# Patient Record
Sex: Female | Born: 1985 | Race: White | Hispanic: No | Marital: Married | Smoking: Former smoker
Health system: Southern US, Community
[De-identification: ages and names within clinical notes are randomized; demographics above are authoritative.]

## PROBLEM LIST (undated history)

## (undated) DIAGNOSIS — T8859XA Other complications of anesthesia, initial encounter: Secondary | ICD-10-CM

## (undated) DIAGNOSIS — K219 Gastro-esophageal reflux disease without esophagitis: Secondary | ICD-10-CM

## (undated) DIAGNOSIS — Z87440 Personal history of urinary (tract) infections: Secondary | ICD-10-CM

## (undated) DIAGNOSIS — Z803 Family history of malignant neoplasm of breast: Secondary | ICD-10-CM

## (undated) DIAGNOSIS — Z8042 Family history of malignant neoplasm of prostate: Secondary | ICD-10-CM

## (undated) DIAGNOSIS — N809 Endometriosis, unspecified: Secondary | ICD-10-CM

## (undated) DIAGNOSIS — Z9889 Other specified postprocedural states: Secondary | ICD-10-CM

## (undated) DIAGNOSIS — R112 Nausea with vomiting, unspecified: Secondary | ICD-10-CM

## (undated) DIAGNOSIS — J45909 Unspecified asthma, uncomplicated: Secondary | ICD-10-CM

## (undated) DIAGNOSIS — Z8759 Personal history of other complications of pregnancy, childbirth and the puerperium: Secondary | ICD-10-CM

## (undated) DIAGNOSIS — T4145XA Adverse effect of unspecified anesthetic, initial encounter: Secondary | ICD-10-CM

## (undated) HISTORY — PX: ENDOMETRIAL ABLATION: SHX621

## (undated) HISTORY — DX: Family history of malignant neoplasm of prostate: Z80.42

## (undated) HISTORY — PX: WISDOM TOOTH EXTRACTION: SHX21

## (undated) HISTORY — PX: MANDIBLE FRACTURE SURGERY: SHX706

## (undated) HISTORY — DX: Family history of malignant neoplasm of breast: Z80.3

## (undated) HISTORY — PX: OTHER SURGICAL HISTORY: SHX169

## (undated) HISTORY — DX: Endometriosis, unspecified: N80.9

---

## 2005-07-22 ENCOUNTER — Ambulatory Visit (HOSPITAL_COMMUNITY): Admission: AD | Admit: 2005-07-22 | Discharge: 2005-07-22 | Payer: Self-pay | Admitting: Obstetrics and Gynecology

## 2005-08-03 ENCOUNTER — Inpatient Hospital Stay (HOSPITAL_COMMUNITY): Admission: AD | Admit: 2005-08-03 | Discharge: 2005-08-04 | Payer: Self-pay | Admitting: Obstetrics and Gynecology

## 2005-08-24 ENCOUNTER — Ambulatory Visit (HOSPITAL_COMMUNITY): Admission: AD | Admit: 2005-08-24 | Discharge: 2005-08-24 | Payer: Self-pay | Admitting: Obstetrics and Gynecology

## 2005-08-31 ENCOUNTER — Inpatient Hospital Stay (HOSPITAL_COMMUNITY): Admission: AD | Admit: 2005-08-31 | Discharge: 2005-09-03 | Payer: Self-pay | Admitting: Obstetrics and Gynecology

## 2005-09-01 ENCOUNTER — Encounter (INDEPENDENT_AMBULATORY_CARE_PROVIDER_SITE_OTHER): Payer: Self-pay | Admitting: Specialist

## 2006-12-18 ENCOUNTER — Emergency Department (HOSPITAL_COMMUNITY): Admission: EM | Admit: 2006-12-18 | Discharge: 2006-12-19 | Payer: Self-pay | Admitting: Emergency Medicine

## 2007-05-10 ENCOUNTER — Encounter: Admission: RE | Admit: 2007-05-10 | Discharge: 2007-05-10 | Payer: Self-pay | Admitting: Family Medicine

## 2007-12-18 ENCOUNTER — Other Ambulatory Visit: Admission: RE | Admit: 2007-12-18 | Discharge: 2007-12-18 | Payer: Self-pay | Admitting: Obstetrics and Gynecology

## 2008-08-12 ENCOUNTER — Ambulatory Visit: Payer: Self-pay | Admitting: Obstetrics and Gynecology

## 2008-08-12 ENCOUNTER — Inpatient Hospital Stay (HOSPITAL_COMMUNITY): Admission: AD | Admit: 2008-08-12 | Discharge: 2008-08-12 | Payer: Self-pay | Admitting: Obstetrics & Gynecology

## 2008-10-22 ENCOUNTER — Inpatient Hospital Stay (HOSPITAL_COMMUNITY): Admission: AD | Admit: 2008-10-22 | Discharge: 2008-10-23 | Payer: Self-pay | Admitting: Obstetrics & Gynecology

## 2008-11-13 ENCOUNTER — Inpatient Hospital Stay (HOSPITAL_COMMUNITY): Admission: AD | Admit: 2008-11-13 | Discharge: 2008-11-13 | Payer: Self-pay | Admitting: Obstetrics and Gynecology

## 2008-11-18 ENCOUNTER — Ambulatory Visit: Payer: Self-pay | Admitting: Obstetrics and Gynecology

## 2008-11-18 ENCOUNTER — Inpatient Hospital Stay (HOSPITAL_COMMUNITY): Admission: AD | Admit: 2008-11-18 | Discharge: 2008-11-18 | Payer: Self-pay | Admitting: Obstetrics & Gynecology

## 2008-11-21 ENCOUNTER — Ambulatory Visit: Payer: Self-pay | Admitting: Advanced Practice Midwife

## 2008-11-21 ENCOUNTER — Inpatient Hospital Stay (HOSPITAL_COMMUNITY): Admission: RE | Admit: 2008-11-21 | Discharge: 2008-11-22 | Payer: Self-pay | Admitting: Obstetrics & Gynecology

## 2009-07-07 ENCOUNTER — Encounter: Admission: RE | Admit: 2009-07-07 | Discharge: 2009-07-07 | Payer: Self-pay | Admitting: Family Medicine

## 2010-04-05 NOTE — L&D Delivery Note (Addendum)
Delivery Note At 8:41 PM a healthy female was delivered via Vaginal, Spontaneous Delivery (Presentation: Right Occiput Anterior).  APGAR: 9, 9; weight 10 lb 1.6 oz (4580 g).   Placenta status: Intact, Spontaneous.  Cord: 3 vessels with the following complications: None.    Anesthesia: Epidural  Episiotomy: None Lacerations: 2nd degree Suture Repair: 3.0 vicryl rapide Est. Blood Loss (mL): <500 mL  Mom to postpartum.  Baby to nursery-stable.  Delivery supervised by [facility], CNM  HUNTER, STEPHEN 03/27/2011, 9:14 PM   I was present for above delivery and agree with note above. [facility]

## 2010-07-11 LAB — CBC
HCT: 36.4 % (ref 36.0–46.0)
Hemoglobin: 12.8 g/dL (ref 12.0–15.0)
MCHC: 35.3 g/dL (ref 30.0–36.0)
RBC: 4.15 MIL/uL (ref 3.87–5.11)
WBC: 8.6 10*3/uL (ref 4.0–10.5)

## 2010-07-11 LAB — RPR: RPR Ser Ql: NONREACTIVE

## 2010-07-14 LAB — URINALYSIS, ROUTINE W REFLEX MICROSCOPIC
Bilirubin Urine: NEGATIVE
Hgb urine dipstick: NEGATIVE
Ketones, ur: NEGATIVE mg/dL
Nitrite: NEGATIVE
Protein, ur: NEGATIVE mg/dL
Specific Gravity, Urine: 1.025 (ref 1.005–1.030)
Urobilinogen, UA: 0.2 mg/dL (ref 0.0–1.0)
pH: 6.5 (ref 5.0–8.0)

## 2010-07-14 LAB — URINE MICROSCOPIC-ADD ON

## 2010-08-21 NOTE — Group Therapy Note (Signed)
NAME:  EVERLENE, CUNNING NO.:  [id number]   MEDICAL RECORD NO.:  [id number]          PATIENT TYPE:  OIB   LOCATION:  A415                          FACILITY:  [facility]   PHYSICIAN:  Lazaro Arms, M.D.   DATE OF BIRTH:  Apr 20, 1985   DATE OF PROCEDURE:  DATE OF DISCHARGE:  08/24/2005                                   PROGRESS NOTE   DICTATED FOR: ___________   CHIEF COMPLAINT:  Lower abdominal pain.   HISTORY OF PRESENT ILLNESS:  Morgen is a 38 week Gravida 1 Para 0 who came  into labor and delivery thinking she was having contractions.  She was not  having contractions; however, she was having some irritability.  Her  urinalysis was negative.  Her vaginal exam revealed a closed cervix that was  thick.  Fetal heart rate is reactive.   IMPRESSION:  Intrauterine pregnancy at 38 weeks, uterine irritability.   PLAN:  Discharge home with instructions to hydrate herself really well and  return to labor and delivery if need be.  We also gave her 10 mg of  intramuscular morphine to quiet her uterus.      Jacklyn Shell, C.N.M.      Lazaro Arms, M.D.  Electronically Signed    FC/MEDQ  D:  08/27/2005  T:  08/27/2005  Job:  [id number]

## 2010-08-21 NOTE — H&P (Signed)
NAME:  Kirsten Fischer, Kirsten Fischer NO.:  [id number]   MEDICAL RECORD NO.:  [id number]          PATIENT TYPE:  OIB   LOCATION:  LDR3                          FACILITY:  [facility]   PHYSICIAN:  Tilda Burrow, M.D. DATE OF BIRTH:  06/29/1985   DATE OF ADMISSION:  08/02/2005  DATE OF DISCHARGE:  LH                                HISTORY & PHYSICAL   REASON FOR ADMISSION:  Pregnancy at 34 weeks and 5 days with urinary  frequency, pain, and right flank pain.   HISTORY OF PRESENT ILLNESS:  Kirsten Fischer is a 25 year old gravida 2, para 0, EDC  09/08/2005, who said she had been having urinary frequency and discomfort  for several days which has progressed into moderate to severe pain in the  right flank area.   MEDICAL HISTORY:  Positive for asthma.   SURGICAL HISTORY:  Positive for oral surgery.   ALLERGIES:  No known allergies.   FAMILY HISTORY:  Positive for diabetes, cancer.   Prenatal course uneventful up until this point.  Blood type is A positive.  UDS is negative.  Rubella is immune.  Hepatitis B surface antigen is  negative.  HIV is nonreactive.  HSV is negative.  HPV is negative.  Serology  is nonreactive.  Pap is ASCUS with negative HPV.  AFP within normal limits.  Twenty-eight-week hemoglobin 10.8, 28-week hematocrit 31.6.  One-hour  glucose was 128.  Sickle cell screen is negative.   PHYSICAL EXAMINATION:  VITAL SIGNS:  Temp 99.3, pulse about 120.  HEART:  Regular rhythm and rate.  LUNGS:  Clear to auscultation bilaterally.  ABDOMEN:  She complains of right flank discomfort.  Abdomen is gravid, soft,  nontender.  She does have CVA tenderness on the right side.   PLAN:  We are going to admit.  IV antibiotics.  Urine culture.  Pain  management.  Medication for nausea.      Kirsten Fischer, Kirsten Fischer      Tilda Burrow, M.D.  Electronically Signed    DL/MEDQ  D:  95/62/1308  T:  08/02/2005  Job:  [id number]   cc:   Francoise Schaumann. Milford Cage DO, FAAP  Fax: [phone]

## 2010-08-21 NOTE — Discharge Summary (Signed)
NAME:  Kirsten Fischer, Kirsten Fischer NO.:  [id number]   MEDICAL RECORD NO.:  [id number]          PATIENT TYPE:  INP   LOCATION:  LDR3                          FACILITY:  [facility]   PHYSICIAN:  Tilda Burrow, M.D. DATE OF BIRTH:  [birth year]   DATE OF ADMISSION:  08/02/2005  DATE OF DISCHARGE:  05/02/2007LH                                 DISCHARGE SUMMARY   ADMITTING DIAGNOSES:  1.  Pregnancy 34 weeks 5 days.  2.  Right pyelonephritis.   DISCHARGE DIAGNOSES:  1.  Pregnancy [redacted] weeks gestation, not delivered.  2.  Right pyelonephritis.   DISCHARGE MEDICATIONS:  Keflex 500 mg q.i.d. x7 days.   HOSPITAL SUMMARY:  This 25 year old female gravida 2, para 0 with urinary  frequency, discomfort several days progressing to right flank pain and  continued urinary tract symptoms.  She was admitted as described in  admitting history by [name] August 02, 2005.  Two days of hospital  care resulted in improved subjective discomfort with resolution of her CVA  tenderness and the patient was afebrile during the two days in the hospital.  She will be discharged home for follow-up in one week for urine proof of  cure and routine prenatal care.      Tilda Burrow, M.D.  Electronically Signed     JVF/MEDQ  D:  08/05/2005  T:  08/06/2005  Job:  [id number]   cc:   [facility]

## 2010-08-21 NOTE — Group Therapy Note (Signed)
NAME:  Kirsten Fischer, Kirsten Fischer NO.:  [id number]   MEDICAL RECORD NO.:  [id number]          PATIENT TYPE:  OIB   LOCATION:  LDR3                          FACILITY:  [facility]   PHYSICIAN:  Tilda Burrow, M.D. DATE OF BIRTH:  07-29-1985   DATE OF PROCEDURE:  07/22/2005  DATE OF DISCHARGE:  07/22/2005                                   PROGRESS NOTE   SUBJECTIVE:  Kirsten Fischer is about 58 weeks' gestation and came in with complaints  of sharp stabbing pain in the vaginal area.  She did have intercourse last  night for the first time in several weeks and says that the pain started  after that.   OBJECTIVE:  Her urinalysis was negative.  She is not having any  contractions.  Fetal heart rate is reactive without decelerations.   IMPRESSION:  1.  Intrauterine pregnancy at about 28 weeks.  2.  Sharp stabbing pain in the vagina secondary to an irrigated nerve.   PLAN:  Kirsten Fischer was advised to maybe have pelvic rest, at least until the pain  stops, and was reassured that everything with the pregnancy looked fine.      Kirsten Fischer, C.N.M.      Tilda Burrow, M.D.  Electronically Signed    FC/MEDQ  D:  07/22/2005  T:  07/23/2005  Job:  [id number]

## 2010-08-21 NOTE — Op Note (Signed)
NAME:  Kirsten Fischer, Kirsten Fischer NO.:  [id number]   MEDICAL RECORD NO.:  [id number]          PATIENT TYPE:  INP   LOCATION:  LDR1                          FACILITY:  [facility]   PHYSICIAN:  Tilda Burrow, M.D. DATE OF BIRTH:  1985-11-12   DATE OF PROCEDURE:  09/01/2005  DATE OF DISCHARGE:                                 OPERATIVE REPORT   PROCEDURE:  Delivery.   TIME OF DELIVERY:  1406.   DESCRIPTION OF PROCEDURE:  Kirsten Fischer under epidural anesthesia went to  complete, pushed approximately 35 minutes, had a spontaneous delivery of a  viable female infant with the assistance of a midline episiotomy. Infant's  weight was 9 pounds, Apgar's were 9 and 9. Upon delivery of head, the infant  spontaneously rotated with spontaneous delivery of shoulders without any  complications. The infant was suctioned thoroughly, nose and mouth, cord  clamped and cut, -transferrred to warmer_ for newborn care. The third stage  of labor was actively managed with 20 units pitocin and 1000 mL of D5 LR at  a rapid rate. Midline epis was repaired with three interrupted 2-0 Vicryl  sutures and 2-0 Vicryl is subcuticular to close. Uterine atony responded  well to fundal massage and Methergine 1 amp IM. Estimated blood loss was  approximately 700 mL. Infant and mother were stabilized, transferred out to  the postpartum unit in stable condition.   ADDENDUM:  The epidural catheter was removed with blue tip intact and  patient tolerated the procedure well.      Kirsten Fischer, Kirsten Fischer      Tilda Burrow, M.D.  Electronically Signed    DL/MEDQ  D:  16/01/9603  T:  09/01/2005  Job:  [id number]

## 2010-08-21 NOTE — H&P (Signed)
NAME:  Kirsten Fischer, Kirsten Fischer NO.:  [id number]   MEDICAL RECORD NO.:  [id number]          PATIENT TYPE:  INP   LOCATION:  401                           FACILITY:  [facility]   PHYSICIAN:  Tilda Burrow, M.D. DATE OF BIRTH:  1985/11/06   DATE OF ADMISSION:  08/31/2005  DATE OF DISCHARGE:  06/01/2007LH                                HISTORY & PHYSICAL   REASON FOR ADMISSION:  Pregnancy at 39 weeks for elective induction of labor  due to maternal fatigue and discomfort.  After discussing thoroughly risks  and benefits of labor with Kirsten Fischer and her husband, they both continue to  want induction of labor.   MEDICAL HISTORY:  Positive for asthma.   SURGICAL HISTORY:  Positive for oral surgery.   ALLERGIES:  SHE HAS NO KNOWN ALLERGIES.   FAMILY HISTORY:  Positive for diabetes, cancer, and Hodgkin's disease.   PRENATAL COURSE:  Uneventful.  Blood type is A positive.  UDS negative.  Rubella is immune.  Hepatitis B surface antigen negative.  HIV is negative.  HSV is negative.  HPV is negative.  Serology is nonreactive.  Pap smear is  ascus.  GC and Chlamydia are negative on both cultures.  AFP normal.  GBS is  negative at 28 weeks.  Hemoglobin 10.8 at 28 weeks, hematocrit 31.5, one-  hour glucose is 128.  Sickle cell screen was negative.   PHYSICAL EXAMINATION TODAY:  Weight is 173, initially blood pressure is  154/90, on repeat it is 130/80, she has 1+ protein in her urine and 2+  leukocytes.  There is good fetal movement.  Fundal height is 38 cm.  Fetal  heart rate is 140 strong and regular.   ASSESSMENT AND PLAN:  Today I obtained a CBC, a liver panel and a uric acid  and she is to be admitted for Foley bulb induction on May 29.      [name], Lanier Clam      Tilda Burrow, M.D.  Electronically Signed    DL/MEDQ  D:  16/01/9603  T:  08/30/2005  Job:  [id number]   cc:   Francoise Schaumann. Milford Cage DO, FAAP  Fax: [phone]

## 2010-08-21 NOTE — Op Note (Signed)
NAME:  [name], [name] NO.:  [id number]   MEDICAL RECORD NO.:  [id number]          PATIENT TYPE:  INP   LOCATION:  LDR1                          FACILITY:  [facility]   PHYSICIAN:  Lazaro Arms, M.D.   DATE OF BIRTH:  December 30, 1985   DATE OF PROCEDURE:  09/01/2005  DATE OF DISCHARGE:                                 OPERATIVE REPORT   Kirsten Fischer is gravida 1, para 0, in active phase of labor, requesting epidural.  She was placed in the sitting position.  Betadine prep was used.  1%  lidocaine was injected in the L3-L4 interspace.  Sterile drapes were placed.  A 17-gauge Tuohy needle was used.  Loss of resistance technique employed,  and epidural space was pass without difficulty.  10 cc of 0.125% bupivacaine  plain was given.  An additional 10 cc was given through the epidural  catheter, which was fed 5 cm through the epidural space easily.  Taken down  5 cm to the epidural space.  Continuous infusion of 0.125% bupivacaine with  2 mcg/cc of fentanyl was begun at 12 cc/hour.      Lazaro Arms, M.D.  Electronically Signed     LHE/MEDQ  D:  09/01/2005  T:  09/01/2005  Job:  [id number]

## 2010-08-25 LAB — HEPATITIS B SURFACE ANTIGEN: Hepatitis B Surface Ag: NEGATIVE

## 2010-08-25 LAB — ABO/RH: RH Type: POSITIVE

## 2010-08-25 LAB — CULTURE, BETA STREP (GROUP B ONLY)
Organism ID, Bacteria: POSITIVE
Organism ID, Bacteria: POSITIVE

## 2010-08-25 LAB — ANTIBODY SCREEN: Antibody Screen: NEGATIVE

## 2010-09-15 ENCOUNTER — Other Ambulatory Visit: Payer: Self-pay | Admitting: Adult Health

## 2010-09-15 ENCOUNTER — Other Ambulatory Visit (HOSPITAL_COMMUNITY)
Admission: RE | Admit: 2010-09-15 | Discharge: 2010-09-15 | Disposition: A | Discharge status: Home / Self Care | Payer: Medicaid Other | Source: Ambulatory Visit | Attending: Obstetrics and Gynecology | Admitting: Obstetrics and Gynecology

## 2010-09-15 DIAGNOSIS — Z113 Encounter for screening for infections with a predominantly sexual mode of transmission: Secondary | ICD-10-CM | POA: Insufficient documentation

## 2010-09-15 DIAGNOSIS — Z01419 Encounter for gynecological examination (general) (routine) without abnormal findings: Secondary | ICD-10-CM | POA: Insufficient documentation

## 2011-01-15 LAB — URINALYSIS, ROUTINE W REFLEX MICROSCOPIC
Bilirubin Urine: NEGATIVE
Glucose, UA: NEGATIVE
Hgb urine dipstick: NEGATIVE
Urobilinogen, UA: 0.2

## 2011-02-17 ENCOUNTER — Inpatient Hospital Stay (HOSPITAL_COMMUNITY)
Admission: AD | Admit: 2011-02-17 | Discharge: 2011-02-17 | Disposition: A | Discharge status: Home / Self Care | Payer: Medicaid Other | Source: Ambulatory Visit | Attending: Obstetrics and Gynecology | Admitting: Obstetrics and Gynecology

## 2011-02-17 ENCOUNTER — Encounter (HOSPITAL_COMMUNITY): Payer: Self-pay

## 2011-02-17 DIAGNOSIS — O99891 Other specified diseases and conditions complicating pregnancy: Secondary | ICD-10-CM | POA: Insufficient documentation

## 2011-02-17 DIAGNOSIS — O479 False labor, unspecified: Secondary | ICD-10-CM

## 2011-02-17 DIAGNOSIS — W010XXA Fall on same level from slipping, tripping and stumbling without subsequent striking against object, initial encounter: Secondary | ICD-10-CM | POA: Insufficient documentation

## 2011-02-17 DIAGNOSIS — Y92009 Unspecified place in unspecified non-institutional (private) residence as the place of occurrence of the external cause: Secondary | ICD-10-CM | POA: Insufficient documentation

## 2011-02-17 DIAGNOSIS — W19XXXA Unspecified fall, initial encounter: Secondary | ICD-10-CM

## 2011-02-17 LAB — URINALYSIS, ROUTINE W REFLEX MICROSCOPIC
Ketones, ur: NEGATIVE mg/dL
Leukocytes, UA: NEGATIVE
Protein, ur: NEGATIVE mg/dL
Specific Gravity, Urine: >1.03 — ABNORMAL HIGH (ref 1.005–1.030)

## 2011-02-17 NOTE — Progress Notes (Signed)
Pt states she fell about 1620 today on her hands and knees, pt doesn't not remember hitting her abdomen. Pt denies pain at this time

## 2011-02-17 NOTE — ED Provider Notes (Addendum)
History     Chief Complaint  Patient presents with  . Fall   HPI Mrs. Kirsten Fischer presents at 25.[redacted] weeks EGA for a fall. The fall occurred in her home this afternoon. She tripped over furniture, fell forward, and landed on her knees and outstretched hands. There was not direct contact to the abdomen. She noticed contractions while walking after the fall and was advised by [facility] to come to [facility] for evaluation. She denies any leakage of fluid or vaginal bleeding. She no longer senses uterine contractions.   OB History    Grav Para Term Preterm Abortions TAB SAB Ect Mult Living   4 2 2  0 1 0 1 0 0 2    - no complications to current pre-natal course  Past Medical History  Diagnosis Date  . No pertinent past medical history     Past Surgical History  Procedure Date  . Fatty tumor removed   . Mandible fracture surgery     Family History  Problem Relation Age of Onset  . Hypertension Father   . Asthma Sister     History  Substance Use Topics  . Smoking status: Former Games developer  . Smokeless tobacco: Not on file  . Alcohol Use: No    Allergies: No Known Allergies  Prescriptions prior to admission  Medication Sig Dispense Refill  . acetaminophen (TYLENOL) 325 MG tablet Take 325 mg by mouth every 6 (six) hours as needed. headache       . cetirizine (ZYRTEC) 10 MG tablet Take 10 mg by mouth daily. Seasonal allergies       . diphenhydrAMINE (BENADRYL) 25 MG tablet Take 25 mg by mouth every 6 (six) hours as needed. For sleep       . prenatal vitamin w/FE, FA (PRENATAL 1 + 1) 27-1 MG TABS Take 1 tablet by mouth daily.          ROS  Positive for left wrist and knee pain   Negative for shortness of breath, chest pain, headache Physical Exam   Blood pressure 118/76, pulse 86, temperature 98.5 F (36.9 C), temperature source Oral, resp. rate 16, height 5\' 8"  (1.727 m), weight 87.181 kg (192 lb 3.2 oz), SpO2 98.00%.  Physical Exam  Constitutional: She is oriented to person,  place, and time. She appears well-developed and well-nourished. No distress.  Cardiovascular: Normal rate, regular rhythm, normal heart sounds and intact distal pulses.   Respiratory: Effort normal and breath sounds normal.  GI:       Gravid, non-tender   Musculoskeletal: She exhibits no edema.       No left wrist swelling, tenderness or decreased range of motion  Neurological: She is alert and oriented to person, place, and time.     MAU Course  Procedures  MDM   Assessment and Plan  The patient will remain on tocometer and FHM for 4 hours.   Signed out to Dr. Candelaria Celeste.   Mat Carne 02/17/2011, 7:34 PM   Patient monitored for 4 hours without contraction on monitor.  Does report leaking urine when baby kicks.  Has no other complaints.  NST: Category 1 with baseline rate at 120s. Toco: no contractions seen.  #1 25 year [id number] at 35 weeks and 2 days.   #2 Fall  Patient stable for discharge to home.  Follow up next Tuesday scheduled.  Patient to call OB if has any concerns prior to that time.  Candelaria Celeste JEHIEL 02/17/2011 10:14 PM

## 2011-02-17 NOTE — Progress Notes (Signed)
Patient states she tripped and fell on her hands and knees. Did not hit the abdomen. Felt the baby have a funny movement after the fall. No bleeding or leaking fluid and has felt normal movement.

## 2011-03-18 ENCOUNTER — Encounter (HOSPITAL_COMMUNITY): Payer: Self-pay | Admitting: Obstetrics and Gynecology

## 2011-03-18 ENCOUNTER — Inpatient Hospital Stay (HOSPITAL_COMMUNITY)
Admission: AD | Admit: 2011-03-18 | Discharge: 2011-03-18 | Disposition: A | Discharge status: Home / Self Care | Payer: Medicaid Other | Source: Ambulatory Visit | Attending: Obstetrics & Gynecology | Admitting: Obstetrics & Gynecology

## 2011-03-18 DIAGNOSIS — O479 False labor, unspecified: Secondary | ICD-10-CM | POA: Insufficient documentation

## 2011-03-18 NOTE — Progress Notes (Signed)
PT SAYS SHE WAS IN OFFICE YESTERDAY-  STRIPPED MEMBRANES- VE 2 CM .   SAYS UC - HAS NOT TIMED THEM.    TOLD REG UNCOMFORTABLE- IN TRIAGE- SAYS GETTING THERE.

## 2011-03-18 NOTE — ED Provider Notes (Signed)
History    Labor check  HPI  Patient presents to MAU for labor check.  She endorses contractions, every 4-5 minutes since this afternoon.  Contractions are not painful, but she does feel increasing pelvic pressure.  Was seen in clinic yesterday afternoon and told she was 2 cm dilated.  Membranes were stripped at that time.  Since then has had bloody show.   Denies any vaginal bleeding, discharge, or gush of fluid.  Endorses good fetal movement.    OB History    Grav Para Term Preterm Abortions TAB SAB Ect Mult Living   4 2 2  0 1 0 1 0 0 2      Past Medical History  Diagnosis Date  . No pertinent past medical history     Past Surgical History  Procedure Date  . Fatty tumor removed   . Mandible fracture surgery     Family History  Problem Relation Age of Onset  . Hypertension Father   . Asthma Sister     History  Substance Use Topics  . Smoking status: Former Smoker    Types: Cigarettes  . Smokeless tobacco: Not on file  . Alcohol Use: No    Allergies: No Known Allergies  Prescriptions prior to admission  Medication Sig Dispense Refill  . acetaminophen (TYLENOL) 325 MG tablet Take 325 mg by mouth every 6 (six) hours as needed. headache       . cetirizine (ZYRTEC) 10 MG tablet Take 10 mg by mouth daily. Seasonal allergies       . phenylephrine-shark liver oil-mineral oil-petrolatum (PREPARATION H) 0.25-3-14-71.9 % rectal ointment Place rectally 2 (two) times daily as needed. Patient used this medication for hemorrhoids.       . prenatal vitamin w/FE, FA (PRENATAL 1 + 1) 27-1 MG TABS Take 1 tablet by mouth daily.        Marland Kitchen zolpidem (AMBIEN) 10 MG tablet Take 10 mg by mouth at bedtime as needed. Patient used this medication for sleep.       Marland Kitchen DISCONTD: hydrocortisone cream 1 % Apply 1 application topically 2 (two) times daily. Patient used this medication for hemorrhoids.      . diphenhydrAMINE (BENADRYL) 25 MG tablet Take 25 mg by mouth every 6 (six) hours as needed. For  sleep         ROS  Per HPI  Physical Exam   Dilation: 2 Effacement (%): 80 Cervical Position: Middle Station: -2 Presentation: Vertex Exam by:: Raelyn Mora, RN  Blood pressure 114/69, pulse 81, temperature 98.4 F (36.9 C), temperature source Oral, resp. rate 20, height 5\' 7"  (1.702 m), weight 86.807 kg (191 lb 6 oz).  Physical Exam  Constitutional: No distress.  HENT:  Head: Normocephalic and atraumatic.  Neck: Normal range of motion. Neck supple.  Cardiovascular: Normal rate, regular rhythm, normal heart sounds and intact distal pulses.  Exam reveals no gallop and no friction rub.   No murmur heard. Respiratory: Breath sounds normal. She has no wheezes. She has no rales.  GI: Soft. She exhibits no distension. There is no tenderness.       gravid  Musculoskeletal: She exhibits no edema and no tenderness.  Skin: No rash noted. No erythema.    MAU Course  Procedures NST  MDM   Assessment and Plan  1) Labor check: Patient was advised to walk for an hour and cervical exam repeated.  Exam was unchanged (2/80/-2).  Discussed labor precautions with patient.  Offered Ambien or pain medication  prior to discharge, but she politely declined.   2) Reassuring FHT 3) Disposition: Discharge home, not in active labor at this time  DE LA CRUZ,IVY 03/18/2011, 9:28 PM   Agree with above POE,DEIRDRE 03/18/2011 11:10 PM

## 2011-03-18 NOTE — Progress Notes (Signed)
"  I was seen in the office yesterday and was told I was two fingers dilated.  They stripped my membranes.  I have been having UC's since 1700 every 4-5 mins.  They were every 10 minutes before that. I had some bloody show this morning.  No leaking of fluid.  (+) FM."

## 2011-03-21 ENCOUNTER — Inpatient Hospital Stay (HOSPITAL_COMMUNITY)
Admission: AD | Admit: 2011-03-21 | Discharge: 2011-03-21 | Disposition: A | Discharge status: Home / Self Care | Payer: Medicaid Other | Source: Ambulatory Visit | Attending: Family Medicine | Admitting: Family Medicine

## 2011-03-21 ENCOUNTER — Encounter (HOSPITAL_COMMUNITY): Payer: Self-pay | Admitting: *Deleted

## 2011-03-21 DIAGNOSIS — O479 False labor, unspecified: Secondary | ICD-10-CM

## 2011-03-21 DIAGNOSIS — O471 False labor at or after 37 completed weeks of gestation: Secondary | ICD-10-CM

## 2011-03-21 HISTORY — DX: Personal history of other complications of pregnancy, childbirth and the puerperium: Z87.440

## 2011-03-21 HISTORY — DX: Personal history of other complications of pregnancy, childbirth and the puerperium: Z87.59

## 2011-03-21 NOTE — Progress Notes (Signed)
Pt c/o intermittant ctx all day, some leaking early this morning, but none since then.

## 2011-03-21 NOTE — ED Provider Notes (Signed)
Chart reviewed and agree with management and plan.  

## 2011-03-21 NOTE — ED Provider Notes (Signed)
Kirsten Fischer [id number] @[redacted]w[redacted]d   SUBJECTIVE  HPI:  Presents for labor check. She's been contracting and sleeping poorly especially since 3 days ago when membranes stripped in office. UCs stronger today but not regular. Good FM. No leakage of fluid. Had red-> brown bleeding after membranes stripped. 25yo and 25 yo at home and husband working 7 days a week contributing to her stress and fatigue.   Past Medical History  Diagnosis Date  . No pertinent past medical history   . History of kidney infection during pregnancy    Past Surgical History  Procedure Date  . Fatty tumor removed   . Mandible fracture surgery    History   Social History  . Marital Status: Married    Spouse Name: N/A    Number of Children: N/A  . Years of Education: N/A   Occupational History  . Not on file.   Social History Main Topics  . Smoking status: Former Smoker    Types: Cigarettes    Quit date: 07/20/2010  . Smokeless tobacco: Not on file  . Alcohol Use: No  . Drug Use: No  . Sexually Active: Yes   Other Topics Concern  . Not on file   Social History Narrative  . No narrative on file   No current facility-administered medications on file prior to encounter.   Current Outpatient Prescriptions on File Prior to Encounter  Medication Sig Dispense Refill  . acetaminophen (TYLENOL) 325 MG tablet Take 325 mg by mouth every 6 (six) hours as needed. headache       . cetirizine (ZYRTEC) 10 MG tablet Take 10 mg by mouth daily. Seasonal allergies       . diphenhydrAMINE (BENADRYL) 25 MG tablet Take 25 mg by mouth every 6 (six) hours as needed. For sleep       . phenylephrine-shark liver oil-mineral oil-petrolatum (PREPARATION H) 0.25-3-14-71.9 % rectal ointment Place rectally 2 (two) times daily as needed. Patient used this medication for hemorrhoids.       . prenatal vitamin w/FE, FA (PRENATAL 1 + 1) 27-1 MG TABS Take 1 tablet by mouth at bedtime.       Marland Kitchen zolpidem (AMBIEN) 10 MG tablet Take 10 mg by  mouth at bedtime as needed. Patient used this medication for sleep.        No Known Allergies  ROS: Pertinent items in HPI  OBJECTIVE  BP 116/76  Pulse 71  Resp 16  Ht 5\' 8"  (1.727 m)  Wt 86.183 kg (190 lb)  BMI 28.89 kg/m2  General appearance: alert, cooperative and fatigued Head: Normocephalic, without obvious abnormality, atraumatic Abdomen: soft, non-tender; bowel sounds normal; no masses,  no organomegaly Pelvic: cervix normal in appearance and external genitalia normal white d/c, no blood Skin: Skin color, texture, turgor normal. No rashes or lesions  Toco: occ mild UC FHR 130 baseline, reactive  ASSESSMENT False labor  PLAN  Home with labor precautions, kick counts. Keep appt at [facility] in 4 days.

## 2011-03-25 ENCOUNTER — Telehealth (HOSPITAL_COMMUNITY): Payer: Self-pay | Admitting: *Deleted

## 2011-03-25 ENCOUNTER — Encounter (HOSPITAL_COMMUNITY): Payer: Self-pay | Admitting: *Deleted

## 2011-03-25 NOTE — Telephone Encounter (Signed)
Preadmission screen  

## 2011-03-27 ENCOUNTER — Inpatient Hospital Stay (HOSPITAL_COMMUNITY)
Admission: AD | Admit: 2011-03-27 | Discharge: 2011-03-27 | Disposition: A | Discharge status: Home / Self Care | Payer: Medicaid Other | Source: Ambulatory Visit | Attending: Obstetrics and Gynecology | Admitting: Obstetrics and Gynecology

## 2011-03-27 ENCOUNTER — Inpatient Hospital Stay (HOSPITAL_COMMUNITY)
Admission: AD | Admit: 2011-03-27 | Discharge: 2011-03-29 | DRG: 775 | Disposition: A | Discharge status: Home / Self Care | Payer: Medicaid Other | Source: Ambulatory Visit | Attending: Obstetrics and Gynecology | Admitting: Obstetrics and Gynecology

## 2011-03-27 ENCOUNTER — Encounter (HOSPITAL_COMMUNITY): Payer: Self-pay | Admitting: *Deleted

## 2011-03-27 ENCOUNTER — Encounter (HOSPITAL_COMMUNITY): Payer: Self-pay | Admitting: Anesthesiology

## 2011-03-27 ENCOUNTER — Encounter (HOSPITAL_COMMUNITY): Payer: Self-pay | Admitting: Obstetrics and Gynecology

## 2011-03-27 ENCOUNTER — Inpatient Hospital Stay (HOSPITAL_COMMUNITY): Payer: Medicaid Other | Admitting: Anesthesiology

## 2011-03-27 DIAGNOSIS — O26899 Other specified pregnancy related conditions, unspecified trimester: Secondary | ICD-10-CM

## 2011-03-27 DIAGNOSIS — Z3490 Encounter for supervision of normal pregnancy, unspecified, unspecified trimester: Secondary | ICD-10-CM

## 2011-03-27 DIAGNOSIS — O99891 Other specified diseases and conditions complicating pregnancy: Secondary | ICD-10-CM | POA: Insufficient documentation

## 2011-03-27 DIAGNOSIS — O9989 Other specified diseases and conditions complicating pregnancy, childbirth and the puerperium: Secondary | ICD-10-CM

## 2011-03-27 DIAGNOSIS — N898 Other specified noninflammatory disorders of vagina: Secondary | ICD-10-CM | POA: Diagnosis present

## 2011-03-27 DIAGNOSIS — Z22330 Carrier of Group B streptococcus: Secondary | ICD-10-CM

## 2011-03-27 DIAGNOSIS — O4290 Premature rupture of membranes, unspecified as to length of time between rupture and onset of labor, unspecified weeks of gestation: Secondary | ICD-10-CM

## 2011-03-27 DIAGNOSIS — O99892 Other specified diseases and conditions complicating childbirth: Secondary | ICD-10-CM | POA: Diagnosis present

## 2011-03-27 DIAGNOSIS — A491 Streptococcal infection, unspecified site: Secondary | ICD-10-CM

## 2011-03-27 LAB — CBC
MCH: 30 pg (ref 26.0–34.0)
Platelets: 149 10*3/uL — ABNORMAL LOW (ref 150–400)
RBC: 4.36 MIL/uL (ref 3.87–5.11)
RDW: 13.5 % (ref 11.5–15.5)

## 2011-03-27 LAB — RPR: RPR Ser Ql: NONREACTIVE

## 2011-03-27 LAB — POCT FERN TEST: Fern Test: NEGATIVE

## 2011-03-27 LAB — AMNISURE RUPTURE OF MEMBRANE (ROM) NOT AT ARMC: Amnisure ROM: POSITIVE

## 2011-03-27 MED ORDER — FLEET ENEMA 7-19 GM/118ML RE ENEM: 1.0000 | ENEMA | RECTAL | Status: DC | PRN

## 2011-03-27 MED ORDER — LANOLIN HYDROUS EX OINT: TOPICAL_OINTMENT | CUTANEOUS | Status: DC | PRN

## 2011-03-27 MED ORDER — DIBUCAINE 1 % RE OINT
1.0000 "application " | TOPICAL_OINTMENT | RECTAL | Status: DC | PRN
  Administered 2011-03-28: 1 via RECTAL
  Filled 2011-03-27: qty 28

## 2011-03-27 MED ORDER — LACTATED RINGERS IV SOLN
INTRAVENOUS | Status: DC
  Administered 2011-03-27 (×3): via INTRAVENOUS

## 2011-03-27 MED ORDER — ACETAMINOPHEN 325 MG PO TABS: 650.0000 mg | ORAL_TABLET | ORAL | Status: DC | PRN

## 2011-03-27 MED ORDER — EPHEDRINE 5 MG/ML INJ: 10.0000 mg | INTRAVENOUS | Status: DC | PRN

## 2011-03-27 MED ORDER — ONDANSETRON HCL 4 MG/2ML IJ SOLN: 4.0000 mg | Freq: Four times a day (QID) | INTRAMUSCULAR | Status: DC | PRN

## 2011-03-27 MED ORDER — ZOLPIDEM TARTRATE 5 MG PO TABS: 5.0000 mg | ORAL_TABLET | Freq: Every evening | ORAL | Status: DC | PRN

## 2011-03-27 MED ORDER — PHENYLEPHRINE 40 MCG/ML (10ML) SYRINGE FOR IV PUSH (FOR BLOOD PRESSURE SUPPORT): 80.0000 ug | PREFILLED_SYRINGE | INTRAVENOUS | Status: DC | PRN

## 2011-03-27 MED ORDER — TERBUTALINE SULFATE 1 MG/ML IJ SOLN: 0.2500 mg | Freq: Once | INTRAMUSCULAR | Status: DC | PRN

## 2011-03-27 MED ORDER — NALBUPHINE HCL 10 MG/ML IJ SOLN
10.0000 mg | INTRAMUSCULAR | Status: DC | PRN
  Administered 2011-03-27: 10 mg via INTRAVENOUS

## 2011-03-27 MED ORDER — FENTANYL 2.5 MCG/ML BUPIVACAINE 1/10 % EPIDURAL INFUSION (WH - ANES)
INTRAMUSCULAR | Status: DC | PRN
Start: 2011-03-27 — End: 2011-03-28
  Administered 2011-03-27: 14 mL/h via EPIDURAL

## 2011-03-27 MED ORDER — OXYTOCIN 20 UNITS IN LACTATED RINGERS INFUSION - SIMPLE
125.0000 mL/h | Freq: Once | INTRAVENOUS | Status: AC
  Administered 2011-03-27: 999 mL/h via INTRAVENOUS

## 2011-03-27 MED ORDER — TETANUS-DIPHTH-ACELL PERTUSSIS 5-2.5-18.5 LF-MCG/0.5 IM SUSP
0.5000 mL | Freq: Once | INTRAMUSCULAR | Status: AC
Start: 2011-03-28 — End: 2011-03-28
  Administered 2011-03-28: 0.5 mL via INTRAMUSCULAR
  Filled 2011-03-27: qty 0.5

## 2011-03-27 MED ORDER — FENTANYL 2.5 MCG/ML BUPIVACAINE 1/10 % EPIDURAL INFUSION (WH - ANES)
14.0000 mL/h | INTRAMUSCULAR | Status: DC
  Filled 2011-03-27: qty 60

## 2011-03-27 MED ORDER — DIPHENHYDRAMINE HCL 25 MG PO CAPS: 25.0000 mg | ORAL_CAPSULE | Freq: Four times a day (QID) | ORAL | Status: DC | PRN

## 2011-03-27 MED ORDER — ONDANSETRON HCL 4 MG/2ML IJ SOLN: 4.0000 mg | INTRAMUSCULAR | Status: DC | PRN

## 2011-03-27 MED ORDER — ONDANSETRON HCL 4 MG PO TABS: 4.0000 mg | ORAL_TABLET | ORAL | Status: DC | PRN

## 2011-03-27 MED ORDER — CITRIC ACID-SODIUM CITRATE 334-500 MG/5ML PO SOLN: 30.0000 mL | ORAL | Status: DC | PRN

## 2011-03-27 MED ORDER — OXYCODONE-ACETAMINOPHEN 5-325 MG PO TABS: 2.0000 | ORAL_TABLET | ORAL | Status: DC | PRN

## 2011-03-27 MED ORDER — SENNOSIDES-DOCUSATE SODIUM 8.6-50 MG PO TABS
2.0000 | ORAL_TABLET | Freq: Every day | ORAL | Status: DC
  Administered 2011-03-28: 2 via ORAL

## 2011-03-27 MED ORDER — PRENATAL MULTIVITAMIN CH
1.0000 | ORAL_TABLET | Freq: Every day | ORAL | Status: DC
  Administered 2011-03-28: 1 via ORAL
  Filled 2011-03-27 (×2): qty 1

## 2011-03-27 MED ORDER — OXYTOCIN BOLUS FROM INFUSION
500.0000 mL | Freq: Once | INTRAVENOUS | Status: DC
  Filled 2011-03-27: qty 500

## 2011-03-27 MED ORDER — LIDOCAINE HCL (PF) 1 % IJ SOLN
30.0000 mL | INTRAMUSCULAR | Status: DC | PRN
  Filled 2011-03-27: qty 30

## 2011-03-27 MED ORDER — BENZOCAINE-MENTHOL 20-0.5 % EX AERO
INHALATION_SPRAY | CUTANEOUS | Status: AC
  Administered 2011-03-28: 1 via TOPICAL
  Filled 2011-03-27: qty 56

## 2011-03-27 MED ORDER — SIMETHICONE 80 MG PO CHEW: 80.0000 mg | CHEWABLE_TABLET | ORAL | Status: DC | PRN

## 2011-03-27 MED ORDER — LACTATED RINGERS IV SOLN: 500.0000 mL | Freq: Once | INTRAVENOUS | Status: DC

## 2011-03-27 MED ORDER — NALBUPHINE SYRINGE 5 MG/0.5 ML
10.0000 mg | INJECTION | INTRAMUSCULAR | Status: DC | PRN
  Filled 2011-03-27: qty 1

## 2011-03-27 MED ORDER — NALBUPHINE SYRINGE 5 MG/0.5 ML
INJECTION | INTRAMUSCULAR | Status: AC
  Administered 2011-03-27: 10 mg via INTRAVENOUS
  Filled 2011-03-27: qty 1

## 2011-03-27 MED ORDER — PENICILLIN G POTASSIUM 5000000 UNITS IJ SOLR
2.5000 10*6.[IU] | INTRAVENOUS | Status: DC
  Administered 2011-03-27 (×2): 2.5 10*6.[IU] via INTRAVENOUS
  Filled 2011-03-27 (×4): qty 2.5

## 2011-03-27 MED ORDER — PHENYLEPHRINE 40 MCG/ML (10ML) SYRINGE FOR IV PUSH (FOR BLOOD PRESSURE SUPPORT)
80.0000 ug | PREFILLED_SYRINGE | INTRAVENOUS | Status: DC | PRN
  Filled 2011-03-27: qty 5

## 2011-03-27 MED ORDER — DIPHENHYDRAMINE HCL 50 MG/ML IJ SOLN: 12.5000 mg | INTRAMUSCULAR | Status: DC | PRN

## 2011-03-27 MED ORDER — OXYTOCIN 20 UNITS IN LACTATED RINGERS INFUSION - SIMPLE
125.0000 mL/h | INTRAVENOUS | Status: DC
  Administered 2011-03-27: 6 mL/h via INTRAVENOUS
  Filled 2011-03-27: qty 1000

## 2011-03-27 MED ORDER — LIDOCAINE HCL 1.5 % IJ SOLN
INTRAMUSCULAR | Status: DC | PRN
  Administered 2011-03-27 (×2): 4 mL via EPIDURAL

## 2011-03-27 MED ORDER — IBUPROFEN 600 MG PO TABS: 600.0000 mg | ORAL_TABLET | Freq: Four times a day (QID) | ORAL | Status: DC | PRN

## 2011-03-27 MED ORDER — IBUPROFEN 600 MG PO TABS
600.0000 mg | ORAL_TABLET | Freq: Four times a day (QID) | ORAL | Status: DC
  Administered 2011-03-28 – 2011-03-29 (×6): 600 mg via ORAL
  Filled 2011-03-27 (×6): qty 1

## 2011-03-27 MED ORDER — LACTATED RINGERS IV SOLN
500.0000 mL | INTRAVENOUS | Status: DC | PRN
  Administered 2011-03-27: 1000 mL via INTRAVENOUS

## 2011-03-27 MED ORDER — EPHEDRINE 5 MG/ML INJ
10.0000 mg | INTRAVENOUS | Status: DC | PRN
  Filled 2011-03-27: qty 4

## 2011-03-27 MED ORDER — OXYCODONE-ACETAMINOPHEN 5-325 MG PO TABS
1.0000 | ORAL_TABLET | ORAL | Status: DC | PRN
  Administered 2011-03-28: 1 via ORAL
  Filled 2011-03-27: qty 1

## 2011-03-27 MED ORDER — DEXTROSE 5 % IV SOLN
5.0000 10*6.[IU] | Freq: Once | INTRAVENOUS | Status: AC
  Administered 2011-03-27: 5 10*6.[IU] via INTRAVENOUS
  Filled 2011-03-27: qty 5

## 2011-03-27 MED ORDER — BENZOCAINE-MENTHOL 20-0.5 % EX AERO
1.0000 "application " | INHALATION_SPRAY | CUTANEOUS | Status: DC | PRN
  Administered 2011-03-28: 1 via TOPICAL

## 2011-03-27 MED ORDER — WITCH HAZEL-GLYCERIN EX PADS
1.0000 "application " | MEDICATED_PAD | CUTANEOUS | Status: DC | PRN
  Administered 2011-03-28: 1 via TOPICAL

## 2011-03-27 NOTE — Anesthesia Preprocedure Evaluation (Signed)
Anesthesia Evaluation  Patient identified by MRN, date of birth, ID band Patient awake    Reviewed: Allergy & Precautions, H&P , Patient's Chart, lab work & pertinent test results  Airway Mallampati: II TM Distance: >3 FB Neck ROM: full    Dental No notable dental hx. (+) Teeth Intact   Pulmonary neg pulmonary ROS,  clear to auscultation  Pulmonary exam normal       Cardiovascular neg cardio ROS regular Normal    Neuro/Psych Negative Neurological ROS  Negative Psych ROS   GI/Hepatic negative GI ROS, Neg liver ROS,   Endo/Other  Negative Endocrine ROS  Renal/GU negative Renal ROS  Genitourinary negative   Musculoskeletal   Abdominal Normal abdominal exam  (+)   Peds  Hematology negative hematology ROS (+)   Anesthesia Other Findings   Reproductive/Obstetrics (+) Pregnancy                           Anesthesia Physical Anesthesia Plan  ASA: II  Anesthesia Plan: Epidural   Post-op Pain Management:    Induction:   Airway Management Planned:   Additional Equipment:   Intra-op Plan:   Post-operative Plan:   Informed Consent: I have reviewed the patients History and Physical, chart, labs and discussed the procedure including the risks, benefits and alternatives for the proposed anesthesia with the patient or authorized representative who has indicated his/her understanding and acceptance.     Plan Discussed with: Anesthesiologist and Surgeon  Anesthesia Plan Comments:         Anesthesia Quick Evaluation  

## 2011-03-27 NOTE — Anesthesia Procedure Notes (Signed)
Epidural Patient location during procedure: OB Start time: 03/27/2011 7:15 PM  Staffing Anesthesiologist: Eilah Common A. Performed by: anesthesiologist   Preanesthetic Checklist Completed: patient identified, site marked, surgical consent, pre-op evaluation, timeout performed, IV checked, risks and benefits discussed and monitors and equipment checked  Epidural Patient position: sitting Prep: site prepped and draped and DuraPrep Patient monitoring: continuous pulse ox and blood pressure Approach: midline Injection technique: LOR air  Needle:  Needle type: Tuohy  Needle gauge: 17 G Needle length: 9 cm Needle insertion depth: 4 cm Catheter type: closed end flexible Catheter size: 19 Gauge Catheter at skin depth: 9 cm Test dose: negative and 1.5% lidocaine  Assessment Events: blood not aspirated, injection not painful, no injection resistance, negative IV test and no paresthesia  Additional Notes Patient is more comfortable after epidural dosed. Please see RN's note for documentation of vital signs and FHR which are stable.

## 2011-03-27 NOTE — H&P (Signed)
Kirsten Fischer is a 25 y.o. female presenting for PROM x 11 hrs.  HPI: Onset trickling clear fluid at 1100 on 03/26/11. Seen in MAU and had R/O SROM, went home to sleep and woke with underclothes and bed soaked at 0700. Continues to trickle clear fluid. No bleeding, antecedent intercourse, irritative vaginitis, UTI sx.   Maternal Medical History:  Reason for admission: Reason for admission: rupture of membranes.  Contractions: Onset was 6-12 hours ago.   Frequency: irregular.   Duration is approximately 40 seconds.   Perceived severity is mild.    Fetal activity: Perceived fetal activity is normal.   Last perceived fetal movement was within the past hour.    Prenatal complications: No bleeding.   Prenatal Complications - Diabetes: none.    OB History    Grav Para Term Preterm Abortions TAB SAB Ect Mult Living   4 2 2  0 1 0 1 0 0 2     Past Medical History  Diagnosis Date  . No pertinent past medical history   . History of kidney infection during pregnancy    Past Surgical History  Procedure Date  . Fatty tumor removed   . Mandible fracture surgery    Family History: family history includes Asthma in her sister; Diabetes in her maternal grandmother and paternal grandfather; and Hypertension in her father.  There is no history of Anesthesia problems, and Hypotension, and Malignant hyperthermia, and Pseudochol deficiency, . Social History:  reports that she quit smoking about 8 months ago. Her smoking use included Cigarettes. She does not have any smokeless tobacco history on file. She reports that she does not drink alcohol or use illicit drugs.  Review of Systems  Constitutional: Negative for fever.  Eyes: Negative for blurred vision.  Respiratory: Negative for cough.   Cardiovascular: Negative for chest pain.  Genitourinary: Negative for dysuria.  Skin: Negative for rash.  Neurological: Negative for weakness and headaches.  Psychiatric/Behavioral: Negative for  depression.      Blood pressure 120/66, pulse 75, temperature 98.4 F (36.9 C), temperature source Oral, resp. rate 18, height 5\' 8"  (1.727 m), weight 85.821 kg (189 lb 3.2 oz). Maternal Exam:  Uterine Assessment: Contraction strength is mild.  Contraction duration is 30 seconds. Contraction frequency is irregular.   Abdomen: Estimated fetal weight is 9#.   Fetal presentation: vertex  Introitus: Normal vulva. Vagina is positive for vaginal discharge.  Ferning test: negative.  Nitrazine test: negative. Amniotic fluid character: clear. Amnisure positive  Pelvis: adequate for delivery.   Cervix: Cervix evaluated by sterile speculum exam and digital exam.    Bedside US by me: Normal AFV Physical Exam  Constitutional: She is oriented to person, place, and time. She appears well-developed and well-nourished. No distress.  HENT:  Head: Normocephalic and atraumatic.  Eyes: EOM are normal.  Neck: Neck supple. No thyromegaly present.  Cardiovascular: Normal rate, regular rhythm and normal heart sounds.   Respiratory: Effort normal and breath sounds normal.  GI: Soft. There is no tenderness.  Genitourinary: Uterus normal. Vaginal discharge found.       Thin mucusy discharge  Musculoskeletal: Normal range of motion.  Neurological: She is alert and oriented to person, place, and time. She has normal reflexes.  Skin: Skin is warm and dry.  Psychiatric: She has a normal mood and affect.    Cx 2.5/70/-2 vtx  FHR 135 baseline, moderate variability, pos accels, no decels  Toco UCs q 4-12 min, 30-40 sec  Prenatal labs: ABO, Rh:  A/Positive/-- (05/22 0000) Antibody: Negative (05/22 0000) Rubella: Immune (05/22 0000) RPR: Nonreactive (05/22 0000)  HBsAg: Negative (05/22 0000)  HIV: Non-reactive (05/22 0000)  GBS: Positive, Positive (05/22 0000)   Results for orders placed during the hospital encounter of 03/27/11 (from the past 24 hour(s))  AMNISURE RUPTURE OF MEMBRANE (ROM)      Status: Normal   Collection Time   03/27/11 10:01 AM      Component Value Range   Amnisure ROM POSITIVE     Assessment/Plan: [id number] at [redacted]w[redacted]d by good criteria PROM, not in labor, favorable cx +GBS bacteruria in pregnancy Cat 1 FHR  Plan admit for pitocin IOL and PCN   Zawadi Aplin 03/27/2011, 11:06 AM

## 2011-03-27 NOTE — ED Provider Notes (Signed)
Kirsten Fischer [id number] @[redacted]w[redacted]d  Chief Complaint  Patient presents with  . Rupture of Membranes    SUBJECTIVE  HPI: Trickling fluid began at 2300 03/26/11. Seen here today at 0200 for r/o SROM, discharged, then went to sleep and woke at 0700 with clothes and bedding wet. Has continued to trickle clear fluid since then. Having uncomfortable irregular contractions. Denies antecedenet intercourse, dysuria, frequency, urgency, incontinence episodes. No bleeding. Good FM. Has IOL scheduled for 04/01/11.  Past Medical History  Diagnosis Date  . No pertinent past medical history   . History of kidney infection during pregnancy    Past Surgical History  Procedure Date  . Fatty tumor removed   . Mandible fracture surgery    History   Social History  . Marital Status: Married    Spouse Name: N/A    Number of Children: N/A  . Years of Education: N/A   Occupational History  . Not on file.   Social History Main Topics  . Smoking status: Former Smoker    Types: Cigarettes    Quit date: 07/20/2010  . Smokeless tobacco: Not on file  . Alcohol Use: No  . Drug Use: No  . Sexually Active: Yes   Other Topics Concern  . Not on file   Social History Narrative  . No narrative on file   No current facility-administered medications on file prior to encounter.   Current Outpatient Prescriptions on File Prior to Encounter  Medication Sig Dispense Refill  . phenylephrine-shark liver oil-mineral oil-petrolatum (PREPARATION H) 0.25-3-14-71.9 % rectal ointment Place rectally 2 (two) times daily as needed. Patient used this medication for hemorrhoids.        No Known Allergies  ROS: Pertinent items in HPI  OBJECTIVE  BP 120/66  Pulse 75  Temp(Src) 98.4 F (36.9 C) (Oral)  Resp 18  Ht 5\' 8"  (1.727 m)  Wt 85.821 kg (189 lb 3.2 oz)  BMI 28.77 kg/m2  Bedside US by me: normal AFV, generalized distribution Physical Exam  Constitutional: She is well-developed, well-nourished, and in  no distress. No distress.  HENT:  Head: Normocephalic.  Cardiovascular: Normal rate.   Pulmonary/Chest: Effort normal.  Abdominal: Soft. There is no tenderness.  Genitourinary: Vaginal discharge found.       SSE: scant pooling of thin and mucusy discharge. Fern neg. Cx 2/70/-3, membranes palpated over head, not bulging   Results for orders placed during the hospital encounter of 03/27/11 (from the past 24 hour(s))  AMNISURE RUPTURE OF MEMBRANE (ROM)     Status: Normal   Collection Time   03/27/11 10:01 AM      Component Value Range   Amnisure ROM POSITIVE     FHR 135 reactive UCs mild q 4-12  ASSESSMENT  P2 withPROM at [redacted]w[redacted]d, cx favorable, not in labor +GBS Cat 1 FHR   PLAN  Admit, pitocin augmentation and PCN

## 2011-03-27 NOTE — Progress Notes (Signed)
Transferred to room 113 via wheelchair with husband at side stable

## 2011-03-27 NOTE — Progress Notes (Signed)
   Subjective: Pt reports comfort after epidural.  No questions or concerns.  Objective: BP 102/58  Pulse 74  Temp(Src) 98.5 F (36.9 C) (Oral)  Resp 20  Ht 5\' 8"  (1.727 m)  Wt 85.73 kg (189 lb)  BMI 28.74 kg/m2  SpO2 99%      FHT:  FHR: 110's bpm, variability: moderate,  accelerations:  Present,  decelerations:  Absent UC:   regular, every 2-3 minutes SVE:   Dilation: 5 Effacement (%): 90 Station: 0 Exam by:: Ferne Coe RN  Labs: Lab Results  Component Value Date   WBC 9.3 03/27/2011   HGB 13.1 03/27/2011   HCT 37.5 03/27/2011   MCV 86.0 03/27/2011   PLT 149* 03/27/2011    Assessment / Plan: Induction of labor due to PROM,  progressing well on pitocin  Labor: Progressing normally Preeclampsia:  n/a Fetal Wellbeing:  Category I Pain Control:  Epidural I/D:  n/a Anticipated MOD:  NSVD  [facility] 03/27/2011, 8:59 PM

## 2011-03-27 NOTE — Progress Notes (Signed)
Pt was here last night with complaints of leaking of fluid. Pt was sent home due to negative fern testing. Pt returns to MAU this morning with concerns of continuous fluid leaking. Pt is G4P2 at [redacted]w[redacted]d

## 2011-03-27 NOTE — Progress Notes (Signed)
Pt states, " At 11 pm I started having trickels of water. I've had contractions all day and they have gotten strong since the leaking started."

## 2011-03-27 NOTE — Progress Notes (Signed)
Kirsten Fischer CNM in about 0200 to do spec exam to r/o srom. Spec exam done and pt tol well.

## 2011-03-27 NOTE — ED Provider Notes (Signed)
Attestation of Attending Supervision of Advanced Practitioner: Evaluation and management procedures were performed by the PA/NP/CNM/OB Fellow under my supervision/collaboration. Chart reviewed and agree with management and plan.  Geoffry Bannister V 03/27/2011 6:33 AM    

## 2011-03-27 NOTE — Progress Notes (Signed)
   Kirsten Fischer is a 25 y.o. [phone] at [redacted]w[redacted]d  admitted for PROM  Subjective:   Objective: BP 109/69  Pulse 74  Temp(Src) 98.9 F (37.2 C) (Oral)  Resp 16  Ht 5\' 8"  (1.727 m)  Wt 85.73 kg (189 lb)  BMI 28.74 kg/m2    FHT:  FHR: 130 bpm, variability: moderate,  accelerations:  Present,  decelerations:  Absent UC:   regular, every 4 minutes SVE:   Dilation: 2 Effacement (%): 70 Station: -2 Exam by:: Kirsten Coe RN  Labs: Lab Results  Component Value Date   WBC 9.3 03/27/2011   HGB 13.1 03/27/2011   HCT 37.5 03/27/2011   MCV 86.0 03/27/2011   PLT 149* 03/27/2011    Assessment / Plan: Induction of labor due to PROM,  progressing well on pitocin  Labor: Progressing on Pitocin, will continue to increase then AROM Fetal Wellbeing:  Category I Pain Control:  Labor support without medications Anticipated MOD:  NSVD  Kirsten Fischer 03/27/2011, 2:09 PM

## 2011-03-27 NOTE — Progress Notes (Signed)
Pt reports being here last night though her water broke was sent hone. Had a large gush of fluid out this morning and still leaking clear fluid. Pt reports mild infrequent contractions.

## 2011-03-27 NOTE — Progress Notes (Signed)
SVD of viable female 

## 2011-03-27 NOTE — ED Notes (Signed)
Wynelle Bourgeois CNM notified of pt's admission and status. Aware of ? SROM at 2300. Will come see pt and do spec exam.

## 2011-03-27 NOTE — ED Provider Notes (Signed)
History     Chief Complaint  Patient presents with  . Rupture of Membranes   HPI This is a 25 y.o. who presents at [redacted]w[redacted]d with report of leaking small amt of fluid after her shower. No further leaking. Has irregular contractions. No bleeding. + fetal movment.   OB History    Grav Para Term Preterm Abortions TAB SAB Ect Mult Living   4 2 2  0 1 0 1 0 0 2      Past Medical History  Diagnosis Date  . No pertinent past medical history   . History of kidney infection during pregnancy     Past Surgical History  Procedure Date  . Fatty tumor removed   . Mandible fracture surgery     Family History  Problem Relation Age of Onset  . Hypertension Father   . Asthma Sister   . Diabetes Paternal Grandfather   . Diabetes Maternal Grandmother   . Anesthesia problems Neg Hx   . Hypotension Neg Hx   . Malignant hyperthermia Neg Hx   . Pseudochol deficiency Neg Hx     History  Substance Use Topics  . Smoking status: Former Smoker    Types: Cigarettes    Quit date: 07/20/2010  . Smokeless tobacco: Not on file  . Alcohol Use: No    Allergies: No Known Allergies  Prescriptions prior to admission  Medication Sig Dispense Refill  . phenylephrine-shark liver oil-mineral oil-petrolatum (PREPARATION H) 0.25-3-14-71.9 % rectal ointment Place rectally 2 (two) times daily as needed. Patient used this medication for hemorrhoids.       . Prenatal Vit-Fe Fumarate-FA (PRENATAL MULTIVITAMIN) TABS Take 1 tablet by mouth daily.          ROS As above  Physical Exam   Blood pressure 121/79, pulse 89, temperature 98.7 F (37.1 C), temperature source Oral, resp. rate 20, height 5\' 8"  (1.727 m), weight 86.637 kg (191 lb).  Physical Exam No apparent distress, appears comfortable Resp unlabored Abdomen gravid, soft nontender EFM Reactive with occasional UCs Sterile Speculum Exam:  No pooling, scant creamy d/c, Neg Ferning Cx tight 2/80/-2/vtx  MAU Course  Procedures  Assessment and  Plan  A:  IUP at post term      IOL scheduled for 12/27      Leaking fluid at home, no evidence of ruptured membranes      Reassuring fetal status P:  D/C home      Labor precautions  [facility] 03/27/2011, 2:07 AM

## 2011-03-28 MED ORDER — IBUPROFEN 600 MG PO TABS: 600.0000 mg | ORAL_TABLET | Freq: Four times a day (QID) | ORAL | Status: AC

## 2011-03-28 NOTE — Discharge Summary (Signed)
Agree with above note.  Kirsten Fischer 03/28/2011 7:50 AM   

## 2011-03-28 NOTE — Discharge Summary (Signed)
Obstetric Discharge Summary Reason for Admission: rupture of membranes Prenatal Procedures: NST and ultrasound Intrapartum Procedures: spontaneous vaginal delivery Postpartum Procedures: none Complications-Operative and Postpartum: 2nd degree perineal laceration Hemoglobin  Date Value Range Status  03/27/2011 13.1  12.0-15.0 (g/dL) Final     HCT  Date Value Range Status  03/27/2011 37.5  36.0-46.0 (%) Final    Discharge Diagnoses: Term Pregnancy-delivered  Discharge Information: Date: 03/28/2011 Activity: pelvic rest Diet: routine Medications: Ibuprofen Condition: stable Instructions: Postpartum handout provided. Discharge to: home Follow-up Information    Follow up with [facility] in 6 weeks.   Contact information:   [address] [place] [address]          Newborn Data: Live born female  Birth Weight: 10 lb 1.6 oz (4580 g) APGAR: 9, 9  Home with mother.  [facility] 03/28/2011, 7:44 AM

## 2011-03-28 NOTE — Anesthesia Postprocedure Evaluation (Signed)
  Anesthesia Post-op Note  Patient: Kirsten Fischer  Procedure(s) Performed: * No procedures listed *  Patient Location: Mother/Baby  Anesthesia Type: Epidural  Level of Consciousness: awake, alert  and oriented  Airway and Oxygen Therapy: Patient Spontanous Breathing  Post-op Assessment: Patient's Cardiovascular Status Stable and Respiratory Function Stable  Post-op Vital Signs: Reviewed and stable  Complications: No apparent anesthesia complications

## 2011-03-28 NOTE — Progress Notes (Signed)
Post Partum Day 1 Subjective: no complaints, up ad lib, voiding, tolerating PO and breastfeeding well; desires discharge home today if possible.  Objective: Blood pressure 97/60, pulse 65, temperature 98.6 F (37 C), temperature source Oral, resp. rate 20, height 5\' 8"  (1.727 m), weight 85.73 kg (189 lb), SpO2 100.00%, unknown if currently breastfeeding.  Physical Exam:  General: alert, cooperative and appears stated age Lochia: appropriate Uterine Fundus: firm; 2 below umbilicus Incision: n/a DVT Evaluation: No evidence of DVT seen on physical exam. Negative Homan's sign.   Basename 03/27/11 1050  HGB 13.1  HCT 37.5    Assessment/Plan: Discharge home, Breastfeeding and Contraception vasectomy by partner.   LOS: 1 day   [facility] 03/28/2011, 7:38 AM

## 2011-03-28 NOTE — H&P (Signed)
Agree with above note.  Kirsten Fischer 03/28/2011 7:50 AM

## 2011-03-28 NOTE — ED Provider Notes (Signed)
Agree with above note.  Kirsten Fischer 03/28/2011 7:50 AM   

## 2011-03-29 NOTE — Progress Notes (Signed)
Post Partum Day 2 Subjective: no complaints, up ad lib, voiding and tolerating PO  Objective: Blood pressure 107/60, pulse 82, temperature 97.9 F (36.6 C), temperature source Oral, resp. rate 18, height 5\' 8"  (1.727 m), weight 85.73 kg (189 lb), SpO2 100.00%, unknown if currently breastfeeding.  Physical Exam:  General: alert, cooperative, appears stated age and no distress Lochia: appropriate Uterine Fundus: firm Incision: n/a DVT Evaluation: No evidence of DVT seen on physical exam. Negative Homan's sign. No cords or calf tenderness. No significant calf/ankle edema.   Basename 03/27/11 1050  HGB 13.1  HCT 37.5    Assessment/Plan: Discharge home   LOS: 2 days   [name] 03/29/2011, 7:59 AM

## 2011-03-29 NOTE — Progress Notes (Signed)
UR chart review completed.  

## 2011-04-01 ENCOUNTER — Inpatient Hospital Stay (HOSPITAL_COMMUNITY): Admission: RE | Admit: 2011-04-01 | Payer: Medicaid Other | Source: Ambulatory Visit

## 2012-06-22 ENCOUNTER — Telehealth: Payer: Self-pay | Admitting: Physician Assistant

## 2012-06-22 ENCOUNTER — Telehealth: Payer: Self-pay | Admitting: Nurse Practitioner

## 2012-06-22 NOTE — Telephone Encounter (Signed)
Cut leg infected went to [facility] no better

## 2012-06-22 NOTE — Telephone Encounter (Signed)
Please make appt

## 2012-06-22 NOTE — Telephone Encounter (Signed)
Thyroid check

## 2012-06-29 ENCOUNTER — Ambulatory Visit (INDEPENDENT_AMBULATORY_CARE_PROVIDER_SITE_OTHER): Payer: Self-pay | Admitting: Nurse Practitioner

## 2012-06-29 ENCOUNTER — Encounter: Payer: Self-pay | Admitting: Nurse Practitioner

## 2012-06-29 VITALS — BP 117/72 | HR 80 | Temp 97.7°F | Ht 68.0 in | Wt 170.0 lb

## 2012-06-29 DIAGNOSIS — R5383 Other fatigue: Secondary | ICD-10-CM

## 2012-06-29 DIAGNOSIS — R5381 Other malaise: Secondary | ICD-10-CM

## 2012-06-29 LAB — COMPLETE METABOLIC PANEL WITH GFR
AST: 15 U/L (ref 0–37)
Albumin: 4.7 g/dL (ref 3.5–5.2)
Alkaline Phosphatase: 43 U/L (ref 39–117)
Glucose, Bld: 95 mg/dL (ref 70–99)
Potassium: 3.8 mEq/L (ref 3.5–5.3)
Sodium: 141 mEq/L (ref 135–145)
Total Protein: 7.2 g/dL (ref 6.0–8.3)

## 2012-06-29 NOTE — Patient Instructions (Signed)

## 2012-06-29 NOTE — Progress Notes (Signed)
  Subjective:    Patient ID: Kirsten Fischer, female    DOB: 1985/10/11, 27 y.o.   MRN: [id number]  HPI Patient in c/o fatigue. Has been going on for about 15 months. Seems to be worsening. Patient says her hair is falling out and menses is irregular. Also C/o weight gain    Review of Systems  Constitutional: Positive for activity change (doesnt feel like doing anything) and fatigue.  HENT: Negative.   Eyes: Negative.   Respiratory: Negative.   Cardiovascular: Negative for chest pain and palpitations.  Gastrointestinal: Positive for constipation (normal for her).  Endocrine: Positive for cold intolerance and heat intolerance.  Genitourinary: Positive for menstrual problem (irregular).  Musculoskeletal: Negative.   Skin: Negative.   Neurological: Negative.   Hematological: Negative.   Psychiatric/Behavioral: Positive for sleep disturbance (wakes up frequently). Negative for suicidal ideas.       Objective:   Physical Exam  Constitutional: She is oriented to person, place, and time. She appears well-developed and well-nourished.  HENT:  Head: Normocephalic.  Nose: Nose normal.  Mouth/Throat: Oropharynx is clear and moist.  Eyes: EOM are normal. Pupils are equal, round, and reactive to light.  Neck: Normal range of motion. Neck supple. No thyromegaly present.  Cardiovascular: Normal rate, regular rhythm and normal heart sounds.   Pulmonary/Chest: Effort normal and breath sounds normal.  Lymphadenopathy:    She has no cervical adenopathy.  Neurological: She is alert and oriented to person, place, and time.  Skin: Skin is warm and dry.  Psychiatric: She has a normal mood and affect. Her behavior is normal. Judgment and thought content normal.   BP 117/72  Pulse 80  Temp(Src) 97.7 F (36.5 C) (Oral)  Ht 5\' 8"  (1.727 m)  Wt 170 lb (77.111 kg)  BMI 25.85 kg/m2  LMP 06/17/2012  Breastfeeding? No        Assessment & Plan:  1. Other malaise and fatigue Labs pending- Will  discuss further testing after get labs back - Thyroid Panel With TSH - Anemia panel 7 - COMPLETE METABOLIC PANEL WITH GFR May do sleep study if all labs are normal- Pt says she snores  Mary-Margaret Daphine Deutscher, FNP

## 2012-06-30 ENCOUNTER — Telehealth: Payer: Self-pay | Admitting: Family Medicine

## 2012-06-30 LAB — THYROID PANEL WITH TSH
Free Thyroxine Index: 2.6 (ref 1.0–3.9)
T3 Uptake: 32.3 % (ref 22.5–37.0)
TSH: 1.15 u[IU]/mL (ref 0.350–4.500)

## 2012-06-30 LAB — ANEMIA PANEL 7
Folate: 17.1 ng/mL
Iron: 109 ug/dL (ref 42–145)
MCH: 27.9 pg (ref 26.0–34.0)
Platelets: 262 10*3/uL (ref 150–400)
RBC: 4.81 MIL/uL (ref 3.87–5.11)
Retic Ct Pct: 0.8 % (ref 0.4–2.3)
Vitamin B-12: 589 pg/mL (ref 211–911)

## 2012-06-30 NOTE — Telephone Encounter (Signed)
Patient aware.

## 2013-01-12 ENCOUNTER — Encounter: Payer: Self-pay | Admitting: Obstetrics and Gynecology

## 2013-01-18 ENCOUNTER — Encounter: Payer: Self-pay | Admitting: Obstetrics and Gynecology

## 2013-01-23 ENCOUNTER — Ambulatory Visit (INDEPENDENT_AMBULATORY_CARE_PROVIDER_SITE_OTHER): Payer: BC Managed Care – PPO | Admitting: Gynecology

## 2013-01-23 ENCOUNTER — Encounter: Payer: Self-pay | Admitting: Gynecology

## 2013-01-23 VITALS — BP 112/80 | HR 66 | Resp 18 | Ht 68.0 in | Wt 168.0 lb

## 2013-01-23 DIAGNOSIS — IMO0002 Reserved for concepts with insufficient information to code with codable children: Secondary | ICD-10-CM

## 2013-01-23 DIAGNOSIS — Z124 Encounter for screening for malignant neoplasm of cervix: Secondary | ICD-10-CM

## 2013-01-23 DIAGNOSIS — N920 Excessive and frequent menstruation with regular cycle: Secondary | ICD-10-CM

## 2013-01-23 DIAGNOSIS — Z01419 Encounter for gynecological examination (general) (routine) without abnormal findings: Secondary | ICD-10-CM | POA: Insufficient documentation

## 2013-01-23 DIAGNOSIS — N816 Rectocele: Secondary | ICD-10-CM

## 2013-01-23 DIAGNOSIS — Z0000 Encounter for general adult medical examination without abnormal findings: Secondary | ICD-10-CM

## 2013-01-23 DIAGNOSIS — N8111 Cystocele, midline: Secondary | ICD-10-CM

## 2013-01-23 LAB — POCT URINALYSIS DIPSTICK
Blood, UA: 2
pH, UA: 5

## 2013-01-23 NOTE — Patient Instructions (Signed)
2 aleve twice a day starting 36h before cycle may decrease flow and pain associated with menses  Endometrial Ablation Endometrial ablation removes the lining of the uterus (endometrium). It is usually a same day, outpatient treatment. Ablation helps avoid major surgery (such as a hysterectomy). A hysterectomy is removal of the cervix and uterus. Endometrial ablation has less risk and complications, has a shorter recovery period and is less expensive. After endometrial ablation, most women will have little or no menstrual bleeding. You may not keep your fertility. Pregnancy is no longer likely after this procedure but if you are pre-menopausal, you still need to use a reliable method of birth control following the procedure because pregnancy can occur. REASONS TO HAVE THE PROCEDURE MAY INCLUDE:  Heavy periods.  Bleeding that is causing anemia.  Anovulatory bleeding, very irregular, bleeding.  Bleeding submucous fibroids (on the lining inside the uterus) if they are smaller than 3 centimeters. REASONS NOT TO HAVE THE PROCEDURE MAY INCLUDE:  You wish to have more children.  You have a pre-cancerous or cancerous problem. The cause of any abnormal bleeding must be diagnosed before having the procedure.  You have pain coming from the uterus.  You have a submucus fibroid larger than 3 centimeters.  You recently had a baby.  You recently had an infection in the uterus.  You have a severe retro-flexed, tipped uterus and cannot insert the instrument to do the ablation.  You had a Cesarean section or deep major surgery on the uterus.  The inner cavity of the uterus is too large for the endometrial ablation instrument. RISKS AND COMPLICATIONS   Perforation of the uterus.  Bleeding.  Infection of the uterus, bladder or vagina.  Injury to surrounding organs.  Cutting the cervix.  An air bubble to the lung (air embolus).  Pregnancy following the procedure.  Failure of the procedure  to help the problem requiring hysterectomy.  Decreased ability to diagnose cancer in the lining of the uterus. BEFORE THE PROCEDURE  The lining of the uterus must be tested to make sure there is no pre-cancerous or cancer cells present.  Medications may be given to make the lining of the uterus thinner.  Ultrasound may be used to evaluate the size and look for abnormalities of the uterus.  Future pregnancy is not desired. PROCEDURE  There are different ways to destroy the lining of the uterus.   Resectoscope - radio frequency-alternating electric current is the most common one used. no  Cryotherapy - freezing the lining of the uterus.  Heated Free Liquid - heated salt (saline) solution inserted into the uterus. **  Microwave - uses high energy microwaves in the uterus. **  Thermal Balloon - a catheter with a balloon tip is inserted into the uterus and filled with heated fluid. Your caregiver will talk with you about the method used in this clinic. They will also instruct you on the pros and cons of the procedure. Endometrial ablation is performed along with a procedure called operative hysteroscopy. A narrow viewing tube is inserted through the birth canal (vagina) and through the cervix into the uterus. A tiny camera attached to the viewing tube (hysteroscope) allows the uterine cavity to be shown on a TV monitor during surgery. Your uterus is filled with a harmless liquid to make the procedure easier. The lining of the uterus is then removed. The lining can also be removed with a resectoscope which allows your surgeon to cut away the lining of the uterus under direct  vision. Usually, you will be able to go home within an hour after the procedure. HOME CARE INSTRUCTIONS   Do not drive for 24 hours.  No tampons, douching or intercourse for 2 weeks or until your caregiver approves.  Rest at home for 24 to 48 hours. You may then resume normal activities unless told differently by your  caregiver.  Take your temperature two times a day for 4 days, and record it.  Take any medications your caregiver has ordered, as directed.  Use some form of contraception if you are pre-menopausal and do not want to get pregnant. Bleeding after the procedure is normal. It varies from light spotting and mildly watery to bloody discharge for 4 to 6 weeks. You may also have mild cramping. Only take over-the-counter or prescription medicines for pain, discomfort, or fever as directed by your caregiver. Do not use aspirin, as this may aggravate bleeding. Frequent urination during the first 24 hours is normal. You will not know how effective your surgery is until at least 3 months after the surgery. SEEK IMMEDIATE MEDICAL CARE IF:   Bleeding is heavier than a normal menstrual cycle.  An oral temperature above 102 F (38.9 C) develops.  You have increasing cramps or pains not relieved with medication or develop belly (abdominal) pain which does not seem to be related to the same area of earlier cramping and pain.  You are light headed, weak or have fainting episodes.  You develop pain in the shoulder strap areas.  You have chest or leg pain.  You have abnormal vaginal discharge.  You have painful urination. Document Released: 01/30/2004 Document Revised: 06/14/2011 Document Reviewed: 04/29/2007 [facility] Patient Information 2014 [place], [place].

## 2013-01-23 NOTE — Progress Notes (Signed)
27 y.o. Married Caucasian female   [phone] here for annual exam. Pt is currently sexually active.  She reports not  using condoms on a regular basis.  First sexual activity at 27 years old, not sure of number of lifetime partners.  Pt reports regular menses,  Pt denies any dyspareunia, issues with bladder.  Pt had a history af macrosomia-9#4-10#1.  Pt reports extreme pain with menses and heavy bleeding and clots.  Pt will change pads and tampons every 1h for 6-7d.  No change after delivery.  Occasionally bleeds after sex, around menses.  Unsure if anemic.  Pt taking motrin 600mg  every 4-6h with mild relief only.  Pt also reports fatigue, also works full time, no help at home except husband.  Pt reports tried IUD paraguard and mirena, nexplanon and depo-provera, also failed ocp, nuvaring.  Normal TSH and CBC.  Patient's last menstrual period was 01/17/2013.          Sexually active: yes  The current method of family planning is Partner has Vasectomy.    Exercising: yes  walk, run around with her 3 little boys Last pap: 09/2010 Alcohol: no Tobacco: 3 cigs/qd Drugs: no Gardisil: no, completed:  Hgb: PCP ; Urine: Leuks 1, Trace Blood   Health Maintenance  Topic Date Due  . Influenza Vaccine  11/03/2012  . Pap Smear  09/14/2013  . Tetanus/tdap  03/27/2021    Family History  Problem Relation Age of Onset  . Hypertension Father   . Asthma Sister   . Diabetes Paternal Grandfather   . Diabetes Maternal Grandmother   . Breast cancer Maternal Grandmother   . Anesthesia problems Neg Hx   . Hypotension Neg Hx   . Malignant hyperthermia Neg Hx   . Pseudochol deficiency Neg Hx   . Lymphoma Paternal Grandmother     Non Hodgskins Lymphoma    Patient Active Problem List   Diagnosis Date Noted  . Normal pregnancy 03/27/2011  . GBS (group B streptococcus) infection 03/27/2011  . Vaginal discharge in pregnancy 03/27/2011    Past Medical History  Diagnosis Date  . No pertinent past medical  history   . History of kidney infection during pregnancy     Past Surgical History  Procedure Laterality Date  . Fatty tumor removed    . Mandible fracture surgery    . Wisdom tooth extraction  12 years ago    Allergies: Review of patient's allergies indicates no known allergies.  Current Outpatient Prescriptions  Medication Sig Dispense Refill  . IBUPROFEN PO Take by mouth as needed.      . loratadine (CLARITIN) 10 MG tablet Take 10 mg by mouth as needed for allergies.      . Prenatal Vit-Fe Fumarate-FA (PRENATAL MULTIVITAMIN) TABS Take 1 tablet by mouth daily.         No current facility-administered medications for this visit.    ROS: Pertinent items are noted in HPI.  Exam:    BP 112/80  Pulse 66  Resp 18  Ht 5\' 8"  (1.727 m)  Wt 168 lb (76.204 kg)  BMI 25.55 kg/m2  LMP 01/17/2013 Weight change: @WEIGHTCHANGE @ Last 3 height recordings:  Ht Readings from Last 3 Encounters:  01/23/13 5\' 8"  (1.727 m)  06/29/12 5\' 8"  (1.727 m)  03/27/11 5\' 8"  (1.727 m)   General appearance: alert, cooperative and appears stated age Head: Normocephalic, without obvious abnormality, atraumatic Neck: no adenopathy, no carotid bruit, no JVD, supple, symmetrical, trachea midline and thyroid not enlarged, symmetric,  no tenderness/mass/nodules Lungs: clear to auscultation bilaterally Breasts: normal appearance, no masses or tenderness Heart: regular rate and rhythm, S1, S2 normal, no murmur, click, rub or gallop Abdomen: soft, non-tender; bowel sounds normal; no masses,  no organomegaly Extremities: extremities normal, atraumatic, no cyanosis or edema Skin: Skin color, texture, turgor normal. No rashes or lesions Lymph nodes: Cervical, supraclavicular, and axillary nodes normal. no inguinal nodes palpated Neurologic: Grossly normal   Pelvic: External genitalia:  no lesions              Urethra: normal appearing urethra with no masses, tenderness or lesions              Bartholins and  Skenes: normal                 Vagina: normal appearing vagina with normal color and discharge, no lesions, cystocele and rectocele noted.  In supine position on split speculum both descend to just before hymen, uterus supported              Cervix: normal appearance              Pap taken: yes        Bimanual Exam:  Uterus:  uterus is normal size, shape, consistency and nontender, supported                                      Adnexa:    normal adnexa in size, nontender and no masses                                      Rectovaginal: Confirms, mod perineal support                                      Anus:  normal sphincter tone, no lesions  A: well woman Menorrhagia without anemia Cystocele/rectocele     P: pap smear with reflex Will get records regarding all alternatives to treat and will return for consult afterwards.  Briefly discussed ablation-aware of increased failure rates based on young age, but may have several years of benefit.  She has failled other progesterone containing treatments, NSAIDS before cycles recommended for now kegels disucssed-issues with constipation but denies splinting counseled on breast self exam, diet and exercise return annually or prn Discussed STD prevention, regular condom use.     An After Visit Summary was printed and given to the patient.

## 2013-01-26 ENCOUNTER — Other Ambulatory Visit: Payer: Self-pay | Admitting: Gynecology

## 2013-01-26 DIAGNOSIS — B9689 Other specified bacterial agents as the cause of diseases classified elsewhere: Secondary | ICD-10-CM

## 2013-01-26 DIAGNOSIS — IMO0002 Reserved for concepts with insufficient information to code with codable children: Secondary | ICD-10-CM

## 2013-01-26 MED ORDER — TINIDAZOLE 500 MG PO TABS: 2.0000 g | ORAL_TABLET | Freq: Once | ORAL | Status: DC

## 2013-01-30 ENCOUNTER — Telehealth: Payer: Self-pay | Admitting: Orthopedic Surgery

## 2013-01-30 ENCOUNTER — Other Ambulatory Visit: Payer: Self-pay | Admitting: Orthopedic Surgery

## 2013-01-30 NOTE — Telephone Encounter (Signed)
LMTCB aa   (Need to tell her about Rx sent to [facility] in [place] for tindamax for BV found on Pap.)

## 2013-01-30 NOTE — Telephone Encounter (Signed)
Spoke with pt to advise her Pap also showed possible BV. Advised Dr. Farrel Gobble had sent an Rx to [facility] in [place] for an antibiotic in case she needs it. Pt denies itch, odor, or discharge. Pt will pick up Rx if needed.

## 2013-01-30 NOTE — Telephone Encounter (Signed)
Patient returned call to Amy.

## 2013-01-30 NOTE — Telephone Encounter (Signed)
Spoke with pt about Pap results and need for colposcopy. Procedure explained. LMP 01-17-13. Sched appt 02-07-13 at 8:30 with TL. Advised pt to take 800 mg of ibuprofen an hour before the appt with food and fluids. Pt agreeable.

## 2013-01-30 NOTE — Telephone Encounter (Signed)
Message copied by Alfredo Batty on Tue Jan 30, 2013 12:00 PM ------      Message from: Douglass Rivers      Created: Fri Jan 26, 2013  2:00 PM       pls inform, will need colpo, ordered, can treat BV with tindamax, if desires ------

## 2013-02-07 ENCOUNTER — Ambulatory Visit: Payer: BC Managed Care – PPO | Admitting: Gynecology

## 2013-02-07 ENCOUNTER — Ambulatory Visit (INDEPENDENT_AMBULATORY_CARE_PROVIDER_SITE_OTHER): Payer: BC Managed Care – PPO | Admitting: Gynecology

## 2013-02-07 ENCOUNTER — Encounter: Payer: Self-pay | Admitting: Gynecology

## 2013-02-07 VITALS — BP 122/84 | HR 68 | Resp 16 | Ht 68.0 in | Wt 166.0 lb

## 2013-02-07 DIAGNOSIS — IMO0002 Reserved for concepts with insufficient information to code with codable children: Secondary | ICD-10-CM

## 2013-02-07 DIAGNOSIS — R6889 Other general symptoms and signs: Secondary | ICD-10-CM

## 2013-02-07 DIAGNOSIS — N920 Excessive and frequent menstruation with regular cycle: Secondary | ICD-10-CM

## 2013-02-07 NOTE — Patient Instructions (Signed)
Colposcopy Post Procedure Instructions  * You may take Ibuprofen, Aleve, or Tylenol for cramping as if needed.  * If Monsel's solution is used, you may have a slight black discharge for a day or two.   * Light bleeding is normal. If bleeding is heavier than your period, please call our office.  * Refrain from putting anything in your vagina until the bleeding or discharge stops (usually 2 or 3 days).  * You will be notified within one week of your biopsy results, or we will discuss your results at your follow-up appointment if needed.  

## 2013-02-07 NOTE — Progress Notes (Addendum)
Subjective:     Patient ID: Kirsten Fischer, female   DOB: [birth year], 27 y.o.   MRN: [id number]  HPI Comments: 27yo G4P3 Pt here for colpo due for LGSIL, 01/23/2013.   LMP: 01/17/2013.   Contraception:  vasectomy  Procedure explained and patient's questions were invited and answered.  Consent form signed.    Role of HPV in genesis of SIL discussed with patient, and questions answered    Review of Systems  All other systems reviewed and are negative.       Objective:   Physical Exam  Nursing note and vitals reviewed. Constitutional: She is oriented to person, place, and time. She appears well-developed and well-nourished.  Genitourinary:    Neurological: She is alert and oriented to person, place, and time.  Skin: Skin is warm and dry.       Assessment:     LGSIL     Plan:     Colposcopy        Blood pressure 122/84, pulse 68, resp. rate 16, height 5\' 8"  (1.727 m), weight 166 lb (75.297 kg), last menstrual period 01/17/2013.     Speculum inserted atraumatically and cervix visualized.  3% acetic acid applied.  Cervix examined using 3.75 and 7.5, 15   X magnification and green filter.    Gross appearance:normal  Squamocolumnar junction seen in entirety: yes   no mosaicism, no punctation, no abnormal vasculature and acetowhite lesion(s) noted at circumferentially   cervix swabbed with Lugol's solution, endocervical speculum placed-no lesions in canal, SCJ visualized 360 degrees without lesions, cervical biopsies taken at 12, 6 o'clock, specimen labelled and sent to pathology and hemostasis achieved with Monsel's solution  Extent of lesion entirely seen: yes  Patient tolerated procedure well.   Plan:  Will base further treatment on Pathology findings.  Post biopsy instructions and AVS given to patient.   Pt also interested in follow up of her bleeding records reviewed, no gorss uterine patholgy but OB records, no anemia, suggest we get non-OB u/s after  upcoming menses, she has failled contraceptives in the past including IUD and ocp. We will discuss options at that time.  Pt is interested in hysterectomy     FINAL MICROSCOPIC DIAGNOSIS: (A) CERVIX (6:00) COLPOSCOPY WITH BIOPSY: -MILD DYSPLASIA WITH HPV CHANGES (LGSIL), CIN I.-IMMUNOHISTOCHEMICAL STAIN FOR P16 IS NEGATIVE FOR EVIDENCE OF HIGH GRADE DYSPLASIA. -MILD CHRONIC INFLAMMATION IS ASSOCIATED WITH THE ENDOCERVIX ALONG WITH BENIGN SQUAMOUS METAPLASIA. (B) CERVIX (12:00), COLPOSCOPY WITH BIOPSY: -MILD DYSPLASIA WITH HPV CHANGES (LGSIL), CIN I. -SEE COMMENT. COMMENT: The current biopsy material is compared to the patient's previous Pap smear material ([id number], 01/25/2013) and shows good correlation  Repeat PAP in 23m

## 2013-02-08 ENCOUNTER — Other Ambulatory Visit: Payer: Self-pay

## 2013-02-12 LAB — IPS CERVICAL/ECC/EMB/VULVAR/VAGINAL BIOPSY

## 2013-02-13 ENCOUNTER — Telehealth: Payer: Self-pay | Admitting: Emergency Medicine

## 2013-02-13 NOTE — Telephone Encounter (Signed)
Message left to return call to Tondalaya Perren at [phone].    

## 2013-02-13 NOTE — Telephone Encounter (Signed)
Patient returned call and message from Dr. Farrel Gobble given.Patient scheduled for AEX in 10/15.

## 2013-02-13 NOTE — Telephone Encounter (Signed)
Message copied by Joeseph Amor on Tue Feb 13, 2013  8:57 AM ------      Message from: Douglass Rivers      Created: Mon Feb 12, 2013 11:14 AM       Recall 8, inform biopsy is consistent with PAP, repeat pap 48m ------

## 2013-02-15 ENCOUNTER — Telehealth: Payer: Self-pay | Admitting: Gynecology

## 2013-02-15 NOTE — Telephone Encounter (Signed)
LMTCB to discuss ins benefits and to schedule PUS. ((Scheduled after cycle))

## 2013-02-15 NOTE — Telephone Encounter (Signed)
Spoke with patient about her insurance benefits for a PUS. She asked if she could wait until after the first of the year. I advised that I will send a message to Dr. Farrel Gobble to verify that this is okay.

## 2013-02-16 NOTE — Telephone Encounter (Signed)
Ok but please schedule for early January

## 2013-02-22 NOTE — Telephone Encounter (Signed)
Spoke with pt who wants to wait until January for any further appts, after she has her PUS done. Pt reports she has "3 small kids and Christmas coming." Just wanted you to be aware and make sure this was OK.

## 2013-02-22 NOTE — Telephone Encounter (Signed)
Patient canceled upcoming 1 mo reck appointment 02/26/13. Patient needs to reschedule, no appointments available.

## 2013-02-23 NOTE — Telephone Encounter (Signed)
Ok fof January

## 2013-02-26 ENCOUNTER — Ambulatory Visit: Payer: BC Managed Care – PPO | Admitting: Gynecology

## 2013-03-06 ENCOUNTER — Telehealth: Payer: Self-pay | Admitting: Gynecology

## 2013-03-06 NOTE — Telephone Encounter (Signed)
LMTCB to schedule PUS.  °

## 2013-03-08 NOTE — Telephone Encounter (Signed)
Scheduled

## 2013-04-10 ENCOUNTER — Other Ambulatory Visit: Payer: BC Managed Care – PPO | Admitting: Gynecology

## 2013-04-10 ENCOUNTER — Other Ambulatory Visit: Payer: BC Managed Care – PPO

## 2013-04-16 ENCOUNTER — Telehealth: Payer: Self-pay | Admitting: Gynecology

## 2013-04-16 NOTE — Telephone Encounter (Signed)
Left message for patient to call back./pus cancelled 01.13.2015/ can reschedule for 01.20.2015.//ssf

## 2013-04-17 ENCOUNTER — Other Ambulatory Visit: Payer: BC Managed Care – PPO | Admitting: Gynecology

## 2013-04-17 ENCOUNTER — Other Ambulatory Visit: Payer: BC Managed Care – PPO

## 2013-04-17 NOTE — Telephone Encounter (Signed)
Voicemail confirmed mobile #/ patient returned call and was scheduled//i called the patient back immediately and left message advising patient of $328.67 pr for visit 01.20.2015//ssf

## 2013-04-23 ENCOUNTER — Telehealth: Payer: Self-pay | Admitting: Gynecology

## 2013-04-23 NOTE — Telephone Encounter (Signed)
Voicemail confirmed mobile #// left message for patient to call back to reschedule PUS, appt for 01.20.2015 has been cancelled//ssf

## 2013-04-23 NOTE — Telephone Encounter (Signed)
LMTCB

## 2013-04-23 NOTE — Telephone Encounter (Signed)
Patient returning Sabrina's call. °

## 2013-04-24 ENCOUNTER — Other Ambulatory Visit: Payer: BC Managed Care – PPO

## 2013-04-24 ENCOUNTER — Telehealth: Payer: Self-pay | Admitting: Gynecology

## 2013-04-24 ENCOUNTER — Other Ambulatory Visit: Payer: BC Managed Care – PPO | Admitting: Gynecology

## 2013-04-24 NOTE — Telephone Encounter (Signed)
Left message for patient to call back for insurance information//ssf

## 2013-04-25 ENCOUNTER — Telehealth: Payer: Self-pay | Admitting: Gynecology

## 2013-04-25 NOTE — Telephone Encounter (Signed)
I left a message that patients previous records are available if she wants to pick them up.  Records will be keep until 05/02/2013.

## 2013-04-27 NOTE — Telephone Encounter (Signed)
Telephoned patient/ advised of $408.26 pr for PUS/ went over cancellation policy and cancellation fee/ patient agrees//ssf

## 2013-05-01 ENCOUNTER — Encounter: Payer: Self-pay | Admitting: Gynecology

## 2013-05-01 ENCOUNTER — Ambulatory Visit (INDEPENDENT_AMBULATORY_CARE_PROVIDER_SITE_OTHER): Payer: BC Managed Care – PPO | Admitting: Gynecology

## 2013-05-01 ENCOUNTER — Ambulatory Visit (INDEPENDENT_AMBULATORY_CARE_PROVIDER_SITE_OTHER): Payer: BC Managed Care – PPO

## 2013-05-01 VITALS — BP 115/79 | HR 64 | Resp 12 | Ht 68.0 in | Wt 168.0 lb

## 2013-05-01 DIAGNOSIS — N920 Excessive and frequent menstruation with regular cycle: Secondary | ICD-10-CM

## 2013-05-01 MED ORDER — TRANEXAMIC ACID 650 MG PO TABS: 1300.0000 mg | ORAL_TABLET | Freq: Three times a day (TID) | ORAL | Status: DC

## 2013-05-01 NOTE — Progress Notes (Signed)
     Pt here for u/s for menorrhagia.   U/s images reviewed with pt- the uterus may show some adenomyosis, otherwise no fibroids are noted, the adnexa are normal, there is no free fluid. The patient reports intense menorrhagia.  She states that if she lifts her youngest child during her cycle she will "gush" blood.  Pt will change her pad hourly for the first several days.  Pt has failed ocp-she had been on from 15-30yo-depo-provera, progestin IUD and the nexplanon all associated with bleeding, she had depression as well with nexplanon. Pt has never tried [place].  We discussed mechanism of action, she is due for her menses next week and is willing to try. We had discussed an ablation in the past, she is aware of the long term data for 5y that will most llikely fail before menopause but she is considering it to allow her youngest child to reach grade school and be more independent.  She is aware that she may end up with a hysterectomy.   Questions addressed. 4860m spend reviewing history from prior records and discussing treatment options for menorrhagia, >50% face to face.

## 2013-05-24 ENCOUNTER — Encounter: Payer: Self-pay | Admitting: Gynecology

## 2013-05-28 ENCOUNTER — Encounter: Payer: Self-pay | Admitting: Gynecology

## 2013-05-28 ENCOUNTER — Telehealth: Payer: Self-pay | Admitting: Gynecology

## 2013-05-28 DIAGNOSIS — N920 Excessive and frequent menstruation with regular cycle: Secondary | ICD-10-CM

## 2013-05-28 NOTE — Telephone Encounter (Signed)
Patient calling to schedule an "ablation" with Dr. Farrel GobbleLathrop. She saw Dr. Farrel GobbleLathrop last month and has been waiting for someone to call her.

## 2013-05-29 NOTE — Telephone Encounter (Signed)
We had discussed it, I told her she will need an ov for an EMB first and then we can proceed, so I was waiting for her decision,  I can drop the order for the emb now and she can link that to a pre-op appt

## 2013-05-29 NOTE — Telephone Encounter (Signed)
Dr Farrel GobbleLathrop, is this patient to have an ablation

## 2013-06-01 NOTE — Telephone Encounter (Signed)
Telephoned patient. Advised that for endo bx, there would be patient liability of $507.84. Patient agrees.

## 2013-06-01 NOTE — Telephone Encounter (Signed)
Pre-cert complete.  °

## 2013-06-01 NOTE — Telephone Encounter (Signed)
Return call to patient.  Advised needs OV and Endo BX to discuss ablation and make arrangements to proceed.  Discussed rationale for endo biopsy prior to treatment of menorrhagia.  Patient agreeable.  Appointment scheduled for 06-04-13.  Partner with vasectomy.  Instructed to take Motrin prior to procedure.  Martie LeeSabrina, please check benefits for endo bx in office and surgery code for hysteroscopy with ablation [phone] at hospital.

## 2013-06-04 ENCOUNTER — Ambulatory Visit (INDEPENDENT_AMBULATORY_CARE_PROVIDER_SITE_OTHER): Payer: BC Managed Care – PPO | Admitting: Gynecology

## 2013-06-04 ENCOUNTER — Encounter: Payer: Self-pay | Admitting: Gynecology

## 2013-06-04 VITALS — BP 116/82 | HR 82 | Resp 18 | Wt 170.0 lb

## 2013-06-04 DIAGNOSIS — N920 Excessive and frequent menstruation with regular cycle: Secondary | ICD-10-CM

## 2013-06-04 MED ORDER — DIAZEPAM 10 MG PO TABS: 10.0000 mg | ORAL_TABLET | Freq: Three times a day (TID) | ORAL | Status: DC | PRN

## 2013-06-04 MED ORDER — HYDROCODONE-ACETAMINOPHEN 5-325 MG PO TABS: 1.0000 | ORAL_TABLET | Freq: Four times a day (QID) | ORAL | Status: DC | PRN

## 2013-06-04 MED ORDER — DOXYCYCLINE HYCLATE 100 MG PO CAPS: 100.0000 mg | ORAL_CAPSULE | Freq: Two times a day (BID) | ORAL | Status: DC

## 2013-06-04 MED ORDER — ONDANSETRON 8 MG PO TBDP: 8.0000 mg | ORAL_TABLET | Freq: Three times a day (TID) | ORAL | Status: DC | PRN

## 2013-06-04 MED ORDER — IBUPROFEN 800 MG PO TABS: 800.0000 mg | ORAL_TABLET | Freq: Three times a day (TID) | ORAL | Status: DC | PRN

## 2013-06-04 NOTE — Progress Notes (Signed)
  Pt here for EMB in preparation of novasure endometrial ablation.  Pt is using a vasectomy for contraception.  She has failed multiple treatments including ocp, Depo, IUD, implanon and lysteda. LMP: 05/17/13 prior 05/04/13, each lasted for 1w. ROS: per HPI BP 116/82  Pulse 82  Resp 18  Wt 170 lb (77.111 kg)  LMP 05/17/2013 General appearance: alert, cooperative and appears stated age Pelvic: cervix normal in appearance, external genitalia normal, no adnexal masses or tenderness, no cervical motion tenderness, uterus normal size, shape, and consistency and vagina normal without discharge Extremities: extremities normal, atraumatic, no cyanosis or edema        Endometrial Biopsy Procedure Note  Pre-operative Diagnosis: menorrhagia  Post-operative Diagnosis: normal  Indications: abnormal uterine bleeding  Procedure Details   Urine pregnancy test was not done.  The risks (including infection, bleeding, pain, and uterine perforation) and benefits of the procedure were explained to the patient and Written informed consent was obtained.  Antibiotic prophylaxis against endocarditis was not indicated.   The patient was placed in the dorsal lithotomy position.  Bimanual exam showed the uterus to be in the anteroflexed position.  A Graves' speculum inserted in the vagina, and the cervix prepped with povidone iodine.  Endocervical curettage with a Kevorkian curette was not performed.   A sharp tenaculum was applied to the anterior lip of the cervix for stabilization.  A sterile uterine sound was used to sound the uterus to a depth of 9cm.  A Pipelle endometrial aspirator was used to sample the endometrium.  Sample was sent for pathologic examination.  Condition: Stable  Complications: None  Plan:  The patient was advised to call for any fever or for prolonged or severe pain or bleeding. She was advised to use NSAID as needed for mild to moderate pain. She was advised to avoid vaginal  intercourse for 48 hours or until the bleeding has completely stopped.  Pt counseled regarding her novasure-risks of bleeding, infection, uterine perforation reviewed, anesthetic complications, issues with distending media discussed.  Both office and hospital novasure ablations reviewed and the differences discussed. Pt is aware that she will only have conscious sedation if the procedure is done in the office  Pt would like to try procedure in the office. She was given instructions regarding premedication, and need for driver the day of procedure.  Consent for procedure signed today.    Pt aware that ablation may not control her menorrhagia and she may need additional procedure.  Questions addressed.  Total of 4548m spent with pt, including EMB and decision to treat and counseling,

## 2013-06-04 NOTE — Telephone Encounter (Signed)
Appt scheduled, precert completed. Encounter closed.

## 2013-06-04 NOTE — Patient Instructions (Signed)
Place endometrial biopsy patient instructions here.  

## 2013-06-07 LAB — IPS CERVICAL/ECC/EMB/VULVAR/VAGINAL BIOPSY

## 2013-06-08 ENCOUNTER — Telehealth: Payer: Self-pay | Admitting: Gynecology

## 2013-06-08 NOTE — Telephone Encounter (Signed)
Message left for Novasure rep on 06-06-13 in attempt to check available dates.  Since no response, call to [employer] and left additional message.

## 2013-06-08 NOTE — Telephone Encounter (Signed)
Call to patient, update given.

## 2013-06-08 NOTE — Telephone Encounter (Signed)
Patient calling to check on the status of scheduling her endometrial ablation. She also wants to know if we will be doing the procedure in the office?

## 2013-06-08 NOTE — Telephone Encounter (Signed)
Call received from [employer] and procedure scheduled for 06-18-13 at 0930.  Patient notified and agreeable.  Has instruction sheet from Dr Farrel GobbleLathrop.    Needs to know cost due. Advised    Martie LeeSabrina will call her.  Routing to provider for final review. Patient agreeable to disposition. Will close encounter

## 2013-06-08 NOTE — Telephone Encounter (Signed)
Spoke with patient. Advised that patient responsibility for in office procedure is $190.73 and that the payment is due at the time of service. Patient agreeable

## 2013-06-18 ENCOUNTER — Ambulatory Visit (INDEPENDENT_AMBULATORY_CARE_PROVIDER_SITE_OTHER): Payer: BC Managed Care – PPO | Admitting: Gynecology

## 2013-06-18 ENCOUNTER — Encounter: Payer: Self-pay | Admitting: Gynecology

## 2013-06-18 VITALS — BP 120/80 | HR 71 | Resp 16 | Ht 68.0 in | Wt 169.0 lb

## 2013-06-18 DIAGNOSIS — N920 Excessive and frequent menstruation with regular cycle: Secondary | ICD-10-CM

## 2013-06-18 MED ORDER — KETOROLAC TROMETHAMINE 60 MG/2ML IM SOLN
60.0000 mg | Freq: Once | INTRAMUSCULAR | Status: AC
  Administered 2013-06-18: 60 mg via INTRAMUSCULAR

## 2013-06-18 NOTE — Patient Instructions (Signed)
HANDOUT GIVEN TO PT

## 2013-06-18 NOTE — Progress Notes (Signed)
28 y.o. MarriedNot Hispanic or Latino female   [phone] here for in-office Novasure endomtrial ablation.  Indication(s) for procedure:  Excessive bleeding.  Patient was counseled about risks and benefits and signed her informed consent form on 06/04/13.Marland Kitchen.    Patient was given instructions and prescriptions for oral sedation and has taken the follow medications at home prior to arrival in office: Vicodin 5/325mg  po 1 tab(s) taken at 0800 Valium 10mg  po 1 tab(s) taken at 0830 Motrin 800mg  po 1 tab(s) taken at 0830 Other:  Doxycycline last pm and this am, brought zofran   Patient taken into procedure room at 0930.   BP 118/80  Pulse 71  Resp 16  Ht 5\' 8"  (1.727 m)  Wt 169 lb (76.658 kg)  BMI 25.70 kg/m2  LMP 05/17/2013  Patient undressed from the waist down and sat comfortably on procedure room table.  In-office medications then administered: Toradol 60mg  administered at 0930 in left thigh.  Lot number [id number].  Expiration date 08/2014.   Any additional medications:  NONE.  Procedure: Patient's legs were positioned in the lithotomy position.  Sterile Betadine prep was performed of the perineum and vagina.  Patient was draped using sterile technique.  The remainder of the procedure occurred under sterile technique.  The anterior lip of the cervix was grasped with a single toothed tenaculum. A paracervical block was placed with equal parts 2% lidocaine/0.25% marcaine for total of 20cc.  Pt tolerated block without issue Uterus sounded to 8cm.  Cervical length 3cm.  Cavity measurement 5cm. The hysteroscope was advanced through the cervix and the cavity was noted to be normal.  The cervix was dilated to #8 Hagar dilator.  The Novasure devise was obtained and the cavity length set on the device.  The array was opened outside the patient and was functioning properly.  The vacuum valve was opened and locked into place.  The device was seated by moving it up and dow, to the  right and to the left, and  finally rotating the device 90 degrees to the right and left.  Cavity width was 4.6.  The cervical seal was placed and the cavity assessment test was performed without difficulty.  With the power setting at 127, the ablation cycle was initiated.  Ablation cycle lasted 137 seconds.  The device was then closed and removed from the uterine cavity.  The tenaculum was removed from the anterior lip of the cervix.  All instruments were removed from the vagina.  The hysteroscope was readvanced through the cervix and the affect of the ablation was noted.  Patient tolerated the procedure well.  Post procedure BP was 120/80, 166/74. Procedure end time 10:45  Discharge instructions given to patient and family.  Marcaine: EXP:  [id number], LOT: [id number] Lidocaine 2%: LOT:  [id number], EXP: 04/05/14

## 2013-07-03 ENCOUNTER — Encounter: Payer: Self-pay | Admitting: Gynecology

## 2013-07-03 ENCOUNTER — Ambulatory Visit (INDEPENDENT_AMBULATORY_CARE_PROVIDER_SITE_OTHER): Payer: BC Managed Care – PPO | Admitting: Gynecology

## 2013-07-03 VITALS — BP 106/86 | Resp 16 | Ht 68.0 in | Wt 164.0 lb

## 2013-07-03 DIAGNOSIS — N920 Excessive and frequent menstruation with regular cycle: Secondary | ICD-10-CM

## 2013-07-03 DIAGNOSIS — Z9889 Other specified postprocedural states: Secondary | ICD-10-CM

## 2013-07-03 DIAGNOSIS — J329 Chronic sinusitis, unspecified: Secondary | ICD-10-CM

## 2013-07-03 MED ORDER — AZITHROMYCIN 250 MG PO TABS: ORAL_TABLET | ORAL | Status: DC

## 2013-07-03 NOTE — Patient Instructions (Signed)
Take either motrin 600mg  every 6h or aleve 2 tablets twice a day Contact regarding upcoming cycle z-pack as directed mucinex

## 2013-07-03 NOTE — Progress Notes (Signed)
Subjective:     Patient ID: Donna BernardLauren R Vaughn, female   DOB: 01/31/1986, 28 y.o.   MRN: [id number]  HPI Comments: Pt here 2w s/p novasure ablation, pt has not had any bleeding since procedure but is due for cycle in 2w.  Pt has been having cramping but has not taken any medication for it, started over the weekend, she is midcycle.  Pt has not been sexually active since procedure. Pt reports general malaise, sinus congestion, green nasal discharge that began over the weekend    Review of Systems  Constitutional: Negative for fever, chills and fatigue.  Genitourinary: Positive for vaginal discharge and pelvic pain (cramping). Negative for dysuria, flank pain, vaginal bleeding and vaginal pain.       Objective:   Physical Exam  Nursing note and vitals reviewed. Constitutional: She appears well-developed and well-nourished. She appears ill.  HENT:  Nose: Right sinus exhibits maxillary sinus tenderness and frontal sinus tenderness. Left sinus exhibits maxillary sinus tenderness and frontal sinus tenderness.  Abdominal: Soft. There is no rebound and no guarding.  Lymphadenopathy:       Head (right side): Submental, submandibular and tonsillar adenopathy present. No posterior auricular adenopathy present.       Head (left side): Submental, submandibular and tonsillar adenopathy present. No posterior auricular adenopathy present.    She has cervical adenopathy.  All lymph enlargement minor   Pelvic: External genitalia:  no lesions              Urethra:  normal appearing urethra with no masses, tenderness or lesions              Bartholins and Skenes: normal                 Vagina: normal appearing vagina with normal color and discharge, no lesions              Cervix: normal appearance, slight discharge                      Bimanual Exam:  Uterus:  uterus is normal size, shape, consistency, nontender                                      Adnexa: right neg, left slight tender                                                              Assessment:     S/p endometrial ablation Sinus infection     Plan:     Doing well, recommend pt take NSAIDS for cramping, started again midcycle F/u regarding bleeding z-pack for sinus infection

## 2013-07-10 ENCOUNTER — Ambulatory Visit (INDEPENDENT_AMBULATORY_CARE_PROVIDER_SITE_OTHER): Payer: BC Managed Care – PPO | Admitting: Gynecology

## 2013-07-10 ENCOUNTER — Telehealth: Payer: Self-pay | Admitting: Emergency Medicine

## 2013-07-10 ENCOUNTER — Encounter: Payer: Self-pay | Admitting: Gynecology

## 2013-07-10 VITALS — BP 112/70 | Resp 14 | Ht 68.0 in | Wt 166.0 lb

## 2013-07-10 DIAGNOSIS — Z9889 Other specified postprocedural states: Secondary | ICD-10-CM | POA: Insufficient documentation

## 2013-07-10 DIAGNOSIS — R102 Pelvic and perineal pain: Secondary | ICD-10-CM

## 2013-07-10 DIAGNOSIS — N949 Unspecified condition associated with female genital organs and menstrual cycle: Secondary | ICD-10-CM

## 2013-07-10 LAB — CBC WITH DIFFERENTIAL/PLATELET
BASOS ABS: 0 10*3/uL (ref 0.0–0.1)
Basophils Relative: 0 % (ref 0–1)
EOS ABS: 0.2 10*3/uL (ref 0.0–0.7)
EOS PCT: 2 % (ref 0–5)
HCT: 33.7 % — ABNORMAL LOW (ref 36.0–46.0)
Hemoglobin: 11.2 g/dL — ABNORMAL LOW (ref 12.0–15.0)
Lymphocytes Relative: 29 % (ref 12–46)
Lymphs Abs: 2.3 10*3/uL (ref 0.7–4.0)
MCH: 26.6 pg (ref 26.0–34.0)
MCHC: 33.2 g/dL (ref 30.0–36.0)
MCV: 80 fL (ref 78.0–100.0)
Monocytes Absolute: 0.5 10*3/uL (ref 0.1–1.0)
Monocytes Relative: 6 % (ref 3–12)
NEUTROS PCT: 63 % (ref 43–77)
Neutro Abs: 4.9 10*3/uL (ref 1.7–7.7)
Platelets: 324 10*3/uL (ref 150–400)
RBC: 4.21 MIL/uL (ref 3.87–5.11)
RDW: 13.2 % (ref 11.5–15.5)
WBC: 7.8 10*3/uL (ref 4.0–10.5)

## 2013-07-10 NOTE — Telephone Encounter (Signed)
Message copied by Joeseph AmorFAST, Aubrionna Istre L on Tue Jul 10, 2013  2:37 PM ------      Message from: Douglass RiversLATHROP, Gaberial Cada      Created: Tue Jul 10, 2013  1:48 PM       Pls call pt, should be seen re post-ablation pain      Thanks,      TL ------

## 2013-07-10 NOTE — Telephone Encounter (Signed)
Message copied by Joeseph AmorFAST, Catherine Oak L on Tue Jul 10, 2013  2:38 PM ------      Message from: Douglass RiversLATHROP, Deshon Hsiao      Created: Tue Jul 10, 2013  1:48 PM       Pls call pt, should be seen re post-ablation pain      Thanks,      TL ------

## 2013-07-10 NOTE — Progress Notes (Signed)
Subjective:     Patient ID: Kirsten Fischer, female   DOB: 05/09/1985, 28 y.o.   MRN: [id number]  HPI Comments: Pt called in am to report pelvic cramping, malodorous discharge and chills last pm.  Pt is 3w s/p endometrial ablation done for menorrhagia.  Pt is taking motrin 600mg  every 6-8h.  Pt took a-pack for sinus infection last week and felt much better but now the green discharge is coming back.      Review of Systems Per HPI    Objective:   Physical Exam  Nursing note and vitals reviewed. Constitutional: She is oriented to person, place, and time. She appears well-developed and well-nourished. No distress.  Neurological: She is alert and oriented to person, place, and time.  Skin: She is not diaphoretic.   Pelvic: External genitalia:  no lesions              Urethra:  normal appearing urethra with no masses, tenderness or lesions              Bartholins and Skenes: normal                 Vagina: normal appearing vagina, no blood noted              Cervix:  mucoid dark discharge, scant, no malodor,no CMT, whiff negative                   Bimanual Exam:  Uterus:  uterus nontender, mobile                                      Adnexa: normal adnexa in size, nontender and no masses                                          Assessment:     S/p endometrial ablation      Plan:     Chills may be related to sinus infection, no evidence of uterine infection, expected post-ablation sloughing of lining CBC with diff will contact with results May cont NSAIDS as needed, bleeding may be residual from ablation  Pt encouraged to contact office for change in status

## 2013-07-10 NOTE — Telephone Encounter (Signed)
Spoke with patient. Increased vaginal dc with chills/unsure of fever last night. Can come in for office visit now, scheduled for 1530 with Dr.Lathrop, okay per Dr. Farrel GobbleLathrop.  Routing to provider for final review. Patient agreeable to disposition. Will close encounter

## 2013-12-17 ENCOUNTER — Encounter: Payer: Self-pay | Admitting: Gynecology

## 2014-01-25 ENCOUNTER — Ambulatory Visit: Payer: BC Managed Care – PPO | Admitting: Gynecology

## 2014-01-25 ENCOUNTER — Telehealth: Payer: Self-pay | Admitting: Gynecology

## 2014-01-25 ENCOUNTER — Encounter: Payer: BC Managed Care – PPO | Admitting: Gynecology

## 2014-01-25 NOTE — Progress Notes (Deleted)
28 y.o. married Caucasian female   [phone] here for annual exam. Pt {ACTION; IS/IS [id number]}OT:21021397} currently sexually active.    No LMP recorded.          Sexually active: {yes no:314532}  The current method of family planning is {contraception:315051}.    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 Influenza Vaccine  11/03/2013  . Pap Smear  01/24/2016  . Tetanus/tdap  03/27/2021    Family History  Problem Relation Age of Onset  . Hypertension Father   . Asthma Sister   . Diabetes Paternal Grandfather   . Diabetes Maternal Grandmother   . Breast cancer Maternal Grandmother   . Anesthesia problems Neg Hx   . Hypotension Neg Hx   . Malignant hyperthermia Neg Hx   . Pseudochol deficiency Neg Hx   . Lymphoma Paternal Grandmother     Non Hodgskins Lymphoma    Patient Active Problem List   Diagnosis Date Noted  . S/P endometrial ablation 07/10/2013  . Menorrhagia 01/23/2013    Past Medical History  Diagnosis Date  . No pertinent past medical history   . History of kidney infection during pregnancy     Past Surgical History  Procedure Laterality Date  . Fatty tumor removed    . Mandible fracture surgery    . Wisdom tooth extraction  12 years ago    Allergies: Review of patient's allergies indicates no known allergies.  Current Outpatient Prescriptions  Medication Sig Dispense Refill  . cetirizine (ZYRTEC) 10 MG tablet Take 10 mg by mouth daily.      Marland Kitchen. ibuprofen (ADVIL,MOTRIN) 800 MG tablet Take 1 tablet (800 mg total) by mouth every 8 (eight) hours as needed.  10 tablet  0   No current facility-administered medications for this visit.    ROS: {Ros - complete:30496}  Exam:    There were no vitals taken for this visit. Weight change: @WEIGHTCHANGE @ Last 3 height recordings:  Ht Readings from Last 3 Encounters:  07/10/13 5\' 8"  (1.727 m)  07/03/13 5'  8" (1.727 m)  06/18/13 5\' 8"  (1.727 m)   General appearance: alert, cooperative and appears stated age Head: Normocephalic, without obvious abnormality, atraumatic Neck: no adenopathy, no carotid bruit, no JVD, supple, symmetrical, trachea midline and thyroid not enlarged, symmetric, no tenderness/mass/nodules Lungs: clear to auscultation bilaterally Breasts: normal appearance, no masses or tenderness Heart: regular rate and rhythm, S1, S2 normal, no murmur, click, rub or gallop Abdomen: soft, non-tender; bowel sounds normal; no masses,  no organomegaly Extremities: extremities normal, atraumatic, no cyanosis or edema Skin: Skin color, texture, turgor normal. No rashes or lesions Lymph nodes: Cervical, supraclavicular, and axillary nodes normal. no inguinal nodes palpated Neurologic: Grossly normal   Pelvic: External genitalia:  {Exam; genitalia female:32129}              Urethra: {urethra:311719::"not indicated"}              Bartholins and Skenes: {EXAM; GYN YQIHK:74259}VULVA:14487}                 Vagina: {vagina:315903::"normal appearing vagina with normal color and discharge, no lesions"}              Cervix: {exam; gyn cervix:30847}              Pap taken: {yes no:314532}        Bimanual Exam:  Uterus:  {uterus:315905::"uterus  is normal size, shape, consistency and nontender"}                                      Adnexa:    {adnexa:311645::"not indicated"}                                      Rectovaginal: {Rectovaginal:16320}                                      Anus:  {Exam; anus:16940}  A: {Gyn assessment:5268::"well woman"} Contraceptive management     P: {plan; gyn:5269::"mammogram","pap smear","return annually or prn"}   An After Visit Summary was printed and given to the patient.

## 2014-01-25 NOTE — Telephone Encounter (Signed)
Pt cancelled appointment because she couldn't pay balance. Will call back to reschedule.

## 2014-01-28 NOTE — Progress Notes (Signed)
This encounter was created in error - please disregard.  This encounter was created in error - please disregard.

## 2014-02-01 NOTE — ED Provider Notes (Signed)
Order(s) created erroneously. Erroneous order ID: [id number] Order moved by: Crosley Stejskal F Order move date/time: 02/01/2014  4:19 PM Source Patient:    [id number] Source Contact: 02/06/2013 Destination Patient:   [id number] Destination Contact: 06/20/2012

## 2014-02-04 ENCOUNTER — Encounter: Payer: Self-pay | Admitting: Gynecology

## 2014-02-11 ENCOUNTER — Telehealth: Payer: Self-pay | Admitting: *Deleted

## 2014-02-11 NOTE — Telephone Encounter (Signed)
Patient in for 08 recall had papsmear done 01/23/13 by Dr. Farrel GobbleLathrop it was LGSIL had colpo bx done 02/07/13 the result for that was a repeat papsmear in 1 year.  Patient is not scheduled for AEX  Called patient to try and schedule AEX she said she has a balance and she said we couldn't see her until that balance was paid, tryed to schedule patient but she said she isn't able to pay balance.  Please advise.

## 2014-02-13 NOTE — Telephone Encounter (Signed)
Call to patient, LMTCB. Ask for Kennon RoundsSally, clinical supervisor, I can assist her with account issue and scheduling. LMTCB.

## 2014-02-13 NOTE — Telephone Encounter (Signed)
Return call from patient. Discussed importance of AEX as follow-up to abnormal pap/colpo from last year.  Advised Dr Farrel GobbleLathrop no longer at our office. Patient agreeable to schedule with Dr Hyacinth MeekerMiller since that is who she was initially referred to and family members see. AEX scheduled for 02-21-14 at 300 with Dr Hyacinth MeekerMiller. Recall updated. Soledad GerlachAndrea Alexander (practice administrator) spoke to patient regarding account and payment arrangement made.  Routing to provider for final review. Patient agreeable to disposition. Will close encounter

## 2014-02-13 NOTE — Telephone Encounter (Signed)
Pt needs appt for follow up pap.  Have spoken with Soledad GerlachAndrea Alexander, office manager, regarding this.  She and Billie RuddySally Yeakley, nursing supervisor, will communicate with pt regarding appt and balance.

## 2014-02-15 NOTE — Telephone Encounter (Signed)
Please call Kirsten [facility]Kathryn with [facility]. She called stating that patient needs PUS when she comes in next week.

## 2014-02-15 NOTE — Telephone Encounter (Signed)
Return call to [place]. Patient was seen in their office today for lower abdominal cramping and they are requesting to order PUS at appointment scheduled in our office next week. Advised Kirsten DeistKathryn will need to send OV notes from todays visit for our provider to review. Sonographer not available at currently scheduled appointment time. Dr Hyacinth MeekerMiller can review OV notes and determine next step. Fax number given.

## 2014-02-18 NOTE — Telephone Encounter (Signed)
Fax records received and sent to your office.  Previous patient of Dr Farrel GobbleLathrop, scheduled to see Dr Hyacinth MeekerMiller for AEX on Thursday 02-21-14 at 3pm. Ultrasound will not be availble at that time.

## 2014-02-21 ENCOUNTER — Ambulatory Visit (INDEPENDENT_AMBULATORY_CARE_PROVIDER_SITE_OTHER): Payer: BC Managed Care – PPO | Admitting: Obstetrics & Gynecology

## 2014-02-21 ENCOUNTER — Encounter: Payer: Self-pay | Admitting: Obstetrics & Gynecology

## 2014-02-21 VITALS — BP 100/60 | HR 64 | Resp 16 | Ht 67.25 in | Wt 165.6 lb

## 2014-02-21 DIAGNOSIS — Z124 Encounter for screening for malignant neoplasm of cervix: Secondary | ICD-10-CM

## 2014-02-21 DIAGNOSIS — Z01419 Encounter for gynecological examination (general) (routine) without abnormal findings: Secondary | ICD-10-CM

## 2014-02-21 DIAGNOSIS — Z0000 Encounter for general adult medical examination without abnormal findings: Secondary | ICD-10-CM

## 2014-02-21 DIAGNOSIS — N870 Mild cervical dysplasia: Secondary | ICD-10-CM

## 2014-02-21 DIAGNOSIS — N390 Urinary tract infection, site not specified: Secondary | ICD-10-CM

## 2014-02-21 LAB — POCT URINALYSIS DIPSTICK
Bilirubin, UA: NEGATIVE
GLUCOSE UA: NEGATIVE
Ketones, UA: NEGATIVE
Nitrite, UA: NEGATIVE
PH UA: 7
Protein, UA: NEGATIVE
RBC UA: NEGATIVE
UROBILINOGEN UA: NEGATIVE

## 2014-02-21 MED ORDER — FLUCONAZOLE 150 MG PO TABS: 150.0000 mg | ORAL_TABLET | Freq: Once | ORAL | Status: DC

## 2014-02-21 NOTE — Addendum Note (Signed)
Addended by: Jerene BearsMILLER, Pepper Wyndham S on: 02/21/2014 04:34 PM   Modules accepted: Orders

## 2014-02-21 NOTE — Progress Notes (Addendum)
28 y.o. [id number] MarriedCaucasianF here fo72r annual exam.  Doing well from ablation except now having cramping all the time.  Doesn't take anything for it.  Considering hysterectomy as she doesn't feel she will be able to tolerate this long term.  She will let me know if this worsens but for now, wants to try and wait.  Not taking much for pain.    Strong family hx of breast cancer--mother, maternal grandmother, and maternal great grandmother.  Mother age 30 at diagnosis and had neg genetic testing.    Has been on three rounds of antibiotics for UTI.  Still symptomatic with urgency.  Patient's last menstrual period was 07/03/2013.          Sexually active: Yes.    The current method of family planning is vasectomy.    Exercising: Yes.     Smoker:  Former smoker  Health Maintenance: Pap:  01/23/13 LGSIL, colposcopy 02/07/13 CIN I, endometrial biopsy 06/04/13 normal History of abnormal Pap:  yes MMG:  07/07/09-right breast diag/us-normal Colonoscopy:  none BMD:   none TDaP:  03/28/11 Screening Hb today: 13.4, Urine today: WBC-2+, PH-7.0   reports that she quit smoking about 10 months ago. Her smoking use included Cigarettes. She smoked 0.00 packs per day. She has never used smokeless tobacco. She reports that she does not drink alcohol or use illicit drugs.  Past Medical History  Diagnosis Date  . No pertinent past medical history   . History of kidney infection during pregnancy     Past Surgical History  Procedure Laterality Date  . Fatty tumor removed    . Mandible fracture surgery    . Wisdom tooth extraction  12 years ago    Current Outpatient Prescriptions  Medication Sig Dispense Refill  . fexofenadine (ALLEGRA) 180 MG tablet Take 180 mg by mouth daily.    Marland Kitchen. ibuprofen (ADVIL,MOTRIN) 800 MG tablet Take 1 tablet (800 mg total) by mouth every 8 (eight) hours as needed. 10 tablet 0  . montelukast (SINGULAIR) 10 MG tablet Take 10 mg by mouth at bedtime.    . NON FORMULARY Omega  plex-daily     No current facility-administered medications for this visit.    Family History  Problem Relation Age of Onset  . Hypertension Father   . Asthma Sister   . Diabetes Paternal Grandfather   . Diabetes Maternal Grandmother   . Breast cancer Maternal Grandmother   . Anesthesia problems Neg Hx   . Hypotension Neg Hx   . Malignant hyperthermia Neg Hx   . Pseudochol deficiency Neg Hx   . Lymphoma Paternal Grandmother     Non Hodgskins Lymphoma    ROS:  Pertinent items are noted in HPI.  Otherwise, a comprehensive ROS was negative.  Exam:   BP 100/60 mmHg  Pulse 64  Resp 16  Ht 5' 7.25" (1.708 m)  Wt 165 lb 9.6 oz (75.116 kg)  BMI 25.75 kg/m2  LMP 07/03/2013  Weight change: -3#   Height: 5' 7.25" (170.8 cm)  Ht Readings from Last 3 Encounters:  02/21/14 5' 7.25" (1.708 m)  07/10/13 5\' 8"  (1.727 m)  07/03/13 5\' 8"  (1.727 m)    General appearance: alert, cooperative and appears stated age Head: Normocephalic, without obvious abnormality, atraumatic Neck: no adenopathy, supple, symmetrical, trachea midline and thyroid normal to inspection and palpation Lungs: clear to auscultation bilaterally Breasts: normal appearance, no masses or tenderness Heart: regular rate and rhythm Abdomen: soft, non-tender; bowel sounds normal; no masses,  no organomegaly Extremities: extremities normal, atraumatic, no cyanosis or edema Skin: Skin color, texture, turgor normal. No rashes or lesions Lymph nodes: Cervical, supraclavicular, and axillary nodes normal. No abnormal inguinal nodes palpated Neurologic: Grossly normal   Pelvic: External genitalia:  no lesions              Urethra:  normal appearing urethra with no masses, tenderness or lesions              Bartholins and Skenes: normal                 Vagina: normal appearing vagina with normal color and discharge, no lesions, whitish discharge adherent to vagina              Cervix: no lesions              Pap taken: Yes.    Bimanual Exam:  Uterus:  normal size, contour, position, consistency, mobility, non-tender              Adnexa: normal adnexa and no mass, fullness, tenderness               Rectovaginal: Confirms               Anus:  normal sphincter tone, no lesions  A:  Well Woman Exam H/O endometrial ablation 3/15.  Now with chronic cramping Recent antibiotic use with yeast on physical exam Strong family hx of breast cancer in mother/MGM/MGGM (mother with negative genetic testing) Mild pelvic relaxation LGSIL pap with CIN 1   P:  MMG starting age 540 (at least that is my recommendation today) Diflucan 150mg  po x 1, repeat 48 hours.  Rx to pharmacy. Urine culture to r/o resistant UTI Pap with HR HPV today return annually or prn   An After Visit Summary was printed and given to the patient.

## 2014-02-22 LAB — HEMOGLOBIN, FINGERSTICK: Hemoglobin, fingerstick: 13.4 g/dL (ref 12.0–16.0)

## 2014-02-22 NOTE — Telephone Encounter (Addendum)
Office visit notes faxed with fax confirmation received to Dr. Betty SwazilandJordan attention at [facility] at [phone]. Encounter closed prior.

## 2014-02-22 NOTE — Telephone Encounter (Signed)
Pt seen today.  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  Exam was normal.  She is going to monitor pain and will let me know if worsens.  Aware next step is hysterectomy.  Do we need to communicate back with other office?

## 2014-02-23 LAB — URINE CULTURE
Colony Count: NO GROWTH
Organism ID, Bacteria: NO GROWTH

## 2014-02-25 ENCOUNTER — Other Ambulatory Visit: Payer: Self-pay

## 2014-02-25 NOTE — Telephone Encounter (Signed)
-----   Message from Annamaria BootsMary Suzanne Miller, MD sent at 02/25/2014  1:01 PM EST ----- I really think this is due to the ablation and probable endometriosis.  I'm not sure it will go away until she has definitive surgery.  Should use antiinflammatories.  Naproxyn 500mg  bid if she wants can be called in for her.

## 2014-02-25 NOTE — Telephone Encounter (Signed)
Patient notified. States she would like to try the naproxyn. Is thinking about surgery options, but with a two year old and her husband working 2nd shift, she is hoping to hold off for a little while. Will call back prn. Rx in, but pending ? Refills.//kn

## 2014-02-27 LAB — IPS PAP TEST WITH HPV

## 2014-02-27 MED ORDER — NAPROXEN 500 MG PO TABS: ORAL_TABLET | ORAL | Status: DC

## 2014-03-06 ENCOUNTER — Telehealth: Payer: Self-pay | Admitting: *Deleted

## 2014-03-06 ENCOUNTER — Encounter: Payer: Self-pay | Admitting: Obstetrics & Gynecology

## 2014-03-06 ENCOUNTER — Telehealth: Payer: Self-pay | Admitting: Obstetrics & Gynecology

## 2014-03-06 DIAGNOSIS — IMO0002 Reserved for concepts with insufficient information to code with codable children: Secondary | ICD-10-CM

## 2014-03-06 NOTE — Telephone Encounter (Signed)
PR: $0 °

## 2014-03-06 NOTE — Telephone Encounter (Signed)
2nd referral received for PUS. Office noted were sent on 02-22-14.  Call to Kirsten Kirsten Fischer's office. Spoke to Consolidated Edisonida and given update. Patient seen on 02-22-14 with Kirsten Kirsten Fischer. Normal exam. Kirsten Kirsten Fischer and patient discussed possible post ablation syndrome, patient to monitor pain and let us know if worsens. Patient aware next step is hysterectomy.  Kirsten Fischer transferred me to [facility], referral coordinator who states patient still needs PUS for pain that she has had for several months. Above info repeated and that after exam, Kirsten Kirsten Fischer and patient did not feel PUS needed . Also advised that patient's pap has retruned abnormal and colpo is scheduled for 03-26-14. She will forward this info to Kirsten Fischer. Refaxed OV note, with pap and phone note to Kirsten Kirsten Fischer's office.  Routing to provider for review.

## 2014-03-06 NOTE — Telephone Encounter (Signed)
See notes below... Patient referred for a PUS. I called [facility] to check on this I do not have record of this referral. Records are being faxed to my attention.   ----- Message -----   From: Sharma CovertStarla Curl   Sent: 03/05/2014  1:51 PM    To: Vangie BickerSabrina S Franklin  Subject: referral for existing patient...         Martie LeeSabrina,  Do you know anything about this?  Thank you,  Mervin KungStarla  ----- Message -----   From: Jari Favreebecca E Frahm   Sent: 03/05/2014  9:39 AM    To: Jerre SimonStarla Curl   [facility] referred pt for trans-vag u/s 02/15/14  [phone] Natalia LeatherwoodKatherine   Calling to check status  bf

## 2014-03-06 NOTE — Telephone Encounter (Signed)
Spoke with patient. Advised patient of results as seen below from Dr.Miller. Patient is agreeable and verbalizes understanding. Per patient no longer has menstrual cycles. Patient had Novasure on 06/18/2013 with Dr.Lathrop. Husband had vasectomy. Agreeable to schedule colpo at this time. Appointment scheduled for 12/22 at 3:30pm with Dr.Miller. Patient is agreeable to date and time. Colposcopy pre-procedure instructions given. Motrin instructions given. Motrin=Advil=Ibuprofen Can take 800 mg (Can purchase over the counter, you will need four 200 mg pills) every 8 hours as needed.  Take with food. Make sure to eat a meal before appointment and drink plenty of fluids. Patient verbalized understanding and will call to reschedule if will be on menses or has any concerns regarding pregnancy. Advised will need to cancel within 24 hours or will have $100.00 no show fee placed to account. Order placed for colpo. Will need precert.  Cc: Cathrine MusterSabrina Franklin for precert  Notes Recorded by Annamaria BootsMary Suzanne Miller, MD on 02/27/2014 at 2:18 PM Inform pap is LGSIL. Needs repeat colposcopy. Out of current pap recall

## 2014-03-06 NOTE — Telephone Encounter (Signed)
Notes Recorded by Annamaria BootsMary Suzanne Miller, MD on 02/27/2014 at 2:18 PM Inform pap is LGSIL. Needs repeat colposcopy. Out of current pap recall

## 2014-03-06 NOTE — Telephone Encounter (Signed)
Spoke with patient. Advised patient of results as seen below from Dr.Miller. Patient is agreeable and verbalizes understanding. Per patient no longer has menstrual cycles. Patient had Novasure on 06/18/2013 with Dr.Lathrop. Husband had vasectomy. Agreeable to schedule colpo at this time. Appointment scheduled for 12/22 at 3:30pm with Dr.Miller. Patient is agreeable to date and time. Colposcopy pre-procedure instructions given. Motrin instructions given. Motrin=Advil=Ibuprofen Can take 800 mg (Can purchase over the counter, you will need four 200 mg pills) every 8 hours as needed.  Take with food. Make sure to eat a meal before appointment and drink plenty of fluids. Patient verbalized understanding and will call to reschedule if will be on menses or has any concerns regarding pregnancy. Advised will need to cancel within 24 hours or will have $100.00 no show fee placed to account. Order placed for colpo. Will need precert.   Routing to provider for final review. Patient agreeable to disposition. Will close encounter

## 2014-03-07 NOTE — Telephone Encounter (Signed)
Pt and I will talk about PUS at her colposcopy exam.  I think if referring physician does not want my recommendation, then his office should order it and get pt scheduled for it.  Thanks.  Encounter closed.

## 2014-03-26 ENCOUNTER — Ambulatory Visit (INDEPENDENT_AMBULATORY_CARE_PROVIDER_SITE_OTHER): Payer: BC Managed Care – PPO | Admitting: Obstetrics & Gynecology

## 2014-03-26 VITALS — BP 102/62 | HR 66 | Resp 16 | Wt 171.6 lb

## 2014-03-26 DIAGNOSIS — B977 Papillomavirus as the cause of diseases classified elsewhere: Secondary | ICD-10-CM

## 2014-03-26 DIAGNOSIS — R896 Abnormal cytological findings in specimens from other organs, systems and tissues: Secondary | ICD-10-CM

## 2014-03-26 DIAGNOSIS — IMO0002 Reserved for concepts with insufficient information to code with codable children: Secondary | ICD-10-CM

## 2014-03-26 NOTE — Progress Notes (Signed)
Subjective:     Patient ID: Kirsten BernardLauren R Vaughn, female   DOB: 03/12/1986, 28 y.o.   MRN: [id number]  HPI 28 yo G4P3 MWF here for colposcopy due to LGSIL pap and + HR HPV obtained at [facility] 11/191/5.  Pt with hx of LGSIL pap 01/23/13 and biopsy proven CIN 1 at colposcopy done 02/17/13.  Pt really doesn't have any questions today.    Review of Systems  All other systems reviewed and are negative.      Objective:   Physical Exam  Constitutional: She is oriented to person, place, and time. She appears well-developed and well-nourished.  Genitourinary:    Neurological: She is alert and oriented to person, place, and time.  Skin: Skin is warm and dry.  Psychiatric: She has a normal mood and affect.   Speculum placed.  3% acetic acid applied to cervix for >45 seconds.  Cervix visualized with both 7.5X and 15X magnification.  Green filter also used.  Lugols solution was used.  Findings:  Hypervascular area at 6 o'clock and decreased staining due to Lugol's staining.  Biopsy:  6 and 12.  ECC:  was performed.  Monsel's was needed.  Excellent hemostasis was present.  Pt tolerated procedure well and all instruments were removed.  Findings noted above on picture of cervix.     Assessment:     LGSIL pap with +HR HPV     Plan:     Biopsies and ECC pending.  If CIN 1 or less, repeat Pap and HR HPV 1 year.  If >CIN 1, will plan excisional procedure.

## 2014-04-01 LAB — IPS OTHER TISSUE BIOPSY

## 2014-04-08 ENCOUNTER — Telehealth: Payer: Self-pay | Admitting: Emergency Medicine

## 2014-04-08 NOTE — Telephone Encounter (Signed)
-----   Message from Annamaria Boots, MD sent at 04/01/2014 11:17 AM EST ----- Inform pt biopsies only showed HPV changes.  ECC negative.  Needs repeat Pap and HR HPV testing one year.  Please put in 08 recall.

## 2014-04-08 NOTE — Telephone Encounter (Signed)
08 Recall placed.   Spoke with patient and message from Dr. Hyacinth Meeker given. Patient agreeable.  Patient is scheduled for annual exam with Dr. Hyacinth Meeker 04/2015.  Routing to provider for final review. Patient agreeable to disposition. Will close encounter

## 2014-04-12 ENCOUNTER — Encounter: Payer: Self-pay | Admitting: Obstetrics & Gynecology

## 2014-04-12 MED ORDER — FLUCONAZOLE 150 MG PO TABS: 150.0000 mg | ORAL_TABLET | Freq: Once | ORAL | Status: DC

## 2014-04-12 NOTE — Telephone Encounter (Signed)
Spoke with patient. Advised of message as seen below from Dr.Miller. Patient is agreeable and verbalizes understanding. Rx for Diflucan 150 mg #2 0RF sent to pharmacy on file.  Routing to provider for final review. Patient agreeable to disposition. Will close encounter

## 2014-04-12 NOTE — Telephone Encounter (Signed)
Left message to call Dontell Mian at [phone]. 

## 2014-04-12 NOTE — Telephone Encounter (Signed)
Spoke with patient regarding mychart message to Dr.Miller. Patient states she has vaginal itching and white discharge that has been going on for two days. "I was just seen two weeks ago for a procedure and before that when I had my annual she gave me a prescription for diflucan. The symptoms are the same." Patient requesting rx for yeast infection be sent into pharmacy. "I was just seen on the 22nd." Seen on 03/26/14 with Dr.Miller for colposcopy. Advised will need to speak with Dr.Miller regarding rx and return call.

## 2014-04-12 NOTE — Telephone Encounter (Signed)
Message     OK to treat with Diflucan 150mg  po x 1, repeat 48 hours. Will need OV if occurs again.        MSM    ----- Message -----

## 2014-07-23 ENCOUNTER — Ambulatory Visit (HOSPITAL_BASED_OUTPATIENT_CLINIC_OR_DEPARTMENT_OTHER)
Admission: RE | Admit: 2014-07-23 | Discharge: 2014-07-23 | Disposition: A | Discharge status: Home / Self Care | Payer: BLUE CROSS/BLUE SHIELD | Source: Ambulatory Visit | Attending: Family Medicine | Admitting: Family Medicine

## 2014-07-23 ENCOUNTER — Other Ambulatory Visit (HOSPITAL_BASED_OUTPATIENT_CLINIC_OR_DEPARTMENT_OTHER): Payer: Self-pay | Admitting: Family Medicine

## 2014-07-23 DIAGNOSIS — R1011 Right upper quadrant pain: Secondary | ICD-10-CM | POA: Diagnosis not present

## 2015-01-09 ENCOUNTER — Emergency Department (HOSPITAL_BASED_OUTPATIENT_CLINIC_OR_DEPARTMENT_OTHER)
Admission: EM | Admit: 2015-01-09 | Discharge: 2015-01-09 | Disposition: A | Discharge status: Home / Self Care | Payer: BLUE CROSS/BLUE SHIELD | Attending: Emergency Medicine | Admitting: Emergency Medicine

## 2015-01-09 ENCOUNTER — Encounter (HOSPITAL_BASED_OUTPATIENT_CLINIC_OR_DEPARTMENT_OTHER): Payer: Self-pay | Admitting: *Deleted

## 2015-01-09 DIAGNOSIS — N12 Tubulo-interstitial nephritis, not specified as acute or chronic: Secondary | ICD-10-CM | POA: Diagnosis not present

## 2015-01-09 DIAGNOSIS — Z3202 Encounter for pregnancy test, result negative: Secondary | ICD-10-CM | POA: Diagnosis not present

## 2015-01-09 DIAGNOSIS — Z87891 Personal history of nicotine dependence: Secondary | ICD-10-CM | POA: Diagnosis not present

## 2015-01-09 DIAGNOSIS — N760 Acute vaginitis: Secondary | ICD-10-CM | POA: Diagnosis not present

## 2015-01-09 DIAGNOSIS — B9689 Other specified bacterial agents as the cause of diseases classified elsewhere: Secondary | ICD-10-CM

## 2015-01-09 DIAGNOSIS — J45909 Unspecified asthma, uncomplicated: Secondary | ICD-10-CM | POA: Diagnosis not present

## 2015-01-09 DIAGNOSIS — N898 Other specified noninflammatory disorders of vagina: Secondary | ICD-10-CM | POA: Diagnosis present

## 2015-01-09 HISTORY — DX: Unspecified asthma, uncomplicated: J45.909

## 2015-01-09 LAB — PREGNANCY, URINE: Preg Test, Ur: NEGATIVE

## 2015-01-09 LAB — WET PREP, GENITAL
TRICH WET PREP: NONE SEEN
YEAST WET PREP: NONE SEEN

## 2015-01-09 LAB — URINALYSIS, ROUTINE W REFLEX MICROSCOPIC
Bilirubin Urine: NEGATIVE
Glucose, UA: NEGATIVE mg/dL
HGB URINE DIPSTICK: NEGATIVE
Ketones, ur: NEGATIVE mg/dL
Nitrite: POSITIVE — AB
PROTEIN: NEGATIVE mg/dL
SPECIFIC GRAVITY, URINE: 1.007 (ref 1.005–1.030)
Urobilinogen, UA: 4 mg/dL — ABNORMAL HIGH (ref 0.0–1.0)
pH: 6.5 (ref 5.0–8.0)

## 2015-01-09 LAB — URINE MICROSCOPIC-ADD ON

## 2015-01-09 MED ORDER — METRONIDAZOLE 500 MG PO TABS: 500.0000 mg | ORAL_TABLET | Freq: Three times a day (TID) | ORAL | Status: DC

## 2015-01-09 MED ORDER — ONDANSETRON 4 MG PO TBDP
4.0000 mg | ORAL_TABLET | Freq: Once | ORAL | Status: AC
  Administered 2015-01-09: 4 mg via ORAL
  Filled 2015-01-09: qty 1

## 2015-01-09 MED ORDER — CEFTRIAXONE SODIUM 1 G IJ SOLR
1.0000 g | Freq: Once | INTRAMUSCULAR | Status: AC
  Administered 2015-01-09: 1 g via INTRAMUSCULAR
  Filled 2015-01-09: qty 10

## 2015-01-09 MED ORDER — HYDROCODONE-ACETAMINOPHEN 5-325 MG PO TABS: 2.0000 | ORAL_TABLET | ORAL | Status: DC | PRN

## 2015-01-09 MED ORDER — LIDOCAINE HCL (PF) 1 % IJ SOLN
0.9000 mL | Freq: Once | INTRAMUSCULAR | Status: AC
  Administered 2015-01-09: 1.2 mL
  Filled 2015-01-09: qty 5

## 2015-01-09 MED ORDER — HYDROCODONE-ACETAMINOPHEN 5-325 MG PO TABS
1.0000 | ORAL_TABLET | Freq: Once | ORAL | Status: AC
  Administered 2015-01-09: 1 via ORAL
  Filled 2015-01-09: qty 1

## 2015-01-09 MED ORDER — CIPROFLOXACIN HCL 500 MG PO TABS
500.0000 mg | ORAL_TABLET | Freq: Two times a day (BID) | ORAL | Status: DC
Start: 2015-01-09 — End: 2015-05-08

## 2015-01-09 MED ORDER — LIDOCAINE HCL (PF) 1 % IJ SOLN
INTRAMUSCULAR | Status: AC
  Filled 2015-01-09: qty 5

## 2015-01-09 MED ORDER — ONDANSETRON HCL 4 MG/2ML IJ SOLN: 4.0000 mg | Freq: Once | INTRAMUSCULAR | Status: DC

## 2015-01-09 MED ORDER — ONDANSETRON 4 MG PO TBDP: 4.0000 mg | ORAL_TABLET | Freq: Three times a day (TID) | ORAL | Status: DC | PRN

## 2015-01-09 NOTE — ED Provider Notes (Signed)
CSN: [id number]     Arrival date & time 01/09/15  1807 History  By signing my name below, I, Gwenyth Ober, attest that this documentation has been prepared under the direction and in the presence of Rolland Porter, MD.  Electronically Signed: Gwenyth Ober, ED Scribe. 01/09/2015. 6:31 PM.   Chief Complaint  Patient presents with  . Vaginal Discharge   The history is provided by the patient. No language interpreter was used.    HPI Comments: Kirsten Fischer is a 29 y.o. female with a history of endometrial ablation and recurrent UTI who presents to the Emergency Department complaining of intermittent, moderate cramping lower abdominal pain that started 5 days ago. She states lower back pain, nausea, fever measuring 101, chills and urinary frequency as associated symptoms. Pt also notes yellow vaginal discharge that started 2 days ago after she was started on Macrobid. Pt was seen by Urgent Care 2 days ago who diagnosed her with a UTI. She did not have any imaging or lab work done at that time. Pt had endometrial ablation 1 year ago and states she has had recurrent UTIs since then. 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 Pt denies dysuria, vaginal bleeding and vomiting.  Past Medical History  Diagnosis Date  . No pertinent past medical history   . History of kidney infection during pregnancy   . Asthma    Past Surgical History  Procedure Laterality Date  . Fatty tumor removed    . Mandible fracture surgery    . Wisdom tooth extraction  12 years ago   Family History  Problem Relation Age of Onset  . Hypertension Father   . Asthma Sister   . Diabetes Paternal Grandfather   . Diabetes Maternal Grandmother   . Breast cancer Maternal Grandmother   . Anesthesia problems Neg Hx   . Hypotension Neg Hx   . Malignant hyperthermia Neg Hx   . Pseudochol deficiency Neg Hx   . Lymphoma Paternal Grandmother     Non Hodgskins Lymphoma  . Breast cancer Mother 51   Social History  Substance Use  Topics  . Smoking status: Former Smoker    Types: Cigarettes    Quit date: 04/20/2013  . Smokeless tobacco: Never Used  . Alcohol Use: No   OB History    Gravida Para Term Preterm AB TAB SAB Ectopic Multiple Living   0 1 0 1 0 0 3     Review of Systems  Constitutional: Positive for fever and chills. Negative for diaphoresis, appetite change and fatigue.  HENT: Negative for mouth sores, sore throat and trouble swallowing.   Eyes: Negative for visual disturbance.  Respiratory: Negative for cough, chest tightness, shortness of breath and wheezing.   Cardiovascular: Negative for chest pain.  Gastrointestinal: Positive for nausea and abdominal pain. Negative for vomiting, diarrhea and abdominal distention.  Endocrine: Negative for polydipsia, polyphagia and polyuria.  Genitourinary: Positive for frequency and vaginal discharge. Negative for dysuria, hematuria and vaginal bleeding.  Musculoskeletal: Positive for back pain. Negative for gait problem.  Skin: Negative for color change, pallor and rash.  Neurological: Negative for dizziness, syncope, light-headedness and headaches.  Hematological: Does not bruise/bleed easily.  Psychiatric/Behavioral: Negative for behavioral problems and confusion.   Allergies  Review of patient's allergies indicates no known allergies.  Home Medications   Prior to Admission medications   Medication Sig Start Date End Date Taking? Authorizing Provider  nitrofurantoin, macrocrystal-monohydrate, (MACROBID) 100 MG capsule Take 100 mg by mouth 2 (  two) times daily.   Yes Historical Provider, MD  ciprofloxacin (CIPRO) 500 MG tablet Take 1 tablet (500 mg total) by mouth every 12 (twelve) hours. 01/09/15   Rolland Porter, MD  fexofenadine (ALLEGRA) 180 MG tablet Take 180 mg by mouth daily.    Historical Provider, MD  fluconazole (DIFLUCAN) 150 MG tablet Take 1 tablet (150 mg total) by mouth once. 04/12/14   Jerene Bears, MD  HYDROcodone-acetaminophen  (NORCO/VICODIN) 5-325 MG tablet Take 2 tablets by mouth every 4 (four) hours as needed. 01/09/15   Rolland Porter, MD  ibuprofen (ADVIL,MOTRIN) 800 MG tablet Take 1 tablet (800 mg total) by mouth every 8 (eight) hours as needed. 06/04/13   Douglass Rivers, MD  metroNIDAZOLE (FLAGYL) 500 MG tablet Take 1 tablet (500 mg total) by mouth 3 (three) times daily. 01/09/15   Rolland Porter, MD  montelukast (SINGULAIR) 10 MG tablet Take 10 mg by mouth at bedtime.    Historical Provider, MD  naproxen (NAPROSYN) 500 MG tablet Take  BID daily with meal as needed 02/27/14   Jerene Bears, MD  NON FORMULARY Omega plex-daily    Historical Provider, MD  ondansetron (ZOFRAN ODT) 4 MG disintegrating tablet Take 1 tablet (4 mg total) by mouth every 8 (eight) hours as needed for nausea. 01/09/15   Rolland Porter, MD   BP 113/76 mmHg  Pulse 82  Temp(Src) 98.7 F (37.1 C) (Oral)  Resp 18  Ht 5' 7.5" (1.715 m)  Wt 144 lb (65.318 kg)  BMI 22.21 kg/m2  SpO2 98% Physical Exam  Constitutional: She is oriented to person, place, and time. She appears well-developed and well-nourished. No distress.  HENT:  Head: Normocephalic.  Eyes: Conjunctivae are normal. Pupils are equal, round, and reactive to light. No scleral icterus.  Neck: Normal range of motion. Neck supple. No thyromegaly present.  Cardiovascular: Normal rate and regular rhythm.  Exam reveals no gallop and no friction rub.   No murmur heard. Pulmonary/Chest: Effort normal and breath sounds normal. No respiratory distress. She has no wheezes. She has no rales.  Abdominal: Soft. Bowel sounds are normal. She exhibits no distension. There is no tenderness. There is CVA tenderness (bilateral). There is no rebound.  Musculoskeletal: Normal range of motion.  Neurological: She is alert and oriented to person, place, and time.  Skin: Skin is warm and dry. No rash noted.  Psychiatric: She has a normal mood and affect. Her behavior is normal.  Nursing note and vitals  reviewed.   ED Course  Procedures   DIAGNOSTIC STUDIES: Oxygen Saturation is 98% on RA, normal by my interpretation.    COORDINATION OF CARE: 6:31 PM Discussed treatment plan with pt at bedside and pt agreed to plan.  Labs Review Labs Reviewed  WET PREP, GENITAL - Abnormal; Notable for the following:    Clue Cells Wet Prep HPF POC MODERATE (*)    WBC, Wet Prep HPF POC FEW (*)    All other components within normal limits  URINALYSIS, ROUTINE W REFLEX MICROSCOPIC (NOT AT [facility]) - Abnormal; Notable for the following:    Color, Urine ORANGE (*)    APPearance CLOUDY (*)    Urobilinogen, UA 4.0 (*)    Nitrite POSITIVE (*)    Leukocytes, UA MODERATE (*)    All other components within normal limits  URINE MICROSCOPIC-ADD ON - Abnormal; Notable for the following:    Squamous Epithelial / LPF FEW (*)    Bacteria, UA FEW (*)    All other  components within normal limits  PREGNANCY, URINE  GC/CHLAMYDIA PROBE AMP (Trainer) NOT AT [facility]    Imaging Review No results found. I have personally reviewed and evaluated these lab results as part of my medical decision-making.   EKG Interpretation None      MDM   Final diagnoses:  Pyelonephritis  Bacterial vaginosis    Urine still appears infected. Culture obtained pending. Has clue cells on swab. Plan will be 7 days twice a day Cipro. Given IM Rocephin here. Flagyl after completion of her Cipro. Primary care follow-up.  I personally performed the services described in this documentation, which was scribed in my presence. The recorded information has been reviewed and is accurate. t}   Rolland Porter, MD 01/09/15 2018

## 2015-01-09 NOTE — ED Notes (Signed)
Was treated for a UTI Tuesday. Vaginal discharge. States she has a fever.

## 2015-01-09 NOTE — Discharge Instructions (Signed)
Bacterial Vaginosis Bacterial vaginosis is a vaginal infection that occurs when the normal balance of bacteria in the vagina is disrupted. It results from an overgrowth of certain bacteria. This is the most common vaginal infection in women of childbearing age. Treatment is important to prevent complications, especially in pregnant women, as it can cause a premature delivery. CAUSES  Bacterial vaginosis is caused by an increase in harmful bacteria that are normally present in smaller amounts in the vagina. Several different kinds of bacteria can cause bacterial vaginosis. However, the reason that the condition develops is not fully understood. RISK FACTORS Certain activities or behaviors can put you at an increased risk of developing bacterial vaginosis, including:  Having a new sex partner or multiple sex partners.  Douching.  Using an intrauterine device (IUD) for contraception. Women do not get bacterial vaginosis from toilet seats, bedding, swimming pools, or contact with objects around them. SIGNS AND SYMPTOMS  Some women with bacterial vaginosis have no signs or symptoms. Common symptoms include:  Grey vaginal discharge.  A fishlike odor with discharge, especially after sexual intercourse.  Itching or burning of the vagina and vulva.  Burning or pain with urination. DIAGNOSIS  Your health care provider will take a medical history and examine the vagina for signs of bacterial vaginosis. A sample of vaginal fluid may be taken. Your health care provider will look at this sample under a microscope to check for bacteria and abnormal cells. A vaginal pH test may also be done.  TREATMENT  Bacterial vaginosis may be treated with antibiotic medicines. These may be given in the form of a pill or a vaginal cream. A second round of antibiotics may be prescribed if the condition comes back after treatment. Because bacterial vaginosis increases your risk for sexually transmitted diseases, getting  treated can help reduce your risk for chlamydia, gonorrhea, HIV, and herpes. HOME CARE INSTRUCTIONS   Only take over-the-counter or prescription medicines as directed by your health care provider.  If antibiotic medicine was prescribed, take it as directed. Make sure you finish it even if you start to feel better.  Tell all sexual partners that you have a vaginal infection. They should see their health care provider and be treated if they have problems, such as a mild rash or itching.  During treatment, it is important that you follow these instructions:  Avoid sexual activity or use condoms correctly.  Do not douche.  Avoid alcohol as directed by your health care provider.  Avoid breastfeeding as directed by your health care provider. SEEK MEDICAL CARE IF:   Your symptoms are not improving after 3 days of treatment.  You have increased discharge or pain.  You have a fever. MAKE SURE YOU:   Understand these instructions.  Will watch your condition.  Will get help right away if you are not doing well or get worse. FOR MORE INFORMATION  Centers for Disease Control and Prevention, Division of STD Prevention: [employer]Apps.[employer]za American Sexual Health Association (ASHA): www.ashastd.org    This information is not intended to replace advice given to you by your health care provider. Make sure you discuss any questions you have with your health care provider.   Document Released: 03/22/2005 Document Revised: 04/12/2014 Document Reviewed: 11/01/2012 Elsevier Interactive Patient Education 2016 Elsevier Inc.  Pyelonephritis, Adult Pyelonephritis is a kidney infection. The kidneys are organs that help clean your blood by moving waste out of your blood and into your pee (urine). This infection can happen quickly, or it can  last for a long time. In most cases, it clears up with treatment and does not cause other problems. HOME CARE Medicines  Take over-the-counter and prescription  medicines only as told by your doctor.  Take your antibiotic medicine as told by your doctor. Do not stop taking the medicine even if you start to feel better. General Instructions  Drink enough fluid to keep your pee clear or pale yellow.  Avoid caffeine, tea, and carbonated drinks.  Pee (urinate) often. Avoid holding in pee for long periods of time.  Pee before and after sex.  After pooping (having a bowel movement), women should wipe from front to back. Use each tissue only once.  Keep all follow-up visits as told by your doctor. This is important. GET HELP IF:  You do not feel better after 2 days.  Your symptoms get worse.  You have a fever. GET HELP RIGHT AWAY IF:  You cannot take your medicine or drink fluids as told.  You have chills and shaking.  You throw up (vomit).  You have very bad pain in your side (flank) or back.  You feel very weak or you pass out (faint).   This information is not intended to replace advice given to you by your health care provider. Make sure you discuss any questions you have with your health care provider.   Document Released: 04/29/2004 Document Revised: 12/11/2014 Document Reviewed: 07/15/2014 Elsevier Interactive Patient Education [employer]

## 2015-01-09 NOTE — ED Notes (Signed)
Stirrups attached and Pelvic cart at bedside; awaiting MD

## 2015-01-10 LAB — GC/CHLAMYDIA PROBE AMP (~~LOC~~) NOT AT ARMC
CHLAMYDIA, DNA PROBE: NEGATIVE
NEISSERIA GONORRHEA: NEGATIVE

## 2015-04-25 ENCOUNTER — Ambulatory Visit: Payer: BC Managed Care – PPO | Admitting: Obstetrics & Gynecology

## 2015-05-08 ENCOUNTER — Emergency Department (HOSPITAL_BASED_OUTPATIENT_CLINIC_OR_DEPARTMENT_OTHER)
Admission: EM | Admit: 2015-05-08 | Discharge: 2015-05-08 | Disposition: A | Discharge status: Home / Self Care | Payer: BLUE CROSS/BLUE SHIELD | Attending: Emergency Medicine | Admitting: Emergency Medicine

## 2015-05-08 ENCOUNTER — Encounter (HOSPITAL_BASED_OUTPATIENT_CLINIC_OR_DEPARTMENT_OTHER): Payer: Self-pay | Admitting: *Deleted

## 2015-05-08 DIAGNOSIS — Z79899 Other long term (current) drug therapy: Secondary | ICD-10-CM | POA: Insufficient documentation

## 2015-05-08 DIAGNOSIS — Z87891 Personal history of nicotine dependence: Secondary | ICD-10-CM | POA: Insufficient documentation

## 2015-05-08 DIAGNOSIS — N390 Urinary tract infection, site not specified: Secondary | ICD-10-CM | POA: Insufficient documentation

## 2015-05-08 DIAGNOSIS — Z3202 Encounter for pregnancy test, result negative: Secondary | ICD-10-CM | POA: Insufficient documentation

## 2015-05-08 DIAGNOSIS — Z792 Long term (current) use of antibiotics: Secondary | ICD-10-CM | POA: Insufficient documentation

## 2015-05-08 DIAGNOSIS — J45909 Unspecified asthma, uncomplicated: Secondary | ICD-10-CM | POA: Insufficient documentation

## 2015-05-08 LAB — URINALYSIS, ROUTINE W REFLEX MICROSCOPIC
Bilirubin Urine: NEGATIVE
GLUCOSE, UA: NEGATIVE mg/dL
Hgb urine dipstick: NEGATIVE
Ketones, ur: NEGATIVE mg/dL
NITRITE: NEGATIVE
PROTEIN: NEGATIVE mg/dL
Specific Gravity, Urine: 1.008 (ref 1.005–1.030)
pH: 6 (ref 5.0–8.0)

## 2015-05-08 LAB — URINE MICROSCOPIC-ADD ON

## 2015-05-08 LAB — PREGNANCY, URINE: PREG TEST UR: NEGATIVE

## 2015-05-08 MED ORDER — CEPHALEXIN 500 MG PO CAPS: 500.0000 mg | ORAL_CAPSULE | Freq: Three times a day (TID) | ORAL | Status: DC

## 2015-05-08 NOTE — ED Notes (Signed)
Patient states she has a three week history of strong smelling urine and frequency.  States this week she developed pain in her right lower back.  History of UTI's with kidney infections.

## 2015-05-08 NOTE — ED Provider Notes (Signed)
CSN: [id number]     Arrival date & time 05/08/15  4098 History   First MD Initiated Contact with Patient 05/08/15 0827     Chief Complaint  Patient presents with  . Urinary Tract Infection     (Consider location/radiation/quality/duration/timing/severity/associated sxs/prior Treatment) Patient is a 30 y.o. female presenting with urinary tract infection. The history is provided by the patient.  Urinary Tract Infection Pain quality:  Aching Pain severity:  Moderate Onset quality:  Gradual Duration:  2 weeks Timing:  Constant Progression:  Unchanged Chronicity:  New Recent urinary tract infections: yes   Relieved by:  Nothing Worsened by:  Nothing tried Ineffective treatments:  None tried Urinary symptoms: foul-smelling urine   Associated symptoms: no abdominal pain, no fever and no vomiting   Risk factors: hx of pyelonephritis   Risk factors: not pregnant     Past Medical History  Diagnosis Date  . No pertinent past medical history   . History of kidney infection during pregnancy   . Asthma    Past Surgical History  Procedure Laterality Date  . Fatty tumor removed    . Mandible fracture surgery    . Wisdom tooth extraction  12 years ago   Family History  Problem Relation Age of Onset  . Hypertension Father   . Asthma Sister   . Diabetes Paternal Grandfather   . Diabetes Maternal Grandmother   . Breast cancer Maternal Grandmother   . Anesthesia problems Neg Hx   . Hypotension Neg Hx   . Malignant hyperthermia Neg Hx   . Pseudochol deficiency Neg Hx   . Lymphoma Paternal Grandmother     Non Hodgskins Lymphoma  . Breast cancer Mother 31   Social History  Substance Use Topics  . Smoking status: Former Smoker    Types: Cigarettes    Quit date: 04/20/2013  . Smokeless tobacco: Never Used  . Alcohol Use: No   OB History    Gravida Para Term Preterm AB TAB SAB Ectopic Multiple Living   0 1 0 1 0 0 3     Review of Systems  Constitutional: Negative for  fever.  Gastrointestinal: Negative for vomiting and abdominal pain.  All other systems reviewed and are negative.     Allergies  Review of patient's allergies indicates no known allergies.  Home Medications   Prior to Admission medications   Medication Sig Start Date End Date Taking? Authorizing Provider  fexofenadine (ALLEGRA) 180 MG tablet Take 180 mg by mouth daily.   Yes Historical Provider, MD  ciprofloxacin (CIPRO) 500 MG tablet Take 1 tablet (500 mg total) by mouth every 12 (twelve) hours. 01/09/15   Rolland Porter, MD  fluconazole (DIFLUCAN) 150 MG tablet Take 1 tablet (150 mg total) by mouth once. 04/12/14   Jerene Bears, MD  HYDROcodone-acetaminophen (NORCO/VICODIN) 5-325 MG tablet Take 2 tablets by mouth every 4 (four) hours as needed. 01/09/15   Rolland Porter, MD  ibuprofen (ADVIL,MOTRIN) 800 MG tablet Take 1 tablet (800 mg total) by mouth every 8 (eight) hours as needed. 06/04/13   Douglass Rivers, MD  metroNIDAZOLE (FLAGYL) 500 MG tablet Take 1 tablet (500 mg total) by mouth 3 (three) times daily. 01/09/15   Rolland Porter, MD  montelukast (SINGULAIR) 10 MG tablet Take 10 mg by mouth at bedtime.    Historical Provider, MD  naproxen (NAPROSYN) 500 MG tablet Take  BID daily with meal as needed 02/27/14   Jerene Bears, MD  nitrofurantoin, Gara Kroner, [facility])  100 MG capsule Take 100 mg by mouth 2 (two) times daily.    Historical Provider, MD  NON FORMULARY Omega plex-daily    Historical Provider, MD  ondansetron (ZOFRAN ODT) 4 MG disintegrating tablet Take 1 tablet (4 mg total) by mouth every 8 (eight) hours as needed for nausea. 01/09/15   Rolland Porter, MD   BP 113/79 mmHg  Pulse 79  Temp(Src) 98 F (36.7 C) (Oral)  Resp 18  Ht  (1.727 m)  Wt 150 lb (68.04 kg)  BMI 22.81 kg/m2  SpO2 100% Physical Exam  Constitutional: She is oriented to person, place, and time. She appears well-developed and well-nourished. No distress.  HENT:  Head: Normocephalic.  Eyes:  Conjunctivae are normal.  Neck: Neck supple. No tracheal deviation present.  Cardiovascular: Normal rate and regular rhythm.   Pulmonary/Chest: Effort normal. No respiratory distress.  Abdominal: Soft. She exhibits no distension. There is no tenderness. There is no rebound and no guarding.  Neurological: She is alert and oriented to person, place, and time.  Skin: Skin is warm and dry.  Psychiatric: She has a normal mood and affect.  Vitals reviewed.   ED Course  Procedures (including critical care time) Labs Review Labs Reviewed  URINALYSIS, ROUTINE W REFLEX MICROSCOPIC (NOT AT [facility]) - Abnormal; Notable for the following:    APPearance CLOUDY (*)    Leukocytes, UA MODERATE (*)    All other components within normal limits  URINE MICROSCOPIC-ADD ON - Abnormal; Notable for the following:    Squamous Epithelial / LPF 6-30 (*)    Bacteria, UA MANY (*)    All other components within normal limits  URINE CULTURE  PREGNANCY, URINE    Imaging Review No results found. I have personally reviewed and evaluated these images and lab results as part of my medical decision-making.   EKG Interpretation None      MDM   Final diagnoses:  Urinary tract infection without hematuria, site unspecified    30 y.o. female presents with typical urinary tract infection symptoms of lower back pain and urinary frequency with odor that she has gotten in the past which resolved with antibiotics. 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 Will treat empirically with Keflex pending repeat culture. No clinical signs of disseminated infection or pyelonephritis currently. Patient needs to establish primary care in the area and was provided contact information to do so.    Lyndal Pulley, MD 05/08/15 [id number]

## 2015-05-08 NOTE — Discharge Instructions (Signed)

## 2015-05-11 LAB — URINE CULTURE: Culture: >=100000

## 2015-05-12 ENCOUNTER — Telehealth (HOSPITAL_BASED_OUTPATIENT_CLINIC_OR_DEPARTMENT_OTHER): Payer: Self-pay | Admitting: Emergency Medicine

## 2015-05-12 NOTE — Telephone Encounter (Signed)
Post ED Visit - Positive Culture Follow-up  Culture report reviewed by antimicrobial stewardship pharmacist:   Enzo Bi, Pharm.D.  Celedonio Miyamoto, Pharm.D., BCPS  Garvin Fila, Pharm.D.  Georgina Pillion, Pharm.D., BCPS  Calcium, [place].D., BCPS, AAHIVP  Estella Husk, Pharm.D., BCPS, AAHIVP  Tennis Must, Pharm.D.  Sherle Poe, [address].D.  Positive urine culture Klebsiella Treated with cephalexin, organism sensitive to the same and no further patient follow-up is required at this time.  Berle Mull 05/12/2015, 9:03 AM

## 2015-06-11 DIAGNOSIS — N390 Urinary tract infection, site not specified: Secondary | ICD-10-CM | POA: Insufficient documentation

## 2015-06-18 DIAGNOSIS — F902 Attention-deficit hyperactivity disorder, combined type: Secondary | ICD-10-CM | POA: Insufficient documentation

## 2015-08-14 ENCOUNTER — Ambulatory Visit (INDEPENDENT_AMBULATORY_CARE_PROVIDER_SITE_OTHER): Payer: PRIVATE HEALTH INSURANCE | Admitting: Obstetrics & Gynecology

## 2015-08-14 ENCOUNTER — Encounter: Payer: Self-pay | Admitting: Obstetrics & Gynecology

## 2015-08-14 VITALS — BP 116/62 | HR 88 | Temp 98.3°F | Resp 16 | Ht 68.0 in | Wt 149.0 lb

## 2015-08-14 DIAGNOSIS — N390 Urinary tract infection, site not specified: Secondary | ICD-10-CM | POA: Diagnosis not present

## 2015-08-14 DIAGNOSIS — Z01419 Encounter for gynecological examination (general) (routine) without abnormal findings: Secondary | ICD-10-CM

## 2015-08-14 DIAGNOSIS — Z0000 Encounter for general adult medical examination without abnormal findings: Secondary | ICD-10-CM | POA: Diagnosis not present

## 2015-08-14 DIAGNOSIS — Z124 Encounter for screening for malignant neoplasm of cervix: Secondary | ICD-10-CM

## 2015-08-14 LAB — POCT URINALYSIS DIPSTICK
Bilirubin, UA: NEGATIVE
Blood, UA: NEGATIVE
Glucose, UA: NEGATIVE
Ketones, UA: NEGATIVE
NITRITE UA: NEGATIVE
PH UA: 8
PROTEIN UA: NEGATIVE
Urobilinogen, UA: NEGATIVE

## 2015-08-14 NOTE — Progress Notes (Signed)
30 y.o. [id number] MarriedCaucasianF here for annual exam.   Pt having issues with recurrent UTIs.  Has been seen by providers in [facility].   Cultures grew, at different, times, Klebsiella, strep, and Lactobaccillus.  She were on five different antibiotics before this was all cleared including Cipro, Keflex, and Augmentin.  She was also on a suppressive antibiotic--keflex 500mg  daily.  [facility].  Notes reviewed.  Renal U/S and bladder scan were negative.  No LMP recorded. Patient has had an ablation.          Sexually active: Yes.    The current method of family planning is condoms most of the time.    Exercising: Yes.    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 Repeat pap and HR HPV in one year. 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 Her smoking use included Cigarettes. She has never used smokeless tobacco. She reports that she does not drink alcohol or use illicit drugs.  Past Medical History  Diagnosis Date  . No pertinent past medical history   . History of kidney infection during pregnancy   . Asthma     Past Surgical History  Procedure Laterality Date  . Fatty tumor removed    . Mandible fracture surgery    . Wisdom tooth extraction  12 years ago    Current Outpatient Prescriptions  Medication Sig Dispense Refill  . albuterol (PROVENTIL HFA;VENTOLIN HFA) 108 (90 Base) MCG/ACT inhaler Inhale into the lungs.    Marland Kitchen. amphetamine-dextroamphetamine (ADDERALL) 20 MG tablet Take 20 mg by mouth 2 (two) times daily.    . montelukast (SINGULAIR) 10 MG tablet Take  10 mg by mouth.     No current facility-administered medications for this visit.    Family History  Problem Relation Age of Onset  . Hypertension Father   . Asthma Sister   . Diabetes Paternal Grandfather   . Diabetes Maternal Grandmother   . Breast cancer Maternal Grandmother   . Anesthesia problems Neg Hx   . Hypotension Neg Hx   . Malignant hyperthermia Neg Hx   . Pseudochol deficiency Neg Hx   . Lymphoma Paternal Grandmother     Non Hodgskins Lymphoma  . Breast cancer Mother [id number]    ROS:  Pertinent items are noted in HPI.  Otherwise, a comprehensive ROS was negative.  Exam:   BP 116/62 mmHg  Pulse 88  Temp(Src) 98.3 F (36.8 C)  Resp 16  Ht 5\' 8"  (1.727 m)  Wt 149 lb (67.586 kg)  BMI 22.66 kg/m2    Height: 5\' 8"  (172.7 cm)  Ht Readings from Last 3 Encounters:  08/14/15 5\' 8"  (1.727 m)  05/08/15 5\' 8"  (1.727 m)  01/09/15 5' 7.5" (1.715 m)    General appearance: alert, cooperative and appears stated age Head: Normocephalic, without obvious abnormality, atraumatic Neck: no adenopathy, supple, symmetrical, trachea midline and thyroid normal to inspection and palpation Lungs: clear to auscultation bilaterally Breasts: normal appearance, no masses or tenderness Heart: regular rate and rhythm Abdomen: soft, non-tender; bowel sounds normal; no masses,  no organomegaly Extremities: extremities normal, atraumatic, no cyanosis or edema Skin: Skin color, texture, turgor normal. No rashes or lesions Lymph nodes: Cervical, supraclavicular, and axillary nodes normal. No abnormal inguinal nodes palpated Neurologic: Grossly normal   Pelvic: External genitalia:  no lesions              Urethra:  normal appearing urethra with no masses, tenderness or lesions              Bartholins and Skenes: normal                 Vagina: normal appearing vagina with normal color and discharge, no lesions              Cervix: no lesions              Pap taken: Yes.   Bimanual Exam:  Uterus:   normal size, contour, position, consistency, mobility, non-tender              Adnexa: normal adnexa and no mass, fullness, tenderness               Rectovaginal: Confirms               Anus:  normal sphincter tone, no lesions  Chaperone was present for exam.  A:  Well Woman Exam H/O endometrial ablation 3/15. Now with chronic cramping Strong family hx of breast cancer in mother/MGM/MGGM (mother with negative genetic testing) Mild pelvic relaxation LGSIL pap with CIN 1 Recurrent UTI   P: MMG starting age 20 Urine culture today.  Consider trimethoprim for suppression and may need cystoscopy, as this hasn't been done Pap with HR HPV today return annually or prn

## 2015-08-15 LAB — URINE CULTURE
COLONY COUNT: NO GROWTH
ORGANISM ID, BACTERIA: NO GROWTH

## 2015-08-18 LAB — IPS PAP TEST WITH HPV

## 2015-08-19 ENCOUNTER — Telehealth: Payer: Self-pay | Admitting: Emergency Medicine

## 2015-08-19 DIAGNOSIS — R87612 Low grade squamous intraepithelial lesion on cytologic smear of cervix (LGSIL): Secondary | ICD-10-CM

## 2015-08-19 DIAGNOSIS — R87810 Cervical high risk human papillomavirus (HPV) DNA test positive: Secondary | ICD-10-CM

## 2015-08-19 NOTE — Telephone Encounter (Signed)
-----   Message from Jerene BearsMary S Miller, MD sent at 08/18/2015  5:47 PM EDT ----- Please call pt and let her know that her Pap was still abnormal.  LGSIL with +HR HPV.  Needs colposcopy.  Please call and schedule.

## 2015-08-19 NOTE — Telephone Encounter (Signed)
Call to patient. She is advised of Pap smear results with LGSIL and positive HR HPV.  Agreeable to scheduling colposcopy with Dr. Hyacinth MeekerMiller. Scheduled for 08/29/15 at 1530.    Instructions given. Motrin 800 mg po one hour before appointment with food. Make sure to eat a meal before appointment and drink plenty of fluids.  Patient hx of Ablation.   Order placed and patient aware she will be contacted with insurance coverage. cc Harland DingwallSuzy Dixon for insurance pre-certification, referral processing and patient contact.   Routing to provider for final review. Patient agreeable to disposition. Will close encounter.

## 2015-08-29 ENCOUNTER — Encounter: Payer: Self-pay | Admitting: Obstetrics & Gynecology

## 2015-08-29 ENCOUNTER — Ambulatory Visit (INDEPENDENT_AMBULATORY_CARE_PROVIDER_SITE_OTHER): Payer: PRIVATE HEALTH INSURANCE | Admitting: Obstetrics & Gynecology

## 2015-08-29 DIAGNOSIS — R87612 Low grade squamous intraepithelial lesion on cytologic smear of cervix (LGSIL): Secondary | ICD-10-CM

## 2015-08-29 DIAGNOSIS — R87810 Cervical high risk human papillomavirus (HPV) DNA test positive: Secondary | ICD-10-CM | POA: Diagnosis not present

## 2015-08-29 MED ORDER — TRIMETHOPRIM 100 MG PO TABS: 100.0000 mg | ORAL_TABLET | Freq: Two times a day (BID) | ORAL | Status: DC

## 2015-08-29 NOTE — Progress Notes (Deleted)
Patient ID: Kirsten Fischer, female   DOB: 04/30/1985, 30 y.o.   MRN: [id number]  Chief Complaint  Patient presents with  . Procedure    colposcopy, took ibuprofen prior to visit/ EC    HPI Kirsten Fischer is a 30 y.o. female.  *** HPI  Indications: Pap smear on {MONTH:10108} 20*** showed: {Findings; lab pap smear results:16707::"no abnormalities"}. Previous colposcopy: {Exam; colposcopy summary:733::"in ***"}. Prior cervical treatment: {Therapies; recommendations colposcopy:727}.  Past Medical History  Diagnosis Date  . No pertinent past medical history   . History of kidney infection during pregnancy   . Asthma     Past Surgical History  Procedure Laterality Date  . Fatty tumor removed    . Mandible fracture surgery    . Wisdom tooth extraction  12 years ago    Family History  Problem Relation Age of Onset  . Hypertension Father   . Asthma Sister   . Diabetes Paternal Grandfather   . Diabetes Maternal Grandmother   . Breast cancer Maternal Grandmother   . Anesthesia problems Neg Hx   . Hypotension Neg Hx   . Malignant hyperthermia Neg Hx   . Pseudochol deficiency Neg Hx   . Lymphoma Paternal Grandmother     Non Hodgskins Lymphoma  . Breast cancer Mother [id number]    Social History Social History  Substance Use Topics  . Smoking status: Former Smoker    Types: Cigarettes    Quit date: 04/20/2013  . Smokeless tobacco: Never Used  . Alcohol Use: No    Allergies  Allergen Reactions  . Nitrofurantoin Hives    Current Outpatient Prescriptions  Medication Sig Dispense Refill  . albuterol (PROVENTIL HFA;VENTOLIN HFA) 108 (90 Base) MCG/ACT inhaler Inhale into the lungs.    Marland Kitchen. amphetamine-dextroamphetamine (ADDERALL) 20 MG tablet Take 20 mg by mouth 2 (two) times daily.    . montelukast (SINGULAIR) 10 MG tablet Take 10 mg by mouth.    . trimethoprim (TRIMPEX) 100 MG tablet Take 1 tablet (100 mg total) by mouth 2 (two) times daily. 30 tablet 3   No current  facility-administered medications for this visit.    Review of Systems Review of Systems  Blood pressure 104/52, pulse 58, resp. rate 16, weight 146 lb 6.4 oz (66.407 kg).  Physical Exam Physical Exam  Data Reviewed ***  Assessment    Procedure Details  The risks and benefits of the procedure and Written informed consent obtained.  Speculum placed in vagina and excellent visualization of cervix achieved, cervix swabbed x 3 with acetic acid solution.  Specimens: ***  Complications: none.     Plan    Specimens labelled and sent to Pathology. Return to discuss Pathology results in 2 weeks.      Valentina ShaggyMILLER, Delmore Sear SUZANNE 08/29/2015, 4:04 PM

## 2015-08-30 NOTE — Progress Notes (Signed)
Patient ID: Kirsten BernardLauren R Vaughn, female   DOB: 02/07/1986, 30 y.o.   MRN: [id number]  30 y.o. Married Not Hispanic or Latino female here for colposcopy with possible biopsies and/or ECC due to LGSIL Pap obtained 08/14/15.  Pt has hx of LGSIL pap smear with abnormal in 10/14 and 11/15.  She had a colposcopy after each of these abnormal pap smears. Biopsies have showed both CIN 1 and just HPV effect at well.  Pt has been contemplating hysterectomy due to persistent cerivcal abnormality as well as dysmenorrhea and low back pain that occurs with menstrual cycle.  She has undergone an endometrial ablation and her bleeding is minimal but her pain is fairly significant each month.    No LMP recorded. Patient has had an ablation.          Sexually active: Yes.    The current method of family planning is condoms most of the time.     Patient has been counseled about results and procedure.  Risks and benefits have bene reviewed including immediate and/or delayed bleeding, infection, cervical scaring from procedure, possibility of needing additional follow up as well as treatment.  rare risks of missing a lesion discussed as well.  All questions answered.  Pt ready to proceed.  BP 104/52 mmHg  Pulse 58  Resp 16  Wt 146 lb 6.4 oz (66.407 kg)  Physical Exam  Constitutional: She is oriented to person, place, and time. She appears well-developed and well-nourished.  Abdominal: Soft. Bowel sounds are normal.  Genitourinary: There is no rash, tenderness, lesion or injury on the right labia. There is no rash, tenderness, lesion or injury on the left labia.    Neurological: She is alert and oriented to person, place, and time.  Psychiatric: She has a normal mood and affect.    Speculum placed.  3% acetic acid applied to cervix for >45 seconds.  Cervix visualized with both 7.5X and 15X magnification.  Green filter also used.  Lugols solution was used.  Findings:  AWE at 6 o'clock.  Biopsy:  At same location.  ECC:  was  not performed.  Monsel's was needed.  Excellent hemostasis was present.  Pt tolerated procedure well and all instruments were removed.  Findings noted above on picture of cervix.  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

## 2015-09-04 LAB — IPS OTHER TISSUE BIOPSY

## 2015-09-11 ENCOUNTER — Telehealth: Payer: Self-pay

## 2015-09-11 NOTE — Telephone Encounter (Signed)
Spoke with patient. Advised of message and results as seen below from Dr.Miller. She is agreeable and verbalizes understanding. Patient states that she would like to move forward with having a hysterectomy at this time. Advised I will let Dr.Miller and Orlean PattenSally Yeakler, RN know of desire to proceed with surgery. Advised she will be contacted regarding further recommendations and scheduling. She is agreeable.  Cc: Billie RuddySally Yeakley, RN

## 2015-09-11 NOTE — Telephone Encounter (Signed)
-----   Message from Jerene BearsMary S Miller, MD sent at 09/10/2015  9:24 AM EDT ----- Please let pt know pathology showed CIN 1 only.  Repeat pap and HR HPV 1 year.  Remove from any recall.  08 recall for next year needed.  Thanks.

## 2015-09-11 NOTE — Telephone Encounter (Signed)
Surgery would be laparoscopic TLH/bilateral salpingectomy.  Dx:  Dysmenorrhea, adenomyosis, persistent cervical dysplasia.  Thanks.  CC:  Billie RuddySally Yeakley, RN and Harland DingwallSuzy Dixon

## 2015-09-22 NOTE — Telephone Encounter (Signed)
Patient working with business office regarding surgery. Will let me know when ready to schedule. Encounter closed.

## 2015-09-29 ENCOUNTER — Telehealth: Payer: Self-pay | Admitting: Obstetrics & Gynecology

## 2015-09-29 NOTE — Telephone Encounter (Signed)
Encounter closed

## 2015-09-29 NOTE — Telephone Encounter (Signed)
Called patient to follow up with out patient surgery. Benefits were reviewed again. Patient advises she will call back when ready to schedule.  Routing to [employer]ally Yeakley

## 2015-09-29 NOTE — Telephone Encounter (Signed)
Patient is scheduled for annual exam 11-25-16 and 08 recall in entered. Ok to close encounter and wait for patient to call if desires to schedule surgery?

## 2015-09-29 NOTE — Telephone Encounter (Signed)
Yes, that is fine. Thanks. 

## 2015-10-13 DIAGNOSIS — J4520 Mild intermittent asthma, uncomplicated: Secondary | ICD-10-CM | POA: Insufficient documentation

## 2016-08-16 ENCOUNTER — Encounter: Payer: Self-pay | Admitting: Obstetrics & Gynecology

## 2016-08-16 ENCOUNTER — Other Ambulatory Visit (HOSPITAL_COMMUNITY)
Admission: RE | Admit: 2016-08-16 | Discharge: 2016-08-16 | Disposition: A | Discharge status: Home / Self Care | Payer: PRIVATE HEALTH INSURANCE | Source: Ambulatory Visit | Attending: Obstetrics & Gynecology | Admitting: Obstetrics & Gynecology

## 2016-08-16 ENCOUNTER — Ambulatory Visit (INDEPENDENT_AMBULATORY_CARE_PROVIDER_SITE_OTHER): Payer: PRIVATE HEALTH INSURANCE | Admitting: Obstetrics & Gynecology

## 2016-08-16 VITALS — BP 100/60 | HR 78 | Resp 14 | Ht 67.75 in | Wt 153.0 lb

## 2016-08-16 DIAGNOSIS — R300 Dysuria: Secondary | ICD-10-CM

## 2016-08-16 DIAGNOSIS — N870 Mild cervical dysplasia: Secondary | ICD-10-CM | POA: Insufficient documentation

## 2016-08-16 DIAGNOSIS — Z0000 Encounter for general adult medical examination without abnormal findings: Secondary | ICD-10-CM

## 2016-08-16 DIAGNOSIS — Z01419 Encounter for gynecological examination (general) (routine) without abnormal findings: Secondary | ICD-10-CM

## 2016-08-16 LAB — CBC
HEMATOCRIT: 39.4 % (ref 35.0–45.0)
HEMOGLOBIN: 13 g/dL (ref 11.7–15.5)
MCH: 28.9 pg (ref 27.0–33.0)
MCHC: 33 g/dL (ref 32.0–36.0)
MCV: 87.6 fL (ref 80.0–100.0)
MPV: 9.3 fL (ref 7.5–12.5)
Platelets: 237 10*3/uL (ref 140–400)
RBC: 4.5 MIL/uL (ref 3.80–5.10)
RDW: 12.9 % (ref 11.0–15.0)
WBC: 4.3 10*3/uL (ref 3.8–10.8)

## 2016-08-16 LAB — POCT URINALYSIS DIPSTICK
Bilirubin, UA: NEGATIVE
Blood, UA: NEGATIVE
Glucose, UA: NEGATIVE
KETONES UA: NEGATIVE
Nitrite, UA: NEGATIVE
PROTEIN UA: NEGATIVE
pH, UA: 7 (ref 5.0–8.0)

## 2016-08-16 LAB — TSH: TSH: 1.79 m[IU]/L

## 2016-08-16 MED ORDER — MONTELUKAST SODIUM 10 MG PO TABS: 10.0000 mg | ORAL_TABLET | Freq: Every day | ORAL | 4 refills | Status: DC

## 2016-08-16 NOTE — Progress Notes (Signed)
31 y.o. [id number] MarriedCaucasianF here for annual exam.  Sister, Mardella Layman, has been in [place] teaching English.  She will be home in a month.    Pain is the same as it has been in the past.  Doesn't actually cycle.  Does have pain when the cycle would be due.  Having recurrent UTI symptoms. Seeing [facility] tomorrow.  Thinks she has one today.  She has had a renal ultrasound.  This was done with [facility]--Dr. Pete Glatter.  Has had both positive and negative cultures.  No CT and no cystoscopy done yet.  Most recent culture was 06/21/16 with mixed flora.  No LMP recorded. Patient has had an ablation.          Sexually active: Yes.    The current method of family planning is vasectomy.    Exercising: Yes.    at home Smoker:  no  Health Maintenance: Pap:  08/14/15 CIN1. HR HPV: +Detected. Colpo 08/29/15 CIN1 only.  History of abnormal Pap:  Yes, CIN1 MMG:  07/07/09 Diagnostic Right. BIRADS2:Benign. f/u age 89. TDaP:  03/2011  Screening Labs: Here, Hb today: 12.3, Urine today: WBC=Mod    reports that she quit smoking about 3 years ago. Her smoking use included Cigarettes. She has never used smokeless tobacco. She reports that she does not drink alcohol or use drugs.  Past Medical History:  Diagnosis Date  . Asthma   . History of kidney infection during pregnancy   . No pertinent past medical history     Past Surgical History:  Procedure Laterality Date  . fatty tumor removed    . MANDIBLE FRACTURE SURGERY    . WISDOM TOOTH EXTRACTION  12 years ago    Current Outpatient Prescriptions  Medication Sig Dispense Refill  . albuterol (PROVENTIL HFA;VENTOLIN HFA) 108 (90 Base) MCG/ACT inhaler Inhale into the lungs.    Marland Kitchen albuterol (PROVENTIL) (2.5 MG/3ML) 0.083% nebulizer solution 2.5 mg.    . amphetamine-dextroamphetamine (ADDERALL) 5 MG tablet Take 5 mg by mouth daily.    Marland Kitchen lisdexamfetamine (VYVANSE) 30 MG capsule Take 30 mg by mouth daily.    . montelukast (SINGULAIR) 10 MG tablet  Take 10 mg by mouth.     No current facility-administered medications for this visit.     Family History  Problem Relation Age of Onset  . Hypertension Father   . Asthma Sister   . Diabetes Paternal Grandfather   . Diabetes Maternal Grandmother   . Breast cancer Maternal Grandmother   . Breast cancer Mother 84  . Lymphoma Paternal Grandmother        Non Hodgskins Lymphoma  . Anesthesia problems Neg Hx   . Hypotension Neg Hx   . Malignant hyperthermia Neg Hx   . Pseudochol deficiency Neg Hx     ROS:  Pertinent items are noted in HPI.  Otherwise, a comprehensive ROS was negative.  Exam:   BP 100/60 (BP Location: Right Arm, Patient Position: Sitting, Cuff Size: Normal)   Pulse 78   Resp 14   Ht 5' 7.75" (1.721 m)   Wt 153 lb (69.4 kg)   BMI 23.44 kg/m   Weight change: +4#   Height: 5' 7.75" (172.1 cm)  Ht Readings from Last 3 Encounters:  08/16/16 5' 7.75" (1.721 m)  08/14/15 5\' 8"  (1.727 m)  05/08/15 5\' 8"  (1.727 m)    General appearance: alert, cooperative and appears stated age Head: Normocephalic, without obvious abnormality, atraumatic Neck: no adenopathy, supple, symmetrical, trachea midline and thyroid  normal to inspection and palpation Lungs: clear to auscultation bilaterally Breasts: normal appearance, no masses or tenderness Heart: regular rate and rhythm Abdomen: soft, non-tender; bowel sounds normal; no masses,  no organomegaly Extremities: extremities normal, atraumatic, no cyanosis or edema Skin: Skin color, texture, turgor normal. No rashes or lesions Lymph nodes: Cervical, supraclavicular, and axillary nodes normal. No abnormal inguinal nodes palpated Neurologic: Grossly normal   Pelvic: External genitalia:  no lesions              Urethra:  normal appearing urethra with no masses, tenderness or lesions              Bartholins and Skenes: normal                 Vagina: normal appearing vagina with normal color and discharge, no lesions               Cervix: no lesions              Pap taken: Yes.   Bimanual Exam:  Uterus:  normal size, contour, position, consistency, mobility, non-tender              Adnexa: normal adnexa and no mass, fullness, tenderness               Rectovaginal: Confirms               Anus:  normal sphincter tone, no lesions  Chaperone was present for exam.  A:  Well Woman with normal exam H/O endometrial ablation 3/15 now with chronic cramping Strong family hx of breast cancer in mother/MGM/MGGM (MGM and mother with negative genetic testing Mild pelvic relaxation Recurrent UTIs vs IC (seeing urologist tomorrow) H/O LGSIL pap and CIN 1 Allergies/asthma Small protein [facility].  P:   Mammograms starting at age 31 pap smear and HR HPV obtained today Singulair 10mg  daily.  #90/4RF Seeing urologist tomorrow at [facility].  Urine culture pending today. TSH, CBC, CMP return annually or prn

## 2016-08-17 LAB — COMPREHENSIVE METABOLIC PANEL
ALBUMIN: 4.3 g/dL (ref 3.6–5.1)
ALT: 12 U/L (ref 6–29)
AST: 16 U/L (ref 10–30)
Alkaline Phosphatase: 35 U/L (ref 33–115)
BUN: 11 mg/dL (ref 7–25)
CALCIUM: 9.2 mg/dL (ref 8.6–10.2)
CHLORIDE: 105 mmol/L (ref 98–110)
CO2: 23 mmol/L (ref 20–31)
CREATININE: 0.68 mg/dL (ref 0.50–1.10)
Glucose, Bld: 76 mg/dL (ref 65–99)
Potassium: 3.9 mmol/L (ref 3.5–5.3)
SODIUM: 142 mmol/L (ref 135–146)
Total Bilirubin: 0.5 mg/dL (ref 0.2–1.2)
Total Protein: 6.8 g/dL (ref 6.1–8.1)

## 2016-08-17 LAB — CYTOLOGY - PAP: HPV (WINDOPATH): DETECTED — AB

## 2016-08-17 LAB — URINE CULTURE: Organism ID, Bacteria: NO GROWTH

## 2016-08-18 ENCOUNTER — Telehealth: Payer: Self-pay | Admitting: *Deleted

## 2016-08-18 ENCOUNTER — Telehealth: Payer: Self-pay | Admitting: Obstetrics & Gynecology

## 2016-08-18 DIAGNOSIS — R87612 Low grade squamous intraepithelial lesion on cytologic smear of cervix (LGSIL): Secondary | ICD-10-CM

## 2016-08-18 NOTE — Telephone Encounter (Signed)
Call to patient regarding pap results. Left message to call back.

## 2016-08-18 NOTE — Telephone Encounter (Signed)
Return call to Sally. °

## 2016-08-18 NOTE — Telephone Encounter (Signed)
Patient returned call. Reviewed benefit for recommended surgery. Patient understood and agreeable. Patient aware this is professional benefit only. Patient aware, once surgery has been scheduled, she will be contacted by hospital for separate benefits. Patient states she will confirm how she would like to proceed on Friday, 08/20/16.   cc: Billie RuddySally Yeakley

## 2016-08-18 NOTE — Telephone Encounter (Signed)
Called patient to review benefits for a recommended surgical procedure. Left Voicemail requesting a call back. ° ° cc: Sally Yeakley °

## 2016-08-18 NOTE — Telephone Encounter (Signed)
-----   Message from Mary S Miller, MD sent at 08/17/2016  9:01 PM EDT ----- Please let pt know her pap is abnormal.  Needs colposcopy.  She is likely going to need a leep as well with the high grade cell finding.  This pt is considering a hysterectomy and I sent you a prior message about it as well. 

## 2016-08-19 ENCOUNTER — Encounter: Payer: Self-pay | Admitting: Obstetrics & Gynecology

## 2016-08-19 NOTE — Telephone Encounter (Signed)
-----   Message from Jerene BearsMary S Miller, MD sent at 08/18/2016  1:15 PM EDT ----- Please inform pt urine culture was negative.  She had urology appt yesterday.  I'm very interested in an update from the appt.  Thanks.

## 2016-08-19 NOTE — Telephone Encounter (Signed)
-----   Message from Jerene BearsMary S Miller, MD sent at 08/17/2016  9:01 PM EDT ----- Please let pt know her pap is abnormal.  Needs colposcopy.  She is likely going to need a leep as well with the high grade cell finding.  This pt is considering a hysterectomy and I sent you a prior message about it as well.

## 2016-08-19 NOTE — Telephone Encounter (Signed)
Call from patient. Reviewed pap results and recommendation for colpo. Discussed potential for need for LEEP based on few higher grade cells noted on pap. Patient states she is familiar with colpo procedure and denies questions. Vasectomy for contraception and patient had had an ablation, LMP 4 years ago. Colpo scheduled for tomorrow at 10 am with Dr Hyacinth MeekerMiller.  Routing to provider for final review. Patient agreeable to disposition. Will close encounter.

## 2016-08-19 NOTE — Telephone Encounter (Signed)
Call to patient regarding My Chart message. Advised that colposcopy is needed to determine extent of cervical abnormality before proceeding with hysterectomy. Explained that results of colpo could have potential impact on type of surgical procedure recommended.  Patient agreeable to proceed with appointment tomorrow.  Routing to provider for final review. Patient agreeable to disposition. Will close encounter.

## 2016-08-19 NOTE — Telephone Encounter (Signed)
My Chart message from patient:  Dr Hyacinth MeekerMiller,  I was wondering do I have to have the colposcopy done if I am going to have the hysterectomy done? I am still going to have to pay $770.20 before the hysterectomy, on top of $366.40 for the colposcopy. I have paid the deposit to schedule the hysterectomy, just wondering if I know I am going to have it done do I really need to have the colposcopy done?

## 2016-08-19 NOTE — Telephone Encounter (Signed)
Patient notified about urine culture, states she is having a CT scan next week.   Dr. Gloris HamMiller FYI

## 2016-08-20 ENCOUNTER — Ambulatory Visit (INDEPENDENT_AMBULATORY_CARE_PROVIDER_SITE_OTHER): Payer: PRIVATE HEALTH INSURANCE | Admitting: Obstetrics & Gynecology

## 2016-08-20 ENCOUNTER — Encounter: Payer: Self-pay | Admitting: Obstetrics & Gynecology

## 2016-08-20 VITALS — BP 92/58 | HR 72 | Resp 16 | Ht 67.75 in | Wt 151.6 lb

## 2016-08-20 DIAGNOSIS — Z01812 Encounter for preprocedural laboratory examination: Secondary | ICD-10-CM

## 2016-08-20 DIAGNOSIS — R87612 Low grade squamous intraepithelial lesion on cytologic smear of cervix (LGSIL): Secondary | ICD-10-CM

## 2016-08-20 LAB — POCT URINE PREGNANCY: PREG TEST UR: NEGATIVE

## 2016-08-20 NOTE — Progress Notes (Signed)
31 y.o. 714P3 Divorced female here for colposcopy with possible biopsies and/or ECC due to LGISL pap with cells suspicious for HGSIL Pap obtained 08/16/16.    Prior evaluation/treatment:  colposcopy 08/29/15.  CIN 1 on biopsy            Colposcopy 11/15.  Biopsies with HPV changes  No LMP recorded. Patient has had an ablation.          Sexually active: Yes.    The current method of family planning is vasectomy.     Patient has been counseled about results and procedure.  Risks and benefits have bene reviewed including immediate and/or delayed bleeding, infection, cervical scaring from procedure, possibility of needing additional follow up as well as treatment.  rare risks of missing a lesion discussed as well.  All questions answered.  Pt ready to proceed.  BP (!) 92/58 (BP Location: Right Arm, Patient Position: Sitting, Cuff Size: Normal)   Pulse 72   Resp 16   Ht 5' 7.75" (1.721 m)   Wt 151 lb 9.6 oz (68.8 kg)   BMI 23.22 kg/m   Physical Exam  Constitutional: She is oriented to person, place, and time. She appears well-developed and well-nourished.  Genitourinary: Vagina normal. There is no rash, tenderness, lesion or injury on the right labia. There is no rash, tenderness, lesion or injury on the left labia.    Lymphadenopathy:       Right: No inguinal adenopathy present.       Left: No inguinal adenopathy present.  Neurological: She is alert and oriented to person, place, and time.  Skin: Skin is warm and dry.  Psychiatric: She has a normal mood and affect.    Speculum placed.  3% acetic acid applied to cervix for >45 seconds.  Cervix visualized with both 7.5X and 15X magnification.  Green filter also used.  Lugols solution was used.  Findings:  AWE with s at 6 and 8 o'clock.  Biopsy:  At both locations.  ECC:  was performed.  Monsel's was needed.  Excellent hemostasis was present.  Pt tolerated procedure well and all instruments were removed.  Findings noted above on picture of  cervix.  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  Findings on colposcopy consistent with high grade findings.  Plan:  Pathology results will be called to patient and follow-up planned pending results.

## 2016-08-23 ENCOUNTER — Encounter (HOSPITAL_BASED_OUTPATIENT_CLINIC_OR_DEPARTMENT_OTHER): Payer: Self-pay | Admitting: Emergency Medicine

## 2016-08-23 ENCOUNTER — Emergency Department (HOSPITAL_BASED_OUTPATIENT_CLINIC_OR_DEPARTMENT_OTHER)
Admission: EM | Admit: 2016-08-23 | Discharge: 2016-08-23 | Disposition: A | Discharge status: Home / Self Care | Payer: PRIVATE HEALTH INSURANCE | Attending: Emergency Medicine | Admitting: Emergency Medicine

## 2016-08-23 ENCOUNTER — Emergency Department (HOSPITAL_BASED_OUTPATIENT_CLINIC_OR_DEPARTMENT_OTHER): Payer: PRIVATE HEALTH INSURANCE

## 2016-08-23 DIAGNOSIS — Z87891 Personal history of nicotine dependence: Secondary | ICD-10-CM | POA: Diagnosis not present

## 2016-08-23 DIAGNOSIS — Y929 Unspecified place or not applicable: Secondary | ICD-10-CM | POA: Diagnosis not present

## 2016-08-23 DIAGNOSIS — J4520 Mild intermittent asthma, uncomplicated: Secondary | ICD-10-CM | POA: Insufficient documentation

## 2016-08-23 DIAGNOSIS — Z79899 Other long term (current) drug therapy: Secondary | ICD-10-CM | POA: Diagnosis not present

## 2016-08-23 DIAGNOSIS — Y939 Activity, unspecified: Secondary | ICD-10-CM | POA: Insufficient documentation

## 2016-08-23 DIAGNOSIS — Y999 Unspecified external cause status: Secondary | ICD-10-CM | POA: Insufficient documentation

## 2016-08-23 DIAGNOSIS — X509XXA Other and unspecified overexertion or strenuous movements or postures, initial encounter: Secondary | ICD-10-CM | POA: Insufficient documentation

## 2016-08-23 DIAGNOSIS — S93402A Sprain of unspecified ligament of left ankle, initial encounter: Secondary | ICD-10-CM | POA: Insufficient documentation

## 2016-08-23 DIAGNOSIS — S90912A Unspecified superficial injury of left ankle, initial encounter: Secondary | ICD-10-CM | POA: Diagnosis present

## 2016-08-23 NOTE — ED Triage Notes (Signed)
Patient reports that she was taking the cover off a pool yesterday and twisted her left ankle

## 2016-08-23 NOTE — Discharge Instructions (Signed)
Please follow up with Dr. Logan BoresEvans, podiatry, regarding today's visit in 1-2 weeks as needed. Please rest, ice, compression, elevation. Please use ibuprofen or naproxen as needed for pain and swelling. Use ASO brace as needed.   Get help right away if: Your toes or foot becomes numb or blue. You have severe pain that gets worse.

## 2016-08-23 NOTE — ED Provider Notes (Signed)
[facility] Provider Note   CSN: [id number] Arrival date & time: 08/23/16  0917     History   Chief Complaint Chief Complaint  Patient presents with  . Ankle Pain    HPI Kirsten Fischer is a 31 y.o. female.  The history is provided by the patient. No language interpreter was used.  Ankle Pain   The incident occurred yesterday. The incident occurred at the pool. The injury mechanism was a fall. The pain is present in the left ankle. The quality of the pain is described as sharp. The pain is at a severity of 7/10 (When standing on it or moving). The pain has been constant since onset. Pertinent negatives include no numbness, no inability to bear weight and no tingling. She reports no foreign bodies present. The symptoms are aggravated by activity and bearing weight. She has tried NSAIDs for the symptoms. The treatment provided no relief.    Past Medical History:  Diagnosis Date  . Asthma   . History of kidney infection during pregnancy   . No pertinent past medical history     Patient Active Problem List   Diagnosis Date Noted  . Mild intermittent asthma 10/13/2015  . ADHD (attention deficit hyperactivity disorder), combined type 06/18/2015  . Frequent UTI 06/11/2015  . S/P endometrial ablation 07/10/2013  . History of biliary T-tube placement 07/10/2013  . Menorrhagia 01/23/2013    Past Surgical History:  Procedure Laterality Date  . fatty tumor removed    . MANDIBLE FRACTURE SURGERY    . WISDOM TOOTH EXTRACTION  12 years ago    OB History    Gravida Para Term Preterm AB Living   4 3 3  0 1 3   SAB TAB Ectopic Multiple Live Births   1 0 0 0 3       Home Medications    Prior to Admission medications   Medication Sig Start Date End Date Taking? Authorizing Provider  albuterol (PROVENTIL HFA;VENTOLIN HFA) 108 (90 Base) MCG/ACT inhaler Inhale into the lungs. 05/21/15 08/16/16  [provider]  albuterol (PROVENTIL) (2.5 MG/3ML) 0.083%  nebulizer solution 2.5 mg. 04/28/16 04/28/17  [provider]  lisdexamfetamine (VYVANSE) 30 MG capsule Take 30 mg by mouth daily. 06/21/16   [provider]  montelukast (SINGULAIR) 10 MG tablet Take 1 tablet (10 mg total) by mouth at bedtime. 08/16/16   Jerene Bears, MD    Family History Family History  Problem Relation Age of Onset  . Hypertension Father   . Asthma Sister   . Diabetes Paternal Grandfather   . Diabetes Maternal Grandmother   . Breast cancer Maternal Grandmother   . Breast cancer Mother 68  . Lymphoma Paternal Grandmother        Non Hodgskins Lymphoma  . Anesthesia problems Neg Hx   . Hypotension Neg Hx   . Malignant hyperthermia Neg Hx   . Pseudochol deficiency Neg Hx     Social History Social History  Substance Use Topics  . Smoking status: Former Smoker    Types: Cigarettes    Quit date: 04/20/2013  . Smokeless tobacco: Never Used  . Alcohol use No     Allergies   Nitrofurantoin   Review of Systems Review of Systems  Constitutional: Negative for chills and fever.  Musculoskeletal: Positive for arthralgias.  Neurological: Negative for tingling and numbness.     Physical Exam Updated Vital Signs BP 124/89 (BP Location: Left Arm)   Pulse 86   Temp 98.8  F (37.1 C) (Oral)   Resp 18   Ht 5\' 8"  (1.727 m)   Wt 155 lb (70.3 kg)   SpO2 100%   BMI 23.57 kg/m   Physical Exam  Constitutional: She appears well-developed and well-nourished.  Well appearing  HENT:  Head: Normocephalic and atraumatic.  Nose: Nose normal.  Eyes: Conjunctivae and EOM are normal.  Neck: Normal range of motion.  Cardiovascular: Normal rate and intact distal pulses.   2+ distal pulses BLE  Pulmonary/Chest: Effort normal. No respiratory distress.  Normal work of breathing. No respiratory distress noted.   Abdominal: Soft.  Musculoskeletal: Normal range of motion.  Limited ROM to left ankle secondary to pain. No significant swelling. No redness or  deformity. Small amount of light bruising noted about 2 cm. Maximal TTP to ATFL.   Neurological: She is alert. No sensory deficit.  Good muscle strength against resistance to left plantar flexion and extension.   Skin: Skin is warm. Capillary refill takes less than 2 seconds.  Psychiatric: She has a normal mood and affect. Her behavior is normal.  Nursing note and vitals reviewed.    ED Treatments / Results  Labs (all labs ordered are listed, but only abnormal results are displayed) Labs Reviewed - No data to display  EKG  EKG Interpretation None       Radiology Dg Ankle Complete Left  Result Date: 08/23/2016 CLINICAL DATA:  Left lateral ankle pain and swelling. EXAM: LEFT ANKLE COMPLETE - 3+ VIEW COMPARISON:  None. FINDINGS: There is no evidence of fracture, dislocation, or joint effusion. There is no evidence of arthropathy or other focal bone abnormality. Soft tissues are unremarkable. IMPRESSION: No acute osseous injury of the left ankle. Electronically Signed   By: Elige KoHetal  Patel   On: 08/23/2016 09:42    Procedures Procedures (including critical care time)  Medications Ordered in ED Medications - No data to display   Initial Impression / Assessment and Plan / ED Course  I have reviewed the triage vital signs and the nursing notes.  Pertinent labs & imaging results that were available during my care of the patient were reviewed by me and considered in my medical decision making (see chart for details).    Patient X-Ray negative for obvious fracture or dislocation. Pain managed in ED. Pt advised to follow up with podiatry if symptoms persist for possibility of missed fracture diagnosis. Patient given brace while in ED, conservative therapy recommended and discussed. She declined crutches. Patient in NAD, hemodynamically stable, afebrile. Patient will be dc home & is agreeable with above plan. Return precautions given.   Final Clinical Impressions(s) / ED Diagnoses    Final diagnoses:  Sprain of left ankle, unspecified ligament, initial encounter    New Prescriptions New Prescriptions   No medications on file     Alvina Chouspina, Fredia Chittenden Manuel, [place] 08/23/16 1019    Linwood DibblesKnapp, Jon, MD 08/24/16 [phone]

## 2016-09-06 ENCOUNTER — Telehealth: Payer: Self-pay | Admitting: Obstetrics & Gynecology

## 2016-09-06 NOTE — Telephone Encounter (Signed)
Patient is ready to schedule her surgery. Patient said "I have been waiting for two weeks for a call to schedule my surgery".

## 2016-09-06 NOTE — Telephone Encounter (Signed)
Return call to patient. Per DPR can leave message on voice mail and voice mail has number confirmation. Left message advising patient that surgery is scheduled for Monday 10-04-16 at 1000 at [facility] as previously discussed with patient. Left message to call back at her convenience to schedule pre/post op appointmetns and review instructions.

## 2016-09-06 NOTE — Telephone Encounter (Signed)
Routing to Sally Yeakley, RN. 

## 2016-09-07 NOTE — Telephone Encounter (Signed)
Spoke with patient. Advised her surgery is scheduled for 10/04/2016 at [facility]. Surgical information form reviewed with patient. Pre op appointment scheduled for 09/23/2016 at 10 am with Dr.Miller. 1 week post op scheduled for 10/11/2016 at 10:30 am with Dr.Miller. 6 week post op scheduled for 11/18/2016 at 9 am with Dr.Miller. Surgical instruction form mailed to patient's home address on file.  Routing to Dr.Miller and Billie RuddySally Yeakley, RN as Lorain ChildesFYI.

## 2016-09-23 ENCOUNTER — Ambulatory Visit (INDEPENDENT_AMBULATORY_CARE_PROVIDER_SITE_OTHER): Payer: PRIVATE HEALTH INSURANCE | Admitting: Obstetrics & Gynecology

## 2016-09-23 ENCOUNTER — Telehealth: Payer: Self-pay | Admitting: *Deleted

## 2016-09-23 VITALS — BP 112/88 | HR 76 | Resp 14 | Ht 67.75 in | Wt 147.0 lb

## 2016-09-23 DIAGNOSIS — R102 Pelvic and perineal pain: Secondary | ICD-10-CM

## 2016-09-23 DIAGNOSIS — N870 Mild cervical dysplasia: Secondary | ICD-10-CM | POA: Diagnosis not present

## 2016-09-23 DIAGNOSIS — N3020 Other chronic cystitis without hematuria: Secondary | ICD-10-CM

## 2016-09-23 MED ORDER — OXYCODONE-ACETAMINOPHEN 5-325 MG PO TABS: 1.0000 | ORAL_TABLET | Freq: Four times a day (QID) | ORAL | 0 refills | Status: DC | PRN

## 2016-09-23 MED ORDER — IBUPROFEN 800 MG PO TABS: 800.0000 mg | ORAL_TABLET | Freq: Three times a day (TID) | ORAL | 0 refills | Status: DC | PRN

## 2016-09-23 NOTE — Progress Notes (Signed)
31 y.o. [id number] MarriedCaucasian female here for discussion of dysmenorrhea she has been experiencing since an endometrial ablation she underwent after the birth of her last child.  She's hurts for several days each month around the same time as she thinks her cycle would occur.  This is always preceeded by typical PMS symptoms for her.  She has been followed now for abnormal pap smears for the last three (+) years.  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    She has been contemplating options and we have discussed hysterectomy several times in the past.  Now that her colposcopic biopsies came back without high grade cells, she has decided she wants to proceed with more definitive treatment with hysterectomy.    She is accompanied by her mother today (who is also a patient of mine).  Different approaches for hysterectomy reviewed.  I feel total laparosopic hysterectomy vs vaginal hysterectomy are best approaches for this patient.  Procedures discussed with patient.  Hospital stay, recovery and pain management all discussed.  Risks discussed including but not limited to bleeding, 1% risk of receiving a  transfusion, infection, 3-4% risk of bowel/bladder/ureteral/vascular injury discussed as well as possible need for additional surgery if injury does occur discussed.  DVT/PE and rare risk of death discussed.  My actual complications with prior surgeries discussed.  Vaginal cuff dehiscence discussed.  Hernia formation discussed.  Positioning and incision locations discussed.  Patient aware if pathology abnormal she may need additional treatment.  All questions answered.  Patient states she wants me to do what I think is best.  She did recently see urology due to "recurrent UTIs' vs possible IC.  CT was ordered but doing this at [facility] was out of network for her so she cancelled it.  Advised this can be done elsewhere and she is interested in pursuing this.    Ob Hx:   No LMP  recorded. Patient has had an ablation.          Sexually active: Yes.   Birth control: vasectomy Last pap: 08/16/16 LSIL, HR HPV:+detected. Colpo: CIN1 Last MMG: 07/07/09 Diagnostic Right BIRADS2:Benign. F/u age 31. Tobacco: No  Past Surgical History:  Procedure Laterality Date  . fatty tumor removed    . MANDIBLE FRACTURE SURGERY    . WISDOM TOOTH EXTRACTION  12 years ago    Past Medical History:  Diagnosis Date  . Asthma   . History of kidney infection during pregnancy   . No pertinent past medical history     Allergies: Nitrofurantoin  Current Outpatient Prescriptions  Medication Sig Dispense Refill  . albuterol (PROVENTIL HFA;VENTOLIN HFA) 108 (90 Base) MCG/ACT inhaler Inhale into the lungs.    Marland Kitchen. albuterol (PROVENTIL) (2.5 MG/3ML) 0.083% nebulizer solution 2.5 mg.    . amphetamine-dextroamphetamine (ADDERALL) 5 MG tablet Take 1 tablet by mouth daily.  0  . lisdexamfetamine (VYVANSE) 30 MG capsule Take 30 mg by mouth daily.    . montelukast (SINGULAIR) 10 MG tablet Take 1 tablet (10 mg total) by mouth at bedtime. 90 tablet 4   No current facility-administered medications for this visit.     ROS: A comprehensive review of systems was negative.  Exam:    BP 112/88 (BP Location: Right Arm, Patient Position: Sitting, Cuff Size: Normal)   Pulse 76   Resp 14   Ht 5' 7.75" (1.721 m)   Wt 147 lb (66.7 kg)   BMI 22.52 kg/m   General appearance:  alert and cooperative Head: Normocephalic, without obvious abnormality, atraumatic Neck: no adenopathy, supple, symmetrical, trachea midline and thyroid not enlarged, symmetric, no tenderness/mass/nodules Lungs: clear to auscultation bilaterally Heart: regular rate and rhythm, S1, S2 normal, no murmur, click, rub or gallop Abdomen: soft, non-tender; bowel sounds normal; no masses,  no organomegaly, small umbilical hernia Extremities: extremities normal, atraumatic, no cyanosis or edema Skin: Skin color, texture, turgor normal. No  rashes or lesions Lymph nodes: Cervical, supraclavicular, and axillary nodes normal. no inguinal nodes palpated Neurologic: Grossly normal  Pelvic: External genitalia:  no lesions              Urethra: normal appearing urethra with no masses, tenderness or lesions              Bartholins and Skenes: normal                 Vagina: normal appearing vagina with normal color and discharge, no lesions              Cervix: normal appearance              Pap taken: No.        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  Pt states if possible to repair hernia, she would desire this CT of abdomen and pelvic is ordered. Rx for Motrin and Percocet given. Medications/Vitamins reviewed.  Pt knows needs to take no ASA products before surgery. Hysterectomy brochure given for pre and post op instructions.  ~25 minutes spent with patient >50% of time was in face to face discussion of above.

## 2016-09-23 NOTE — Telephone Encounter (Signed)
Call to [facility] per Dr. Rondel BatonMiller's request. F/u on recommended CT imaging ordered by Dr. Berneice HeinrichManny. Patient scheduled for hysterectomy on 10/04/16 with Dr. Hyacinth MeekerMiller. CT not covered by patients insurance plan at recommended imaging location, review alternative for scheduling CT prior to surgery. Spoke with Vernona RiegerLaura, was forwarded to Radiology scheduler, Rosey Batheresa. Left message to call Noreene LarssonJill at [phone].

## 2016-09-23 NOTE — Telephone Encounter (Signed)
Spoke with Rosey Batheresa at [facility]. Was advised CT not scheduled in office d/t being out-of-network, was pre-authorized. If patient prefers imaging at different location, Dr. Hyacinth MeekerMiller would have to order. Was advised Dr. Berneice HeinrichManny ordered CT Abdomen pelvis without contrast for Dx: chronic cystitis without hematuria. Advised will review with Dr. Hyacinth MeekerMiller and proceed with [facility].    Call to patient, advised can [facility]. Our insurance and benefits department will have to pre-authorize and [facility] will call to schedule, patient is agreeable.   Reviewed with Dr. Hyacinth MeekerMiller, ok to proceed with CT Abdomen pelvis without contrast. Dx: Dx: chronic cystitis without hematuria.   Order placed.   Cc: Harland DingwallSuzy Dixon

## 2016-09-24 ENCOUNTER — Other Ambulatory Visit: Payer: Self-pay | Admitting: *Deleted

## 2016-09-24 ENCOUNTER — Encounter: Payer: Self-pay | Admitting: Obstetrics & Gynecology

## 2016-09-24 ENCOUNTER — Other Ambulatory Visit: Payer: Self-pay | Admitting: Obstetrics & Gynecology

## 2016-09-24 ENCOUNTER — Telehealth: Payer: Self-pay | Admitting: *Deleted

## 2016-09-24 DIAGNOSIS — N3020 Other chronic cystitis without hematuria: Secondary | ICD-10-CM

## 2016-09-24 NOTE — Telephone Encounter (Signed)
Spoke with patient. Advised per review with [facility] they will need records from [facility] for review for PA of CT scan. Patient will call to request records be sent to our office from [facility].

## 2016-09-24 NOTE — Telephone Encounter (Signed)
Call placed to patient regarding abdominal /pelvis CT scan, scheduled with [facility] on 09/28/16.  Advised patient pre-certification has been initiated, but have not obtained an authorization, as of yet. Explained to patient, if scan is not authorized prior to services being rendered, it may not be covered. I advised patient as soon as we receive notification from her insurance company, we will advise her. Patient understood information   Routng to Dr Hyacinth MeekerMiller  cc: Nolen MuKaitlyn Sprague, RN  cc: Carmelina DaneJill Hamm, RN

## 2016-09-24 NOTE — Telephone Encounter (Signed)
Spoke with Erskine SquibbJane at [facility] in reference to scheduled CT of abdomen pelvis without contrast. [facility] recommending CT of abdomen and pelvis WITH contrast will also allow to evaluate for kidney stones. Advised will review with Dr. Hyacinth MeekerMiller and return call.   Reviewed with Dr. Hyacinth MeekerMiller, agreeable to CT abdomen pelvis WITH contrast.  Returned call to Erskine SquibbJane at [facility], advised new order placed. Will update Suzy in insurance/benefits department.  Routing to provider for final review. Patient is agreeable to disposition. Will close encounter.   Cc: Harland DingwallSuzy Dixon

## 2016-09-24 NOTE — Telephone Encounter (Signed)
Spoke with patient. Patient states that she spoke with [facility] and records will be faxed to our office today. States [facility] also sent notes on 09/17/2016. Advised once I have obtained results these will be faxed to her insurance company for review. Patient is agreeable.

## 2016-09-24 NOTE — Telephone Encounter (Signed)
Call to nurse Barb at [phone] ext [phone] to assist with clinical review for CT abdomen pelvis. Left a message to return call to [facility].

## 2016-09-24 NOTE — Telephone Encounter (Signed)
Records from [facility] came over to our office by fax and I hand-delivered them to Kaitlyn/Jill.

## 2016-09-24 NOTE — Telephone Encounter (Signed)
[facility] records received and [facility] for review of CT PA fax [phone]. Patient has been notified that notes have been sent to insurance for review.

## 2016-09-24 NOTE — Telephone Encounter (Signed)
Patient called back and requested Kirsten Fischer return her call. She declined to give any other details.

## 2016-09-26 ENCOUNTER — Encounter: Payer: Self-pay | Admitting: Obstetrics & Gynecology

## 2016-09-26 DIAGNOSIS — R102 Pelvic and perineal pain: Secondary | ICD-10-CM | POA: Insufficient documentation

## 2016-09-26 DIAGNOSIS — N870 Mild cervical dysplasia: Secondary | ICD-10-CM | POA: Insufficient documentation

## 2016-09-28 ENCOUNTER — Other Ambulatory Visit: Payer: Self-pay | Admitting: Obstetrics & Gynecology

## 2016-09-28 ENCOUNTER — Ambulatory Visit
Admission: RE | Admit: 2016-09-28 | Discharge: 2016-09-28 | Disposition: A | Discharge status: Home / Self Care | Payer: No Typology Code available for payment source | Source: Ambulatory Visit | Attending: Obstetrics & Gynecology | Admitting: Obstetrics & Gynecology

## 2016-09-28 DIAGNOSIS — N3020 Other chronic cystitis without hematuria: Secondary | ICD-10-CM

## 2016-09-28 MED ORDER — IOPAMIDOL (ISOVUE-300) INJECTION 61%
100.0000 mL | Freq: Once | INTRAVENOUS | Status: AC | PRN
  Administered 2016-09-28: 100 mL via INTRAVENOUS

## 2016-09-28 NOTE — Telephone Encounter (Signed)
CT has been authorized. Patient is scheduled for CT scan 09/28/2016 at 12 pm. Patient is aware of appointment date time, and authorization.  Routing to provider for final review. Patient agreeable to disposition. Will close encounter.

## 2016-09-30 ENCOUNTER — Encounter (HOSPITAL_COMMUNITY): Payer: Self-pay

## 2016-09-30 ENCOUNTER — Encounter (HOSPITAL_COMMUNITY)
Admission: RE | Admit: 2016-09-30 | Discharge: 2016-09-30 | Disposition: A | Discharge status: Home / Self Care | Payer: PRIVATE HEALTH INSURANCE | Source: Ambulatory Visit | Attending: Obstetrics & Gynecology | Admitting: Obstetrics & Gynecology

## 2016-09-30 DIAGNOSIS — Z01812 Encounter for preprocedural laboratory examination: Secondary | ICD-10-CM | POA: Diagnosis present

## 2016-09-30 DIAGNOSIS — N946 Dysmenorrhea, unspecified: Secondary | ICD-10-CM | POA: Diagnosis not present

## 2016-09-30 DIAGNOSIS — N870 Mild cervical dysplasia: Secondary | ICD-10-CM | POA: Diagnosis not present

## 2016-09-30 HISTORY — DX: Other specified postprocedural states: Z98.890

## 2016-09-30 HISTORY — DX: Other complications of anesthesia, initial encounter: T88.59XA

## 2016-09-30 HISTORY — DX: Adverse effect of unspecified anesthetic, initial encounter: T41.45XA

## 2016-09-30 HISTORY — DX: Other specified postprocedural states: R11.2

## 2016-09-30 HISTORY — DX: Gastro-esophageal reflux disease without esophagitis: K21.9

## 2016-09-30 LAB — CBC
HCT: 37.4 % (ref 36.0–46.0)
Hemoglobin: 13.1 g/dL (ref 12.0–15.0)
MCH: 28.7 pg (ref 26.0–34.0)
MCHC: 35 g/dL (ref 30.0–36.0)
MCV: 82 fL (ref 78.0–100.0)
PLATELETS: 214 10*3/uL (ref 150–400)
RBC: 4.56 MIL/uL (ref 3.87–5.11)
RDW: 11.9 % (ref 11.5–15.5)
WBC: 5.3 10*3/uL (ref 4.0–10.5)

## 2016-09-30 LAB — HCG, SERUM, QUALITATIVE: PREG SERUM: NEGATIVE

## 2016-09-30 NOTE — Patient Instructions (Signed)
Kirsten Fischer  09/30/2016      Your procedure is scheduled on   10/04/2016    [facility]  At   0830  A.M.  Call this number if you have problems the morning of surgery:[phone]             OUR ADDRESS IS [address] , WE ARE LOCATED IN THE              [facility].    Remember:  Do not eat food or drink liquids after midnight.  Take these medicines the morning of surgery with A SIP OF WATER  Do not wear jewelry, make-up or nail polish.  Do not wear lotions, powders, or perfumes, or deoderant.  Do not shave 48 hours prior to surgery.  Men may shave face and neck.  Do not bring valuables to the hospital.  [facility] is not responsible for any belongings or valuables.  Contacts, dentures or bridgework may not be worn into surgery.  Leave your suitcase in the car.  After surgery it may be brought to your room.  For patients admitted to the hospital, discharge time will be determined by your treatment team.   Special instructions:   Please read over the following fact sheets that you were given.    [place] - Preparing for Surgery Before surgery, you can play an important role.  Because skin is not sterile, your skin needs to be as free of germs as possible.  You can reduce the number of germs on your skin by washing with CHG (chlorahexidine gluconate) soap before surgery.  CHG is an antiseptic cleaner which kills germs and bonds with the skin to continue killing germs even after washing. Please DO NOT use if you have an allergy to CHG or antibacterial soaps.  If your skin becomes reddened/irritated stop using the CHG and inform your nurse when you arrive at Short Stay. Do not shave (including legs and underarms) for at least 48 hours prior to the first CHG shower.  You may shave your face/neck. Please follow these instructions carefully:  1.  Shower with CHG Soap the night before surgery and the  morning of Surgery.  2.  If you  choose to wash your hair, wash your hair first as usual with your  normal  shampoo.  3.  After you shampoo, rinse your hair and body thoroughly to remove the  shampoo.                           4.  Use CHG as you would any other liquid soap.  You can apply chg directly  to the skin and wash                       Gently with a scrungie or clean washcloth.  5.  Apply the CHG Soap to your body ONLY FROM THE NECK DOWN.   Do not use on face/ open                           Wound or open sores. Avoid contact with eyes, ears mouth and genitals (private parts).                       Wash face,  Genitals (private parts) with your normal soap.  6.  Wash thoroughly, paying special attention to the area where your surgery  will be performed.  7.  Thoroughly rinse your body with warm water from the neck down.  8.  DO NOT shower/wash with your normal soap after using and rinsing off  the CHG Soap.                9.  Pat yourself dry with a clean towel.            10.  Wear clean pajamas.            11.  Place clean sheets on your bed the night of your first shower and do not  sleep with pets. Day of Surgery : Do not apply any lotions/deodorants the morning of surgery.  Please wear clean clothes to the hospital/surgery center.  FAILURE TO FOLLOW THESE INSTRUCTIONS MAY RESULT IN THE CANCELLATION OF YOUR SURGERY PATIENT SIGNATURE_________________________________  NURSE SIGNATURE__________________________________  ________________________________________________________________________

## 2016-10-04 ENCOUNTER — Encounter (HOSPITAL_BASED_OUTPATIENT_CLINIC_OR_DEPARTMENT_OTHER): Payer: Self-pay | Admitting: *Deleted

## 2016-10-04 ENCOUNTER — Encounter (HOSPITAL_COMMUNITY)
Admission: RE | Disposition: A | Discharge status: Home / Self Care | Payer: Self-pay | Source: Ambulatory Visit | Attending: Obstetrics & Gynecology

## 2016-10-04 ENCOUNTER — Ambulatory Visit (HOSPITAL_BASED_OUTPATIENT_CLINIC_OR_DEPARTMENT_OTHER): Payer: PRIVATE HEALTH INSURANCE | Admitting: Anesthesiology

## 2016-10-04 ENCOUNTER — Ambulatory Visit (HOSPITAL_BASED_OUTPATIENT_CLINIC_OR_DEPARTMENT_OTHER)
Admission: RE | Admit: 2016-10-04 | Discharge: 2016-10-06 | Disposition: A | Discharge status: Home / Self Care | Payer: PRIVATE HEALTH INSURANCE | Source: Ambulatory Visit | Attending: Obstetrics & Gynecology | Admitting: Obstetrics & Gynecology

## 2016-10-04 DIAGNOSIS — N871 Moderate cervical dysplasia: Secondary | ICD-10-CM | POA: Insufficient documentation

## 2016-10-04 DIAGNOSIS — N99841 Postprocedural hematoma of a genitourinary system organ or structure following other procedure: Secondary | ICD-10-CM | POA: Insufficient documentation

## 2016-10-04 DIAGNOSIS — N808 Other endometriosis: Secondary | ICD-10-CM | POA: Insufficient documentation

## 2016-10-04 DIAGNOSIS — N801 Endometriosis of ovary: Secondary | ICD-10-CM | POA: Diagnosis not present

## 2016-10-04 DIAGNOSIS — N809 Endometriosis, unspecified: Secondary | ICD-10-CM | POA: Diagnosis present

## 2016-10-04 DIAGNOSIS — R87619 Unspecified abnormal cytological findings in specimens from cervix uteri: Secondary | ICD-10-CM | POA: Diagnosis not present

## 2016-10-04 DIAGNOSIS — N8311 Corpus luteum cyst of right ovary: Secondary | ICD-10-CM | POA: Diagnosis not present

## 2016-10-04 DIAGNOSIS — N8312 Corpus luteum cyst of left ovary: Secondary | ICD-10-CM | POA: Insufficient documentation

## 2016-10-04 DIAGNOSIS — N838 Other noninflammatory disorders of ovary, fallopian tube and broad ligament: Secondary | ICD-10-CM | POA: Insufficient documentation

## 2016-10-04 DIAGNOSIS — N805 Endometriosis of intestine: Secondary | ICD-10-CM | POA: Insufficient documentation

## 2016-10-04 DIAGNOSIS — J45909 Unspecified asthma, uncomplicated: Secondary | ICD-10-CM | POA: Insufficient documentation

## 2016-10-04 DIAGNOSIS — N946 Dysmenorrhea, unspecified: Secondary | ICD-10-CM | POA: Insufficient documentation

## 2016-10-04 DIAGNOSIS — N939 Abnormal uterine and vaginal bleeding, unspecified: Secondary | ICD-10-CM | POA: Diagnosis not present

## 2016-10-04 DIAGNOSIS — D649 Anemia, unspecified: Secondary | ICD-10-CM | POA: Insufficient documentation

## 2016-10-04 DIAGNOSIS — N3010 Interstitial cystitis (chronic) without hematuria: Secondary | ICD-10-CM | POA: Diagnosis not present

## 2016-10-04 DIAGNOSIS — Z87891 Personal history of nicotine dependence: Secondary | ICD-10-CM | POA: Insufficient documentation

## 2016-10-04 DIAGNOSIS — N803 Endometriosis of pelvic peritoneum: Secondary | ICD-10-CM | POA: Diagnosis not present

## 2016-10-04 DIAGNOSIS — Z79899 Other long term (current) drug therapy: Secondary | ICD-10-CM | POA: Insufficient documentation

## 2016-10-04 DIAGNOSIS — R102 Pelvic and perineal pain: Secondary | ICD-10-CM | POA: Diagnosis present

## 2016-10-04 DIAGNOSIS — R109 Unspecified abdominal pain: Secondary | ICD-10-CM

## 2016-10-04 HISTORY — PX: TOTAL LAPAROSCOPIC HYSTERECTOMY WITH SALPINGECTOMY: SHX6742

## 2016-10-04 HISTORY — PX: CYSTOSCOPY: SHX5120

## 2016-10-04 SURGERY — HYSTERECTOMY, TOTAL, LAPAROSCOPIC, WITH SALPINGECTOMY
Anesthesia: General

## 2016-10-04 MED ORDER — ONDANSETRON HCL 4 MG/2ML IJ SOLN
INTRAMUSCULAR | Status: AC
  Filled 2016-10-04: qty 2

## 2016-10-04 MED ORDER — DEXAMETHASONE SODIUM PHOSPHATE 4 MG/ML IJ SOLN
INTRAMUSCULAR | Status: DC | PRN
  Administered 2016-10-04: 10 mg via INTRAVENOUS

## 2016-10-04 MED ORDER — LACTATED RINGERS IV SOLN
INTRAVENOUS | Status: DC
  Administered 2016-10-04 (×2): via INTRAVENOUS
  Filled 2016-10-04: qty 1000

## 2016-10-04 MED ORDER — SCOPOLAMINE 1 MG/3DAYS TD PT72
1.0000 | MEDICATED_PATCH | TRANSDERMAL | Status: DC
  Administered 2016-10-04: 1.5 mg via TRANSDERMAL
  Administered 2016-10-04: 1 via TRANSDERMAL
  Filled 2016-10-04: qty 1

## 2016-10-04 MED ORDER — SODIUM CHLORIDE 0.9 % IV SOLN
INTRAVENOUS | Status: DC | PRN
  Administered 2016-10-04: 60 mL

## 2016-10-04 MED ORDER — ROCURONIUM BROMIDE 10 MG/ML (PF) SYRINGE
PREFILLED_SYRINGE | INTRAVENOUS | Status: DC | PRN
  Administered 2016-10-04: 10 mg via INTRAVENOUS
  Administered 2016-10-04: 40 mg via INTRAVENOUS

## 2016-10-04 MED ORDER — SCOPOLAMINE 1 MG/3DAYS TD PT72
MEDICATED_PATCH | TRANSDERMAL | Status: AC
  Filled 2016-10-04: qty 1

## 2016-10-04 MED ORDER — ALUM & MAG HYDROXIDE-SIMETH 200-200-20 MG/5ML PO SUSP: 30.0000 mL | ORAL | Status: DC | PRN

## 2016-10-04 MED ORDER — MENTHOL 3 MG MT LOZG: 1.0000 | LOZENGE | OROMUCOSAL | Status: DC | PRN

## 2016-10-04 MED ORDER — KETOROLAC TROMETHAMINE 30 MG/ML IJ SOLN: 30.0000 mg | Freq: Four times a day (QID) | INTRAMUSCULAR | Status: DC

## 2016-10-04 MED ORDER — PANTOPRAZOLE SODIUM 40 MG IV SOLR
40.0000 mg | Freq: Every day | INTRAVENOUS | Status: DC
  Administered 2016-10-04: 40 mg via INTRAVENOUS
  Filled 2016-10-04: qty 40

## 2016-10-04 MED ORDER — PROPOFOL 10 MG/ML IV BOLUS
INTRAVENOUS | Status: DC | PRN
  Administered 2016-10-04: 170 mg via INTRAVENOUS

## 2016-10-04 MED ORDER — DEXAMETHASONE SODIUM PHOSPHATE 10 MG/ML IJ SOLN
INTRAMUSCULAR | Status: AC
  Filled 2016-10-04: qty 1

## 2016-10-04 MED ORDER — KETOROLAC TROMETHAMINE 30 MG/ML IJ SOLN
30.0000 mg | Freq: Four times a day (QID) | INTRAMUSCULAR | Status: DC
  Administered 2016-10-04 – 2016-10-06 (×7): 30 mg via INTRAVENOUS
  Filled 2016-10-04 (×7): qty 1

## 2016-10-04 MED ORDER — FENTANYL CITRATE (PF) 250 MCG/5ML IJ SOLN
INTRAMUSCULAR | Status: AC
  Filled 2016-10-04: qty 5

## 2016-10-04 MED ORDER — ACETAMINOPHEN 10 MG/ML IV SOLN
INTRAVENOUS | Status: DC | PRN
  Administered 2016-10-04: 1000 mg via INTRAVENOUS

## 2016-10-04 MED ORDER — MIDAZOLAM HCL 5 MG/5ML IJ SOLN
INTRAMUSCULAR | Status: DC | PRN
  Administered 2016-10-04: 2 mg via INTRAVENOUS

## 2016-10-04 MED ORDER — OXYCODONE HCL 5 MG PO TABS
5.0000 mg | ORAL_TABLET | Freq: Once | ORAL | Status: DC | PRN
  Filled 2016-10-04: qty 1

## 2016-10-04 MED ORDER — BUPIVACAINE HCL (PF) 0.25 % IJ SOLN
INTRAMUSCULAR | Status: DC | PRN
  Administered 2016-10-04: 14 mL

## 2016-10-04 MED ORDER — ONDANSETRON HCL 4 MG/2ML IJ SOLN
INTRAMUSCULAR | Status: DC | PRN
  Administered 2016-10-04: 4 mg via INTRAVENOUS

## 2016-10-04 MED ORDER — SUGAMMADEX SODIUM 200 MG/2ML IV SOLN
INTRAVENOUS | Status: DC | PRN
  Administered 2016-10-04: 125 mg via INTRAVENOUS

## 2016-10-04 MED ORDER — CEFOTETAN DISODIUM-DEXTROSE 2-2.08 GM-% IV SOLR
INTRAVENOUS | Status: AC
  Filled 2016-10-04: qty 50

## 2016-10-04 MED ORDER — ACETAMINOPHEN 10 MG/ML IV SOLN
INTRAVENOUS | Status: AC
  Filled 2016-10-04: qty 100

## 2016-10-04 MED ORDER — DEXTROSE-NACL 5-0.45 % IV SOLN
INTRAVENOUS | Status: DC
  Administered 2016-10-04 – 2016-10-05 (×3): via INTRAVENOUS
  Administered 2016-10-05: 125 mL/h via INTRAVENOUS
  Administered 2016-10-06: 12:00:00 via INTRAVENOUS

## 2016-10-04 MED ORDER — LIDOCAINE 2% (20 MG/ML) 5 ML SYRINGE
INTRAMUSCULAR | Status: AC
Start: 2016-10-04 — End: 2016-10-04
  Filled 2016-10-04: qty 5

## 2016-10-04 MED ORDER — KETOROLAC TROMETHAMINE 30 MG/ML IJ SOLN
INTRAMUSCULAR | Status: DC | PRN
  Administered 2016-10-04: 30 mg via INTRAVENOUS

## 2016-10-04 MED ORDER — DEXTROSE 5 % IV SOLN
2.0000 g | Freq: Once | INTRAVENOUS | Status: AC
  Administered 2016-10-04: 2 g via INTRAVENOUS
  Filled 2016-10-04: qty 2

## 2016-10-04 MED ORDER — FENTANYL CITRATE (PF) 100 MCG/2ML IJ SOLN
25.0000 ug | INTRAMUSCULAR | Status: DC | PRN
  Administered 2016-10-04 (×4): 50 ug via INTRAVENOUS
  Filled 2016-10-04: qty 1

## 2016-10-04 MED ORDER — LIDOCAINE 2% (20 MG/ML) 5 ML SYRINGE
INTRAMUSCULAR | Status: DC | PRN
  Administered 2016-10-04: 50 mg via INTRAVENOUS

## 2016-10-04 MED ORDER — MORPHINE SULFATE (PF) 10 MG/ML IV SOLN
1.0000 mg | INTRAVENOUS | Status: DC | PRN
  Administered 2016-10-04 – 2016-10-06 (×5): 2 mg via INTRAVENOUS
  Filled 2016-10-04 (×5): qty 1

## 2016-10-04 MED ORDER — OXYCODONE HCL 5 MG/5ML PO SOLN
5.0000 mg | Freq: Once | ORAL | Status: DC | PRN
  Filled 2016-10-04: qty 5

## 2016-10-04 MED ORDER — MIDAZOLAM HCL 2 MG/2ML IJ SOLN
INTRAMUSCULAR | Status: AC
  Filled 2016-10-04: qty 2

## 2016-10-04 MED ORDER — SIMETHICONE 80 MG PO CHEW
80.0000 mg | CHEWABLE_TABLET | Freq: Four times a day (QID) | ORAL | Status: DC | PRN
  Administered 2016-10-05: 80 mg via ORAL
  Filled 2016-10-04: qty 1

## 2016-10-04 MED ORDER — FENTANYL CITRATE (PF) 100 MCG/2ML IJ SOLN
INTRAMUSCULAR | Status: AC
  Filled 2016-10-04: qty 2

## 2016-10-04 MED ORDER — ACETAMINOPHEN 325 MG PO TABS: 650.0000 mg | ORAL_TABLET | ORAL | Status: DC | PRN

## 2016-10-04 MED ORDER — OXYCODONE-ACETAMINOPHEN 5-325 MG PO TABS
1.0000 | ORAL_TABLET | ORAL | Status: DC | PRN
  Administered 2016-10-04 (×2): 1 via ORAL
  Administered 2016-10-05: 2 via ORAL
  Administered 2016-10-05 – 2016-10-06 (×4): 1 via ORAL
  Filled 2016-10-04 (×6): qty 1
  Filled 2016-10-04: qty 2

## 2016-10-04 MED ORDER — SUGAMMADEX SODIUM 200 MG/2ML IV SOLN
INTRAVENOUS | Status: AC
  Filled 2016-10-04: qty 2

## 2016-10-04 MED ORDER — FENTANYL CITRATE (PF) 100 MCG/2ML IJ SOLN
INTRAMUSCULAR | Status: DC | PRN
  Administered 2016-10-04 (×3): 50 ug via INTRAVENOUS
  Administered 2016-10-04: 100 ug via INTRAVENOUS

## 2016-10-04 SURGICAL SUPPLY — 54 items
AIRSEAL ×3 IMPLANT
APPLICATOR ARISTA FLEXITIP XL (MISCELLANEOUS) IMPLANT
CABLE HIGH FREQUENCY MONO STRZ (ELECTRODE) ×3 IMPLANT
CLOTH BEACON ORANGE TIMEOUT ST (SAFETY) ×3 IMPLANT
COVER LIGHT HANDLE  1/PK (MISCELLANEOUS) ×2
COVER LIGHT HANDLE 1/PK (MISCELLANEOUS) ×1 IMPLANT
COVER MAYO STAND STRL (DRAPES) ×3 IMPLANT
DERMABOND ADVANCED (GAUZE/BANDAGES/DRESSINGS) ×10
DERMABOND ADVANCED .7 DNX12 (GAUZE/BANDAGES/DRESSINGS) ×5 IMPLANT
DURAPREP 26ML APPLICATOR (WOUND CARE) ×3 IMPLANT
GLOVE BIOGEL PI IND STRL 7.0 (GLOVE) ×5 IMPLANT
GLOVE BIOGEL PI INDICATOR 7.0 (GLOVE) ×10
GLOVE ECLIPSE 6.5 STRL STRAW (GLOVE) ×9 IMPLANT
GOWN STRL REUS W/TWL LRG LVL3 (GOWN DISPOSABLE) ×12 IMPLANT
HEMOSTAT ARISTA ABSORB 3G PWDR (MISCELLANEOUS) IMPLANT
LIGASURE VESSEL 5MM BLUNT TIP (ELECTROSURGICAL) ×3 IMPLANT
NEEDLE INSUFFLATION 120MM (ENDOMECHANICALS) ×3 IMPLANT
NS IRRIG 1000ML POUR BTL (IV SOLUTION) ×3 IMPLANT
OCCLUDER COLPOPNEUMO (BALLOONS) ×3 IMPLANT
PACK LAPAROSCOPY BASIN (CUSTOM PROCEDURE TRAY) ×3 IMPLANT
PACK TRENDGUARD 450 HYBRID PRO (MISCELLANEOUS) ×1 IMPLANT
PACK TRENDGUARD 600 HYBRD PROC (MISCELLANEOUS) IMPLANT
POUCH LAPAROSCOPIC INSTRUMENT (MISCELLANEOUS) ×3 IMPLANT
PROTECTOR NERVE ULNAR (MISCELLANEOUS) ×6 IMPLANT
SCISSORS LAP 5X35 DISP (ENDOMECHANICALS) ×3 IMPLANT
SET CYSTO W/LG BORE CLAMP LF (SET/KITS/TRAYS/PACK) IMPLANT
SET IRRIG TUBING LAPAROSCOPIC (IRRIGATION / IRRIGATOR) ×3 IMPLANT
SET IRRIG Y TYPE TUR BLADDER L (SET/KITS/TRAYS/PACK) ×3 IMPLANT
SET TRI-LUMEN FLTR TB AIRSEAL (TUBING) ×6 IMPLANT
SHEARS HARMONIC ACE PLUS 36CM (ENDOMECHANICALS) ×3 IMPLANT
SOLUTION ELECTROLUBE (MISCELLANEOUS) ×3 IMPLANT
SUT VIC AB 0 CT1 27 (SUTURE) ×4
SUT VIC AB 0 CT1 27XBRD ANBCTR (SUTURE) ×2 IMPLANT
SUT VIC AB 4-0 PS2 18 (SUTURE) ×3 IMPLANT
SUT VICRYL 0 UR6 27IN ABS (SUTURE) ×3 IMPLANT
SUT VICRYL 4-0 PS2 18IN ABS (SUTURE) ×3 IMPLANT
SUT VLOC 180 0 9IN  GS21 (SUTURE) ×2
SUT VLOC 180 0 9IN GS21 (SUTURE) ×1 IMPLANT
SYR 50ML LL SCALE MARK (SYRINGE) ×6 IMPLANT
SYRINGE 10CC LL (SYRINGE) ×3 IMPLANT
SYSTEM CARTER THOMASON II (TROCAR) IMPLANT
TIP UTERINE 5.1X6CM LAV DISP (MISCELLANEOUS) IMPLANT
TIP UTERINE 6.7X10CM GRN DISP (MISCELLANEOUS) IMPLANT
TIP UTERINE 6.7X6CM WHT DISP (MISCELLANEOUS) IMPLANT
TIP UTERINE 6.7X8CM BLUE DISP (MISCELLANEOUS) ×3 IMPLANT
TOWEL OR 17X24 6PK STRL BLUE (TOWEL DISPOSABLE) ×6 IMPLANT
TRENDGUARD 450 HYBRID PRO PACK (MISCELLANEOUS) ×3
TRENDGUARD 600 HYBRID PROC PK (MISCELLANEOUS)
TROCAR ADV FIXATION 5X100MM (TROCAR) ×3 IMPLANT
TROCAR BLADELESS OPT 5 100 (ENDOMECHANICALS) ×6 IMPLANT
TROCAR PORT AIRSEAL 5X120 (TROCAR) ×3 IMPLANT
TROCAR XCEL NON BLADE 8MM B8LT (ENDOMECHANICALS) ×3 IMPLANT
TROCAR XCEL NON-BLD 5MMX100MML (ENDOMECHANICALS) ×3 IMPLANT
WARMER LAPAROSCOPE (MISCELLANEOUS) ×3 IMPLANT

## 2016-10-04 NOTE — H&P (Signed)
Kirsten Fischer is an 31 y.o. female G4P3 MWF here for hysterectomy due to persistent pelvic pain that started after an office endometrial ablation and seems to occur during the same time as when her cycle would occur.  The pain lasts several days and is preceded by PMS symptoms just like her cycle used to be.  She and I have discussed this over the last three years and she is ready for definitive surgery.  As well, she has a low grade persistent abnormal Pap smear during the time I've know her.  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  She is also desirous of definitive treatment to hopefully see resolution of her abnormal pap smears.  Risks, benefits, and alternatives have all been discussed with her.  She is here and ready to proceed.  Pertinent Gynecological History: Menses: none Bleeding: none Contraception: vasectomy DES exposure: denies Blood transfusions: none Sexually transmitted diseases: no past history Previous GYN Procedures: colposcopies for abnormal pap smear  Last mammogram: n/a Date: n/a Last pap: abnormal: LGSIL Date: 08/16/16.  Colposcopy was done 08/20/16 with CIN1 only noted OB History: G4, P3   Menstrual History: No LMP recorded. Patient has had an ablation.    Past Medical History:  Diagnosis Date  . Asthma   . Complication of anesthesia   . GERD (gastroesophageal reflux disease)   . History of kidney infection during pregnancy   . PONV (postoperative nausea and vomiting)     Past Surgical History:  Procedure Laterality Date  . ENDOMETRIAL ABLATION    . fatty tumor removed    . MANDIBLE FRACTURE SURGERY    . WISDOM TOOTH EXTRACTION  12 years ago    Family History  Problem Relation Age of Onset  . Hypertension Father   . Asthma Sister   . Diabetes Paternal Grandfather   . Diabetes Maternal Grandmother   . Breast cancer Maternal Grandmother   . Breast cancer Mother 11  . Lymphoma Paternal Grandmother    Non Hodgskins Lymphoma  . Anesthesia problems Neg Hx   . Hypotension Neg Hx   . Malignant hyperthermia Neg Hx   . Pseudochol deficiency Neg Hx     Social History:  reports that she quit smoking about 3 years ago. Her smoking use included Cigarettes. She has never used smokeless tobacco. She reports that she drinks alcohol. She reports that she does not use drugs.  Allergies:  Allergies  Allergen Reactions  . Nitrofurantoin Hives    Prescriptions Prior to Admission  Medication Sig Dispense Refill Last Dose  . acetaminophen (TYLENOL) 325 MG tablet Take 650 mg by mouth every 6 (six) hours as needed.   Past Month at Unknown time  . amphetamine-dextroamphetamine (ADDERALL) 5 MG tablet Take 1 tablet by mouth daily.  0 10/01/2016 at Unknown time  . ibuprofen (ADVIL,MOTRIN) 800 MG tablet Take 1 tablet (800 mg total) by mouth every 8 (eight) hours as needed. 30 tablet 0 Past Month at Unknown time  . lisdexamfetamine (VYVANSE) 30 MG capsule Take 30 mg by mouth daily.   10/02/2016 at Unknown time  . montelukast (SINGULAIR) 10 MG tablet Take 1 tablet (10 mg total) by mouth at bedtime. 90 tablet 4 10/03/2016 at Unknown time  . albuterol (PROVENTIL) (2.5 MG/3ML) 0.083% nebulizer solution 2.5 mg.   More than a month at Unknown time  . oxyCODONE-acetaminophen (PERCOCET) 5-325 MG tablet Take 1-2 tablets by mouth every 6 (six) hours as needed.  use only as much as needed to relieve pain 30 tablet 0 never    Review of Systems  All other systems reviewed and are negative.   Blood pressure 110/72, pulse 71, temperature 98.2 F (36.8 C), temperature source Oral, resp. rate 16, height 5\' 8"  (1.727 m), weight 141 lb (64 kg), SpO2 100 %. Physical Exam  Constitutional: She is oriented to person, place, and time. She appears well-developed and well-nourished.  Cardiovascular: Normal rate and regular rhythm.   Respiratory: Effort normal and breath sounds normal.  Neurological: She is alert and oriented to person,  place, and time.  Skin: Skin is warm and dry.  Psychiatric: She has a normal mood and affect.    No results found for this or any previous visit (from the past 24 hour(s)).  No results found.  Assessment/Plan: 31 yo G4P3 MWF with chronic pelvic pain that started after an office endometrial ablation 3/15 as well as a persistently abnormal low grade pap smear.  She is here for definitive treatment with hysterectomy and bilateral salpingectomy.  All questions answered.  Pt here and ready to proceed.   Valentina ShaggyMILLER, Kirsten Fischer 10/04/2016, 11:06 AM

## 2016-10-04 NOTE — Anesthesia Procedure Notes (Signed)
Procedure Name: Intubation Date/Time: 10/04/2016 11:39 AM Performed by: Wanita Chamberlain Pre-anesthesia Checklist: Patient identified, Timeout performed, Emergency Drugs available, Suction available and Patient being monitored Patient Re-evaluated:Patient Re-evaluated prior to inductionOxygen Delivery Method: Circle system utilized Preoxygenation: Pre-oxygenation with 100% oxygen Intubation Type: IV induction Ventilation: Mask ventilation without difficulty and Oral airway inserted - appropriate to patient size Laryngoscope Size: Mac and 3 Grade View: Grade I Tube type: Oral Tube size: 7.0 mm Airway Equipment and Method: Oral airway Placement Confirmation: ETT inserted through vocal cords under direct vision and positive ETCO2 Secured at: 22 cm Tube secured with: Tape Dental Injury: Teeth and Oropharynx as per pre-operative assessment

## 2016-10-04 NOTE — Progress Notes (Signed)
Day of Surgery Procedure(s) (LRB): HYSTERECTOMY TOTAL LAPAROSCOPIC WITH SALPINOOPHORECTOMY (N/A) CYSTOSCOPY (N/A)  Subjective: Patient reports she is having some pain.  Just took a Microbiologistpercocet.  No nausea.  No emesis.  Has voided.  Drinking liquids without problems. Vitals:   10/04/16 1849 10/04/16 1943  BP: 105/65 119/74  Pulse: (!) 54 92  Resp: 16 16  Temp: 98.3 F (36.8 C) 98.6 F (37 C)     Objective: I have reviewed patient's vital signs, intake and output, medications and labs.  General: alert and cooperative Resp: clear to auscultation bilaterally Cardio: regular rate and rhythm, S1, S2 normal, no murmur, click, rub or gallop GI: soft, non-tender; bowel sounds normal; no masses,  no organomegaly and incision: clean, dry and intact Extremities: extremities normal, atraumatic, no cyanosis or edema Vaginal Bleeding: none  Assessment: s/p Procedure(s): HYSTERECTOMY TOTAL LAPAROSCOPIC WITH SALPINOOPHORECTOMY (N/A) CYSTOSCOPY (N/A): stable  Plan: Advance diet Encourage ambulation continue to work on pain control  CBC and BMP in am Consider D/C in am  LOS: 0 days    Valentina ShaggyMILLER, Nike Southwell SUZANNE 10/04/2016, 8:21 PM

## 2016-10-04 NOTE — Op Note (Addendum)
10/04/2016  3:09 PM  PATIENT:  Kirsten Fischer  31 y.o. 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   PROCEDURE:  Procedure(s): HYSTERECTOMY TOTAL LAPAROSCOPIC WITH SALPINOOPHORECTOMY CYSTOSCOPY  SURGEON:  Emmalyne Giacomo SUZANNE  ASSISTANTS: Conley Simmonds, MD   ANESTHESIA:   general  ESTIMATED BLOOD LOSS:  50cc  BLOOD ADMINISTERED:none   FLUIDS: 1300ccLR  UOP: 200cc clear UOP  SPECIMEN:  Bilateral fallopian tubes, ovaries, cervix and uterus, peritoneal biopsy  DISPOSITION OF SPECIMEN:  PATHOLOGY  FINDINGS: Stage 4 endometriosis with multiple endometriosis implants above liver and adhesions from anterior abdominal wall to anterior surface of liver, endometriosis on tip of appendix, four deep endometriosis implants on anterior peritoneum above uterus, bilateral endometriomas with the left endometrioma being at least 2cm deep by 3cm wide, right ovary adhered with dense adhesions to the posterior side of the uterus and the cervix.  DESCRIPTION OF OPERATION: Patient is taken to the operating room. She is placed in the supine position. She is a running IV in place. Informed consent was present on the chart. SCDs on her lower extremities and functioning properly. General endotracheal anesthesia was administered by the anesthesia staff without difficulty. Once adequate anesthesia was confirmed the legs are placed in the low lithotomy position in Iron Belt stirrups. Her arms were tucked by the side.   Chlora prep was then used to prep the abdomen and Betadine was used to prep the inner thighs, perineum and vagina. Once 3 minutes had past the patient was draped in a normal standard fashion. The legs were lifted to the high lithotomy position. The cervix was visualized by placing a heavy weighted speculum in the  posterior aspect of the vagina and using a curved Deaver retractor to the retract anteriorly. The anterior lip of the cervix was grasped with single-tooth tenaculum.  There was scarring in the endocervical canal due to the prior endometrial ablation.  This was popped through easily with [facility] dilators.    The cervix sounded to 8 cm. Pratt dilators were used to dilate the cervix up to a #21. A RUMI uterine manipulator was obtained. A #8 disposable tip was placed on the RUMI manipulator as well as a 3.5, silver KOH ring. This was passed through the cervix and the bulb of the disposable tip was inflated with 2 cc of normal saline.  No more saline would go into the bulb due to the prior ablation.  There was a good fit of the KOH ring around the cervix. The tenaculum was removed. There is also good manipulation of the uterus. The speculum and retractor were removed as well. A Foley catheter was placed to straight drain. Clear urine was noted. Legs were lowered to the low lithotomy position and attention was turned the abdomen.  There was a small umbilical hernia present.  Therefore a left upper quadrant entry was used.  The skin was anesthetized 2cm beneath the costal margin in the mid clavicular line.  Skin was incised with #11 blade.  Using a 5mm optiview port with laparoscope present, this was passed into the abdominal cavity without difficulty.  Then low flow CO2 gas was attached the needle and the pneumoperitoneum was achieved without difficulty. Above findings with significant endometriosis noted.  Locations for RLQ, LLQ and suprapubic ports were noted by transillumination of the abdominal wall.  0.25% marcaine was used to anesthetize the skin.  8mm skin incision was made for port in the RLQ.  5mm AirSeal port was placed.   8mm skin incision was made suprapubic and an 8mm non bladed trochar and port were placed under direct visualization.  Then a 5mm skin incision was made in the LLQ and then a 5mm bladed ports  were placed.  Decision was made to also place a 5mm incision in the umbilicus for visualization as there was such significant endometriosis.  Skin was anesthetized and skin incision made with #11 blade.  5mm non bladed trochar and port were placed.  Trochars were all removed.     Ureters were identifies.  Attention was turned to the left side.  Due to endometriomas and adhesions as well as significance of endometriosis, decision was made to proceed with BSO.  With uterus on stretch the left IP ligament was serially clamped, cauterized and incised.  Left round ligament was serially clamped cauterized and incised. The anterior and posterior peritoneum of the inferior leaf of the broad ligament were opened. The beginning of the baldder flap was created.  Tissue was thickened along the dissection plane for the bladder flap but no obviously endometriosis was present at this location.  The bladder was taken down below the level of the KOH ring. The left uterine artery skeletonized and then just superior to the KOH ring this vessel was serially clamped, cauterized, and incised.  Attention was turned the right side.  The uterus was placed on stretch to the opposite side.  The right IP ligament was serially clamped, cauterized and incised.  Next the right round ligament was serially clamped cauterized and incised. The anterior posterior peritoneum of the inferiorly for the broad ligament were opened. The anterior peritoneum was carried across to the dissection on the left side. The remainder of the bladder flap was created using sharp dissection. The bladder was well below the level of the KOH ring. The right uterine artery skeletonized. Then the right uterine artery, above the level of the KOH ring, was serially clamped cauterized and incised. The uterus was devascularized at this point.  The colpotomy was performed a starting in the midline and using a harmonic scalpel with the inferior edge of the open blade  This  was carried around a circumferential fashion until the vaginal mucosa was completely incised in the specimen was freed.  On the posterior side of the colpotomy, the dissection was above the uterosacral ligament but beneath the ovarian scarring so the ovary was completely removed with the specimen.  The specimen was then delivered to the vagina.  A vaginal occlusive device was used to maintain the pneumoperitoneum  Instruments were changed with a needle driver and Kobra graspers.  Using a 9 inch V. lock suture, the cuff was closed by incorporating the anterior and posterior vaginal mucosa in each stitch. This was carried across all the way to the left corner and a running fashion. Two stitches were brought back towards the midline and the suture was cut flush with the vagina. The needle was brought out the pelvis. The pelvis was irrigated. All pedicles were inspected. No bleeding was noted.   Due to the more difficult surgery, cystoscopy was performed at this point.  Foley was removed.  Bladder was intact without any evidence of injury or sutures.  Hypervascular findings c/w IC were noted.  Photos were taken.  Normal urine jets were note from each ureteral orifice.  The cystoscopic fluid was drained and the foley left out.  Pelvic was re visualized.  Peritoneal biopsy was obtained with an area of deep endometriosis implants present.  Irrigation was performed.  No bleeding was noted.  Arista was placed along all pedicle surfaces.  At this point the procedure was completed. The LLQ, LUQ and suprapubic ports were removed.  The pneumoperitoneum was relived and the final two ports removed.  The patient was taken out of Trendelenburg positioning.    The skin was then closed with subcuticular stitches of 3-0 Vicryl. The skin was cleansed Dermabond was applied. Attention was then turned the vagina and the cuff was inspected. No bleeding was noted. The anterior posterior vaginal mucosa was incorporated in each  stitch.  Sponge, lap, needle, initially counts were correct x2. Patient tolerated the procedure very well. She was awakened from anesthesia, extubated and taken to recovery in stable condition.    COUNTS:  YES  PLAN OF CARE: Transfer to PACU

## 2016-10-04 NOTE — Transfer of Care (Signed)
Immediate Anesthesia Transfer of Care Note  Patient: Karen ChafeLauren Robertson  Procedure(s) Performed: Procedure(s): HYSTERECTOMY TOTAL LAPAROSCOPIC WITH SALPINOOPHORECTOMY (N/A) CYSTOSCOPY (N/A)  Patient Location: PACU  Anesthesia Type:General  Level of Consciousness: awake, alert , oriented and patient cooperative  Airway & Oxygen Therapy: Patient Spontanous Breathing and Patient connected to nasal cannula oxygen  Post-op Assessment: Report given to RN and Post -op Vital signs reviewed and stable  Post vital signs: Reviewed and stable  Last Vitals:  Vitals:   10/04/16 0821  BP: 110/72  Pulse: 71  Resp: 16  Temp: 36.8 C    Last Pain:  Vitals:   10/04/16 0821  TempSrc: Oral      Patients Stated Pain Goal: 8 (10/04/16 0848)  Complications: No apparent anesthesia complications

## 2016-10-04 NOTE — Anesthesia Preprocedure Evaluation (Addendum)
Anesthesia Evaluation  Patient identified by MRN, date of birth, ID band Patient awake    Reviewed: Allergy & Precautions, NPO status , Patient's Chart, lab work & pertinent test results  History of Anesthesia Complications (+) PONV and history of anesthetic complications  Airway Mallampati: II  TM Distance: >3 FB Neck ROM: Full    Dental  (+) Teeth Intact, Dental Advisory Given,    Pulmonary neg shortness of breath, asthma , neg sleep apnea, neg recent URI, former smoker,    breath sounds clear to auscultation       Cardiovascular negative cardio ROS   Rhythm:Regular     Neuro/Psych negative neurological ROS     GI/Hepatic negative GI ROS, Neg liver ROS, GERD  ,  Endo/Other  negative endocrine ROS  Renal/GU negative Renal ROS     Musculoskeletal   Abdominal   Peds  Hematology   Anesthesia Other Findings   Reproductive/Obstetrics                           Anesthesia Physical Anesthesia Plan  ASA: II  Anesthesia Plan: General   Post-op Pain Management:    Induction: Intravenous  PONV Risk Score and Plan: 4 or greater and Ondansetron, Dexamethasone, Propofol, Midazolam and Scopolamine patch - Pre-op  Airway Management Planned: Oral ETT  Additional Equipment: None  Intra-op Plan:   Post-operative Plan: Extubation in OR  Informed Consent: I have reviewed the patients History and Physical, chart, labs and discussed the procedure including the risks, benefits and alternatives for the proposed anesthesia with the patient or authorized representative who has indicated his/her understanding and acceptance.   Dental advisory given  Plan Discussed with: CRNA and Surgeon  Anesthesia Plan Comments:         Anesthesia Quick Evaluation

## 2016-10-05 ENCOUNTER — Ambulatory Visit (HOSPITAL_COMMUNITY): Payer: PRIVATE HEALTH INSURANCE

## 2016-10-05 ENCOUNTER — Encounter (HOSPITAL_BASED_OUTPATIENT_CLINIC_OR_DEPARTMENT_OTHER): Payer: Self-pay | Admitting: Obstetrics & Gynecology

## 2016-10-05 DIAGNOSIS — N946 Dysmenorrhea, unspecified: Secondary | ICD-10-CM | POA: Diagnosis not present

## 2016-10-05 LAB — CBC
HCT: 27 % — ABNORMAL LOW (ref 36.0–46.0)
HEMOGLOBIN: 9.4 g/dL — AB (ref 12.0–15.0)
MCH: 28.5 pg (ref 26.0–34.0)
MCHC: 34.8 g/dL (ref 30.0–36.0)
MCV: 81.8 fL (ref 78.0–100.0)
Platelets: 195 10*3/uL (ref 150–400)
RBC: 3.3 MIL/uL — AB (ref 3.87–5.11)
RDW: 12 % (ref 11.5–15.5)
WBC: 9.7 10*3/uL (ref 4.0–10.5)

## 2016-10-05 LAB — BASIC METABOLIC PANEL
ANION GAP: 4 — AB (ref 5–15)
BUN: 9 mg/dL (ref 6–20)
CALCIUM: 8 mg/dL — AB (ref 8.9–10.3)
CHLORIDE: 103 mmol/L (ref 101–111)
CO2: 27 mmol/L (ref 22–32)
CREATININE: 0.55 mg/dL (ref 0.44–1.00)
GFR calc non Af Amer: >60 mL/min (ref >60)
GLUCOSE: 165 mg/dL — AB (ref 65–99)
Potassium: 3.6 mmol/L (ref 3.5–5.1)
Sodium: 134 mmol/L — ABNORMAL LOW (ref 135–145)

## 2016-10-05 LAB — HEMOGLOBIN AND HEMATOCRIT, BLOOD
HCT: 25 % — ABNORMAL LOW (ref 36.0–46.0)
HEMOGLOBIN: 8.9 g/dL — AB (ref 12.0–15.0)

## 2016-10-05 MED ORDER — IOPAMIDOL (ISOVUE-300) INJECTION 61%
INTRAVENOUS | Status: AC
  Filled 2016-10-05: qty 30

## 2016-10-05 MED ORDER — IOPAMIDOL (ISOVUE-300) INJECTION 61%
INTRAVENOUS | Status: AC
  Filled 2016-10-05: qty 100

## 2016-10-05 MED ORDER — HYDROMORPHONE HCL 2 MG PO TABS
2.0000 mg | ORAL_TABLET | ORAL | Status: DC | PRN
  Administered 2016-10-05 (×2): 2 mg via ORAL
  Filled 2016-10-05 (×2): qty 1

## 2016-10-05 MED ORDER — IOPAMIDOL (ISOVUE-300) INJECTION 61%
100.0000 mL | Freq: Once | INTRAVENOUS | Status: AC | PRN
  Administered 2016-10-05: 100 mL via INTRAVENOUS

## 2016-10-05 MED ORDER — PANTOPRAZOLE SODIUM 40 MG PO TBEC
40.0000 mg | DELAYED_RELEASE_TABLET | Freq: Every day | ORAL | Status: DC
  Administered 2016-10-05: 40 mg via ORAL
  Filled 2016-10-05: qty 1

## 2016-10-05 MED ORDER — IOPAMIDOL (ISOVUE-300) INJECTION 61%
15.0000 mL | Freq: Once | INTRAVENOUS | Status: DC | PRN
  Administered 2016-10-05: 15 mL via ORAL
  Filled 2016-10-05: qty 30

## 2016-10-05 NOTE — Anesthesia Postprocedure Evaluation (Signed)
Anesthesia Post Note  Patient: Kirsten ChafeLauren Robertson  Procedure(s) Performed: Procedure(s) (LRB): HYSTERECTOMY TOTAL LAPAROSCOPIC WITH SALPINOOPHORECTOMY (N/A) CYSTOSCOPY (N/A)     Patient location during evaluation: PACU Anesthesia Type: General Level of consciousness: awake and alert Pain management: pain level controlled Vital Signs Assessment: post-procedure vital signs reviewed and stable Respiratory status: spontaneous breathing, nonlabored ventilation, respiratory function stable and patient connected to nasal cannula oxygen Cardiovascular status: blood pressure returned to baseline and stable Postop Assessment: no signs of nausea or vomiting Anesthetic complications: no    Last Vitals:  Vitals:   10/05/16 0223 10/05/16 0507  BP: (!) 98/57 (!) 101/48  Pulse: 71 (!) 55  Resp: 14 16  Temp: 37.1 C 37.1 C    Last Pain:  Vitals:   10/05/16 0550  TempSrc:   PainSc: Asleep                 Bryon Parker

## 2016-10-05 NOTE — Progress Notes (Signed)
1 Day Post-Op Procedure(s) (LRB): HYSTERECTOMY TOTAL LAPAROSCOPIC WITH SALPINOOPHORECTOMY (N/A) CYSTOSCOPY (N/A)  Subjective: Patient reports upper abdominal pain.  Hasn't really gotten good pain control through the night.  Walking.  Feels better when she is upright and walking.  Feels a little short of breath with a lot of talking.  No nausea.  Lower abdomen is just with cramping.  Had liquids without difficulty last night.  Voiding well.  Objective: I have reviewed patient's vital signs, intake and output, medications and labs.  General: alert and cooperative Resp: clear to auscultation bilaterally Cardio: regular rate and rhythm, S1, S2 normal, no murmur, click, rub or gallop GI: soft, non-tender; bowel sounds normal; no masses,  no organomegaly and incision: clean, dry and intact Extremities: extremities normal, atraumatic, no cyanosis or edema Vaginal Bleeding: none  Assessment: s/p Procedure(s): HYSTERECTOMY TOTAL LAPAROSCOPIC WITH SALPINOOPHORECTOMY (N/A) CYSTOSCOPY (N/A): Stable but with upper abdominal pain and inadequate pain control  Plan: Repeat H & H today at noon.    Change pain medication. Will consider imaging if pain control is not improved.  LOS: 0 days    Valentina ShaggyMILLER, Donis Pinder [facility] LPUZANNE 10/05/2016, 7:29 AM

## 2016-10-05 NOTE — Progress Notes (Signed)
PHARMACIST - PHYSICIAN COMMUNICATION CONCERNING: IV to Oral Route Change Policy  RECOMMENDATION: This patient is receiving Protonix by the intravenous route.  Based on criteria approved by the Pharmacy and Therapeutics Committee, the intravenous medication(s) is/are being converted to the equivalent oral dose form(s).   DESCRIPTION: These criteria include:  The patient is eating (either orally or via tube) and/or has been taking other orally administered medications for a least 24 hours  The patient has no evidence of active gastrointestinal bleeding or impaired GI absorption (gastrectomy, short bowel, patient on TNA or NPO).  If you have questions about this conversion, please contact the Pharmacy Department  []   [phone]( [phone] )  [facility] []   [phone]  [facility] []   [phone]( [phone] )  [facility] []   [phone]( [phone]  [facility] [x]   [phone]( [phone] )  [facility]   Clance BollRunyon, Navpreet Szczygiel, [facility] IncRPH 10/05/2016 12:59 PM

## 2016-10-05 NOTE — Progress Notes (Signed)
1 Day Post-Op Procedure(s) (LRB): HYSTERECTOMY TOTAL LAPAROSCOPIC WITH BILATERAL SALPINGO-OOPHORECTOMY AND CYSTOSCOPY PERITONEAL BIOPSY (N/A) CYSTOSCOPY (N/A)  Subjective: Patient reports pain has improved but still present.  Passing flatus (and this helped the pain).  Eating without problems.  No nausea.  Voiding well.  Walking and feels best when she is walking.  Voiding without difficulty.  Reviewed CT results with pt.  10cm likely pelvic hematoma noted.  This is consistent with post op hb of 9.4 this morning which was lower than expected with EBL from yesterday.  Repeat at noon today was 8.9.  Feel need to monitor to ensure stable before discharge.  Pt comfortable with plan.  Objective: I have reviewed patient's vital signs, intake and output, medications and labs.  General: alert and cooperative Resp: clear to auscultation bilaterally Cardio: regular rate and rhythm, S1, S2 normal, no murmur, click, rub or gallop GI: soft, non-tender; bowel sounds normal; no masses,  no organomegaly and incision: clean, dry and intact Extremities: extremities normal, atraumatic, no cyanosis or edema Vaginal Bleeding: none  Assessment: s/p Procedure(s): HYSTERECTOMY TOTAL LAPAROSCOPIC WITH BILATERAL SALPINGO-OOPHORECTOMY AND CYSTOSCOPY PERITONEAL BIOPSY (N/A) CYSTOSCOPY (N/A): stable and post op (likely) pelvic hematoma on CT scan  Plan: Decrease fluids  Continue SCDs Repeat CBC in AM  LOS: 0 days    Valentina ShaggyMILLER, Braxxton Stoudt SUZANNE 10/05/2016, 8:22 PM

## 2016-10-06 DIAGNOSIS — N946 Dysmenorrhea, unspecified: Secondary | ICD-10-CM | POA: Diagnosis not present

## 2016-10-06 LAB — HEMOGLOBIN AND HEMATOCRIT, BLOOD
HEMATOCRIT: 24.7 % — AB (ref 36.0–46.0)
Hemoglobin: 8.4 g/dL — ABNORMAL LOW (ref 12.0–15.0)

## 2016-10-06 LAB — CBC
HEMATOCRIT: 22.3 % — AB (ref 36.0–46.0)
HEMOGLOBIN: 7.8 g/dL — AB (ref 12.0–15.0)
MCH: 29.8 pg (ref 26.0–34.0)
MCHC: 35 g/dL (ref 30.0–36.0)
MCV: 85.1 fL (ref 78.0–100.0)
Platelets: 143 10*3/uL — ABNORMAL LOW (ref 150–400)
RBC: 2.62 MIL/uL — AB (ref 3.87–5.11)
RDW: 12.6 % (ref 11.5–15.5)
WBC: 5.5 10*3/uL (ref 4.0–10.5)

## 2016-10-06 MED ORDER — MONTELUKAST SODIUM 10 MG PO TABS: 10.0000 mg | ORAL_TABLET | Freq: Every day | ORAL | Status: DC

## 2016-10-06 MED ORDER — DOCUSATE SODIUM 100 MG PO CAPS: 100.0000 mg | ORAL_CAPSULE | Freq: Two times a day (BID) | ORAL | 0 refills | Status: DC

## 2016-10-06 MED ORDER — ALUM & MAG HYDROXIDE-SIMETH 200-200-20 MG/5ML PO SUSP: 30.0000 mL | ORAL | 0 refills | Status: DC | PRN

## 2016-10-06 MED ORDER — IBUPROFEN 800 MG PO TABS
800.0000 mg | ORAL_TABLET | Freq: Three times a day (TID) | ORAL | Status: DC
  Administered 2016-10-06 (×2): 800 mg via ORAL
  Filled 2016-10-06 (×2): qty 1

## 2016-10-06 MED ORDER — DOCUSATE SODIUM 100 MG PO CAPS
100.0000 mg | ORAL_CAPSULE | Freq: Two times a day (BID) | ORAL | Status: DC
  Administered 2016-10-06: 100 mg via ORAL
  Filled 2016-10-06: qty 1

## 2016-10-06 MED ORDER — NORETHINDRONE ACETATE 5 MG PO TABS: 5.0000 mg | ORAL_TABLET | Freq: Every day | ORAL | 0 refills | Status: DC

## 2016-10-06 NOTE — Progress Notes (Signed)
Reviewed AVS and discharge summary with pt and boyfriend. Questions/concerns answered to pt satisfaction. Pt discharged in stable condition w/belongings via WC home w/boyfriend.

## 2016-10-06 NOTE — Progress Notes (Addendum)
2 Days Post-Op Procedure(s) (LRB): HYSTERECTOMY TOTAL LAPAROSCOPIC WITH BILATERAL SALPINGO-OOPHORECTOMY AND CYSTOSCOPY PERITONEAL BIOPSY (N/A) CYSTOSCOPY (N/A)  Subjective: Patient reports continued pain.  Under control but did receive and IV dose of morphine last night due to the pain in addition to her oral medications.  Ambulating to bathroom.  Has low grade temp of 99.7 this morning.  Importance of IS and ambulation discussed.  Eating without problems.  No nausea.  Voiding well.  Continues to have flatus.  Objective: I have reviewed patient's vital signs, intake and output, medications and labs. Vitals:   10/05/16 2110 10/06/16 0534  BP: (!) 107/54 (!) 99/53  Pulse: 76 75  Resp: 16 16  Temp: 98.5 F (36.9 C) 99.7 F (37.6 C)   CBC Latest Ref Rng & Units 10/06/2016 10/05/2016 10/05/2016  WBC 4.0 - 10.5 K/uL 5.5 - 9.7  Hemoglobin 12.0 - 15.0 g/dL 7.8(L) 8.9(L) 9.4(L)  Hematocrit 36.0 - 46.0 % 22.3(L) 25.0(L) 27.0(L)  Platelets 150 - 400 K/uL 143(L) - 195   Physical Exam  Constitutional: She is oriented to person, place, and time. She appears well-developed and well-nourished.  HENT:  Head: Normocephalic and atraumatic.  Cardiovascular: Normal rate, regular rhythm and normal heart sounds.   Pulmonary/Chest: Effort normal. She has no wheezes.  Abdominal: Soft. Bowel sounds are normal. She exhibits no mass. There is tenderness (throughout). There is no rebound and no guarding.  Incisions:  C/D/I  Genitourinary:  Genitourinary Comments: Minimal vaginal bleeding  Neurological: She is alert and oriented to person, place, and time.  Skin: Skin is warm and dry.  Psychiatric: She has a normal mood and affect.  Vitals reviewed.    Assessment: s/p Procedure(s): HYSTERECTOMY TOTAL LAPAROSCOPIC WITH BILATERAL SALPINGO-OOPHORECTOMY AND CYSTOSCOPY PERITONEAL BIOPSY (N/A) CYSTOSCOPY (N/A): Stable Post op hematoma with subsequent anemia, hemoglobin still trending downward  Plan: Will  continue to monitor.  Repeat CBC in 12 hours.  IS and ambulation encouraged. Transition to all po meds Continue SCDs  LOS: 0 days    Valentina ShaggyMILLER, Pierra Skora [facility]NE 10/06/2016, 8:24 AM

## 2016-10-06 NOTE — Discharge Summary (Signed)
Physician Discharge Summary  Patient ID: Kirsten Fischer MRN: [id number] DOB/AGE: 33-Nov-1987 33 y.o.  Admit date: 10/04/2016 Discharge date: 10/06/2016  Admission Diagnoses:  Pelvic pain, h/o endometrial ablation with worsening pelvic pain after this procedure, ADHD  Discharge Diagnoses:  Active Problems:   Endometriosis  Discharged Condition: stable  Hospital Course: Patient admitted through same day surgery.  She was taken to OR where TLH/BSO/cystoscopy were performed.  Surgical findings included significant endometriosis with bilateral endometriomas and significant adhesions of the right ovary to the posterior aspect of the uterus.  Surgery was uneventful.  EBL 100cc.  Foley catheter was removed before leaving OR.  Patient transferred to PACU where she was stable and then to 3rd floor for the remainder of her hospitalization.  During her post-op recovery, her vitals and stable and she was AF.  However, she did have some pain control issues.  Medications were adjusted.  In the morning of POD #1, she has good bowel sounds but more tenderness with her abdominal exam than is normal for a post op pt with a similar procedure.  Her post op Hb was 9.4 which was lower than expected.  Decision made to repeat this later in the day.  This was 8.9.  With continued pain issues, decision was made to proceed with CT of abdomen and pelvis.  This was performed and showed a 10 x 10 x 3cm pelvic hematoma without active bleeding.  Pt began having flatus later in the day of POD#1 as well.  Decision made to monitor pt overnight and repeat CBC in AM.  WBC ct was normal but Hb was 7.8.  Pt was stable with normal VSS and pt was desirous of discharge.  Repeat Hb repeated in 10 hours which was 8.4.  At this point, I felt comfortable with discharge but did offer to monitor one additional evening and plan D/C in the am.  Pt desired discharge home today if possible.  As she is voiding, walking, eating, passing flatus, has normal and  stable VSS, I feel this was appropriate.  She will have close follow up in the office and will be given my direct contact number for communication with any concerns.    Consults: None  Significant Diagnostic Studies: labs: post op H&H and CBC.  Hb 8.4 with most recent lab work and radiology:  CT scan.  Results:  Pelvic hematoma with no active bleeding  Treatments: surgery: TLH/BSO/cystoscopy  Discharge Exam: Blood pressure 100/63, pulse 76, temperature 98.7 F (37.1 C), temperature source Oral, resp. rate 16, height 5\' 8"  (1.727 m), weight 141 lb (64 kg), SpO2 99 %. General appearance: alert and cooperative Resp: clear to auscultation bilaterally Cardio: regular rate and rhythm, S1, S2 normal, no murmur, click, rub or gallop GI: soft, non-tender; bowel sounds normal; no masses,  no organomegaly Incision/Wound: C/D/I  Vaginal bleeding:  minimal  Disposition: 01-Home or Self Care  Discharge Instructions    Call MD for:  difficulty breathing, headache or visual disturbances    Complete by:  As directed    Call MD for:  persistant dizziness or light-headedness    Complete by:  As directed    Call MD for:  persistant nausea and vomiting    Complete by:  As directed    Call MD for:  redness, tenderness, or signs of infection (pain, swelling, redness, odor or green/yellow discharge around incision site)    Complete by:  As directed    Call MD for:  severe uncontrolled pain  Complete by:  As directed    Call MD for:  temperature >100.4    Complete by:  As directed    Diet general    Complete by:  As directed    Increase activity slowly    Complete by:  As directed      Allergies as of 10/06/2016      Reactions   Nitrofurantoin Hives      Medication List    STOP taking these medications   acetaminophen 500 MG tablet Commonly known as:  TYLENOL     TAKE these medications   albuterol (2.5 MG/3ML) 0.083% nebulizer solution Commonly known as:  PROVENTIL 2.5 mg.   alum &  mag hydroxide-simeth 200-200-20 MG/5ML suspension Commonly known as:  MAALOX/MYLANTA Take 30 mLs by mouth every 4 (four) hours as needed for indigestion.   amphetamine-dextroamphetamine 5 MG tablet Commonly known as:  ADDERALL Take 1 tablet by mouth daily.   docusate sodium 100 MG capsule Commonly known as:  COLACE Take 1 capsule (100 mg total) by mouth 2 (two) times daily.   ibuprofen 800 MG tablet Commonly known as:  ADVIL,MOTRIN Take 1 tablet (800 mg total) by mouth every 8 (eight) hours as needed.   lisdexamfetamine 30 MG capsule Commonly known as:  VYVANSE Take 30 mg by mouth daily.   montelukast 10 MG tablet Commonly known as:  SINGULAIR Take 1 tablet (10 mg total) by mouth at bedtime.   norethindrone 5 MG tablet Commonly known as:  AYGESTIN Take 1 tablet (5 mg total) by mouth daily.   oxyCODONE-acetaminophen 5-325 MG tablet Commonly known as:  PERCOCET Take 1-2 tablets by mouth every 6 (six) hours as needed. use only as much as needed to relieve pain      Follow-up Information    Jerene BearsMiller, Loralie Malta S, MD Follow up.   Specialty:  Gynecology Why:  My office will call tomorrow morning for follow up in 1-2 days. Contact information: [address] [place] [address] [phone]           Signed: Annamaria BootsMILLER, Sim Choquette SUZANNE 10/06/2016, 4:14 PM

## 2016-10-06 NOTE — Plan of Care (Signed)
Repeat hb 8.4.  Pt has walked more today.  Voiding well.  Pain control is the same.  VSS/AF.  She desires to go home.  Will be followed very closely.  She will be seen in 24-48 hours in the office and she will have my direct number to call with any issues/concerns.

## 2016-10-06 NOTE — Progress Notes (Signed)
Received call from Dr. Hyacinth MeekerMiller re: pt labwork results. Hemoglobin and Hematacrit have both increased. Dr. Hyacinth MeekerMiller stated she is comfortable discharging pt today or tomorrow and prefers for pt to decide. States if discharged today, she will have her office call her tomorrow morning and she will be seen first thing Fri am. If pt chooses to be discharged tomorrow, she will be seen in the morning by Dr. Hyacinth MeekerMiller and then in her office Monday morning. She asked this nurse to call her on her cell once pt has made a decision.  Discussed above w/pt. Pt chose to be discharged today. Says she has a headache, but thinks it is due to a few home prescription meds she hasn't taken for 3 days. Will notify Dr. Hyacinth MeekerMiller.  Notified Dr. Hyacinth MeekerMiller of above. Dr Hyacinth MeekerMiller stated she will place discharge orders. Reviewed medications she's prescribing with this nurse. States she will provide pt with her cell number via AVS if any concerns arise. States her office will be in close contact with pt.  Will await discharge orders and then discharge pt.

## 2016-10-07 ENCOUNTER — Telehealth: Payer: Self-pay | Admitting: *Deleted

## 2016-10-07 NOTE — Telephone Encounter (Signed)
Call to patient. States she is improving. Reports pain remains about 5-6/10 but feels it is more related to post op pain now rather than severe pain she was having.  States tolerating regular diet and drinking well. Voiding well and passing flatus. No BM yet. Reports eyes are swollen and has some lower back discomfort that she wonders if related to kidney stones. Advised Dr Hyacinth MeekerMiller will review call. Will call back if any additional instructions.  Continue current plan and office visit in am at 930 with Dr Hyacinth MeekerMiller for recheck.

## 2016-10-07 NOTE — Progress Notes (Signed)
Post Operative Visit  Procedure:HYSTERECTOMY TOTAL LAPAROSCOPIC WITH BILATERAL SALPINGO-OOPHORECTOMY AND CYSTOSCOPY PERITONEAL BIOPSY, CYSTOSCOPY  Days Post-op: 4 days  Subjective: Pt reports she is feeling better.  Much better pain control.  No fevers.  Eating fine.  No BM yet.  On colace bid.  Will start adding miralax today.  Feeling a little bloated.  No SOB, palpitations.  States if she gets up really fast, has a little dizziness.  Does have some puffiness around her eyes.  Denies VB.    Taking one percocet 5/325 every 6-7 hours and the motrin 800mg  every 8 hours.  Reviewed surgery, post op labs/course.  Questions answered.  IS and ambulation encouraged.  D/W pt infection risks.  She is checking her temps.  Denies hot flashes.  Started aygestin 5mg  yesterday.  Objective: BP 118/60 (BP Location: Right Arm, Patient Position: Sitting, Cuff Size: Normal)   Pulse 86   Temp 98.5 F (36.9 C) (Oral)   Resp 16   Ht 5\' 8"  (1.727 m)   Wt 154 lb (69.9 kg)   BMI 23.42 kg/m   EXAM General: alert and cooperative Resp: clear to auscultation bilaterally Cardio: regular rate and rhythm, S1, S2 normal, no murmur, click, rub or gallop GI: incision: clean, dry and intact and +BS, soft, appropriately tender. Extremities: extremities normal, atraumatic, no cyanosis or edema Vaginal Bleeding: none  Assessment: s/p TLH/BSO/cystsocopy due to stage iv endometriosis Post op pelvic hematoma, hemodynamically stable today (FS hb 9.0)  Plan: Recheck Monday CBC stat today

## 2016-10-08 ENCOUNTER — Ambulatory Visit (INDEPENDENT_AMBULATORY_CARE_PROVIDER_SITE_OTHER): Payer: PRIVATE HEALTH INSURANCE | Admitting: Obstetrics & Gynecology

## 2016-10-08 ENCOUNTER — Encounter: Payer: Self-pay | Admitting: Obstetrics & Gynecology

## 2016-10-08 VITALS — BP 118/60 | HR 86 | Temp 98.5°F | Resp 16 | Ht 68.0 in | Wt 154.0 lb

## 2016-10-08 DIAGNOSIS — D649 Anemia, unspecified: Secondary | ICD-10-CM

## 2016-10-08 DIAGNOSIS — Z9889 Other specified postprocedural states: Secondary | ICD-10-CM

## 2016-10-08 LAB — CBC
HEMATOCRIT: 25.6 % — AB (ref 34.0–46.6)
HEMOGLOBIN: 8.9 g/dL — AB (ref 11.1–15.9)
MCH: 28.6 pg (ref 26.6–33.0)
MCHC: 34.8 g/dL (ref 31.5–35.7)
MCV: 82 fL (ref 79–97)
Platelets: 199 10*3/uL (ref 150–379)
RBC: 3.11 x10E6/uL — AB (ref 3.77–5.28)
RDW: 12.3 % (ref 12.3–15.4)
WBC: 7.1 10*3/uL (ref 3.4–10.8)

## 2016-10-08 LAB — HEMOGLOBIN, FINGERSTICK: Hemoglobin: 9

## 2016-10-08 NOTE — Progress Notes (Signed)
[facility] [place] [facility] and taken to Dr. Hyacinth MeekerMiller for review.

## 2016-10-11 ENCOUNTER — Other Ambulatory Visit: Payer: Self-pay | Admitting: Obstetrics & Gynecology

## 2016-10-11 ENCOUNTER — Ambulatory Visit (INDEPENDENT_AMBULATORY_CARE_PROVIDER_SITE_OTHER): Payer: PRIVATE HEALTH INSURANCE | Admitting: Obstetrics & Gynecology

## 2016-10-11 ENCOUNTER — Encounter: Payer: Self-pay | Admitting: Obstetrics & Gynecology

## 2016-10-11 VITALS — BP 118/74 | HR 78 | Temp 98.3°F | Resp 14 | Ht 68.0 in | Wt 151.5 lb

## 2016-10-11 DIAGNOSIS — T148XXA Other injury of unspecified body region, initial encounter: Secondary | ICD-10-CM

## 2016-10-11 DIAGNOSIS — G8918 Other acute postprocedural pain: Secondary | ICD-10-CM

## 2016-10-11 LAB — COMPREHENSIVE METABOLIC PANEL
ALT: 50 IU/L — AB (ref 0–32)
AST: 35 IU/L (ref 0–40)
Albumin/Globulin Ratio: 1.8 (ref 1.2–2.2)
Albumin: 3.8 g/dL (ref 3.5–5.5)
Alkaline Phosphatase: 173 IU/L — ABNORMAL HIGH (ref 39–117)
BUN/Creatinine Ratio: 20 (ref 9–23)
BUN: 12 mg/dL (ref 6–20)
Bilirubin Total: 0.7 mg/dL (ref 0.0–1.2)
CALCIUM: 9.3 mg/dL (ref 8.7–10.2)
CO2: 28 mmol/L (ref 20–29)
CREATININE: 0.61 mg/dL (ref 0.57–1.00)
Chloride: 105 mmol/L (ref 96–106)
GFR calc Af Amer: 138 mL/min/{1.73_m2} (ref >59)
GFR, EST NON AFRICAN AMERICAN: 119 mL/min/{1.73_m2} (ref >59)
GLUCOSE: 121 mg/dL — AB (ref 65–99)
Globulin, Total: 2.1 g/dL (ref 1.5–4.5)
POTASSIUM: 3.5 mmol/L (ref 3.5–5.2)
Sodium: 140 mmol/L (ref 134–144)
TOTAL PROTEIN: 5.9 g/dL — AB (ref 6.0–8.5)

## 2016-10-11 LAB — POCT URINALYSIS DIPSTICK
Bilirubin, UA: NEGATIVE
Blood, UA: NEGATIVE
Glucose, UA: NEGATIVE
Ketones, UA: NEGATIVE
NITRITE UA: NEGATIVE
PH UA: 6 (ref 5.0–8.0)
PROTEIN UA: NEGATIVE
Urobilinogen, UA: 0.2 E.U./dL

## 2016-10-11 LAB — CBC
HEMOGLOBIN: 8.9 g/dL — AB (ref 11.1–15.9)
Hematocrit: 25.8 % — ABNORMAL LOW (ref 34.0–46.6)
MCH: 28.3 pg (ref 26.6–33.0)
MCHC: 34.5 g/dL (ref 31.5–35.7)
MCV: 82 fL (ref 79–97)
PLATELETS: 248 10*3/uL (ref 150–379)
RBC: 3.15 x10E6/uL — AB (ref 3.77–5.28)
RDW: 12.3 % (ref 12.3–15.4)
WBC: 6.5 10*3/uL (ref 3.4–10.8)

## 2016-10-11 NOTE — Progress Notes (Signed)
Post Operative Visit  Procedure:HYSTERECTOMY TOTAL LAPAROSCOPIC WITH BILATERAL SALPINGO-OOPHORECTOMY AND CYSTOSCOPY PERITONEAL BIOPSY, CYSTOSCOPY  Days Post-op: 7 days   Subjective: Pt reports Friday and Saturday were better days.  Took almost no Percocet on Saturday.  Energy is still low.  Denies vaginal bleeding.  Taking temperature and no elevated temps (only as high as 99).  However, last night began to have RUQ pain again.  This awoke her from her sleep.  Did not have associated nausea.  Having regular BMs.  Had one on Saturday and Sunday.  Voiding well.  No emesis.  Objective: BP 118/74 (BP Location: Right Arm, Patient Position: Sitting, Cuff Size: Normal)   Pulse 78   Temp 98.3 F (36.8 C) (Oral)   Resp 14   Ht 5\' 8"  (1.727 m)   Wt 151 lb 8 oz (68.7 kg)   LMP 07/03/2013   BMI 23.04 kg/m   EXAM General: alert and cooperative Resp: clear to auscultation bilaterally Cardio: regular rate and rhythm, S1, S2 normal, no murmur, click, rub or gallop GI: soft, mild RUQ tenderness, no tenderness otherwise and exam is improved from last week, normal bowel sounds.  Incisions are healing well with decrease bruising. Extremities: extremities normal, atraumatic, no cyanosis or edema Vaginal Bleeding: none  Gyn:  NAEFG, vagina without lesions, cuff healing well, no fullness or tenderness  Assessment: s/p TLH/BSO/cystoscopy due to stage IV endometriosis Post op pelvic hematoma RUQ pain  Plan: Stat CBC and CMP obtained today Plan follow up on Thursday, pending above results.

## 2016-10-12 ENCOUNTER — Telehealth: Payer: Self-pay

## 2016-10-12 ENCOUNTER — Ambulatory Visit
Admission: RE | Admit: 2016-10-12 | Discharge: 2016-10-12 | Disposition: A | Discharge status: Home / Self Care | Payer: PRIVATE HEALTH INSURANCE | Source: Ambulatory Visit | Attending: Obstetrics & Gynecology | Admitting: Obstetrics & Gynecology

## 2016-10-12 ENCOUNTER — Other Ambulatory Visit: Payer: Self-pay | Admitting: *Deleted

## 2016-10-12 DIAGNOSIS — R1011 Right upper quadrant pain: Secondary | ICD-10-CM

## 2016-10-12 NOTE — Telephone Encounter (Signed)
Received call report on abdominal ultrasound performed at [facility]. Report available in EPIC for review. Printed and to Chubb CorporationDr.Miller's desk. Also faxed to Dr.Mann's office at [phone].

## 2016-10-12 NOTE — Telephone Encounter (Signed)
Spoke with patient. Advised abdominal ultrasound is scheduled for today 10/12/2016 at 10 am will need to arrive at 9:40 am at [facility] [address] Patient is agreeable. Patient has been NPO since midnight and is aware to remain NPO until after her ultrasound.  Spoke with patient. Advised Dr.Mann is able to see her for a work in appointment today after her ultrasound. Will need to head straight to Dr.Mann's office after her ultrasound. Patient verbalizes understanding and requests MyChart message be sent to her with Dr.Mann's address. MyChart message sent with info seen below.  [facility] Dr.Mann [address], [place], [place] [address] [phone]  Routing to provider for final review. Patient agreeable to disposition. Will close encounter.

## 2016-10-12 NOTE — Telephone Encounter (Signed)
-----   Message from Jerene BearsMary S Miller, MD sent at 10/11/2016 11:49 PM EDT ----- Spoke with pt personally regarding results.  Advised to avoid high fat foods.  RUQ ultrasound will need to be scheduled.

## 2016-10-13 ENCOUNTER — Telehealth: Payer: Self-pay | Admitting: *Deleted

## 2016-10-13 NOTE — Telephone Encounter (Signed)
Call to patient. Advised Dr Hyacinth MeekerMiller has reviewed call and instructed to keep appointment as scheduled for tomorrow.  States needed Percocet 1 tab Q 4 hours yesterday but has not taken any today.  Advised to call back if pain increases to same level as yesterday., or any other changes.   Routing to provider for final review. Patient agreeable to disposition. Will close encounter.

## 2016-10-13 NOTE — Telephone Encounter (Signed)
Phone report given to Dr Hyacinth MeekerMiller approximately [phone]. Patient to take Tylenol if she feels is needed. Keep appointment as scheduled for tomorrow. Call back for any changes.

## 2016-10-13 NOTE — Telephone Encounter (Signed)
Agree with recommendations.  Will see her tomorrow.

## 2016-10-13 NOTE — Telephone Encounter (Signed)
Call to patient for status update. States she had temp last evening of 100.6. Took one Tylenol and temp this am is 98.4. Complains of lower abdominal crampiness that was worse last nigh but is tolerable this am. Also has back discomfort that patient feels is related to kidney stones. Painful to void. Again, feels related to stones. Denies feeling UTI symptoms. BM yesterday. Passing flatus. Abdoiminal incision without redness or swelling per patient.  RUQ pain fully resolved.  Patient scheduled to see Dr Hyacinth MeekerMiller tomorrow.  Advised Dr Hyacinth MeekerMiller will review call and we will call her back if any additional instructions.

## 2016-10-14 ENCOUNTER — Telehealth: Payer: Self-pay

## 2016-10-14 ENCOUNTER — Encounter: Payer: Self-pay | Admitting: Obstetrics & Gynecology

## 2016-10-14 ENCOUNTER — Ambulatory Visit (INDEPENDENT_AMBULATORY_CARE_PROVIDER_SITE_OTHER): Payer: PRIVATE HEALTH INSURANCE | Admitting: Obstetrics & Gynecology

## 2016-10-14 ENCOUNTER — Inpatient Hospital Stay (HOSPITAL_COMMUNITY): Payer: PRIVATE HEALTH INSURANCE

## 2016-10-14 ENCOUNTER — Inpatient Hospital Stay (HOSPITAL_COMMUNITY)
Admission: AD | Admit: 2016-10-14 | Discharge: 2016-10-18 | DRG: 920 | Disposition: A | Discharge status: Home / Self Care | Payer: PRIVATE HEALTH INSURANCE | Source: Ambulatory Visit | Attending: Obstetrics & Gynecology | Admitting: Obstetrics & Gynecology

## 2016-10-14 VITALS — BP 118/70 | HR 90 | Temp 98.5°F | Resp 14 | Ht 68.0 in | Wt 148.5 lb

## 2016-10-14 DIAGNOSIS — R309 Painful micturition, unspecified: Secondary | ICD-10-CM | POA: Diagnosis not present

## 2016-10-14 DIAGNOSIS — N99820 Postprocedural hemorrhage and hematoma of a genitourinary system organ or structure following a genitourinary system procedure: Secondary | ICD-10-CM | POA: Diagnosis not present

## 2016-10-14 DIAGNOSIS — N99840 Postprocedural hematoma of a genitourinary system organ or structure following a genitourinary system procedure: Secondary | ICD-10-CM | POA: Diagnosis not present

## 2016-10-14 DIAGNOSIS — R102 Pelvic and perineal pain: Secondary | ICD-10-CM

## 2016-10-14 DIAGNOSIS — D72829 Elevated white blood cell count, unspecified: Secondary | ICD-10-CM | POA: Diagnosis present

## 2016-10-14 DIAGNOSIS — T8131XA Disruption of external operation (surgical) wound, not elsewhere classified, initial encounter: Secondary | ICD-10-CM | POA: Diagnosis present

## 2016-10-14 DIAGNOSIS — N739 Female pelvic inflammatory disease, unspecified: Secondary | ICD-10-CM

## 2016-10-14 DIAGNOSIS — Z87891 Personal history of nicotine dependence: Secondary | ICD-10-CM

## 2016-10-14 DIAGNOSIS — D649 Anemia, unspecified: Secondary | ICD-10-CM | POA: Diagnosis present

## 2016-10-14 DIAGNOSIS — K5900 Constipation, unspecified: Secondary | ICD-10-CM | POA: Diagnosis not present

## 2016-10-14 DIAGNOSIS — N9489 Other specified conditions associated with female genital organs and menstrual cycle: Secondary | ICD-10-CM

## 2016-10-14 LAB — CBC
HEMATOCRIT: 29.7 % — AB (ref 36.0–46.0)
HEMOGLOBIN: 10.3 g/dL — AB (ref 11.1–15.9)
HEMOGLOBIN: 10 g/dL — AB (ref 12.0–15.0)
Hematocrit: 29.8 % — ABNORMAL LOW (ref 34.0–46.6)
MCH: 28 pg (ref 26.6–33.0)
MCH: 28.2 pg (ref 26.0–34.0)
MCHC: 34.6 g/dL (ref 31.5–35.7)
MCHC: 33.7 g/dL (ref 30.0–36.0)
MCV: 81 fL (ref 79–97)
MCV: 83.9 fL (ref 78.0–100.0)
Platelets: 452 10*3/uL — ABNORMAL HIGH (ref 150–379)
Platelets: 431 10*3/uL — ABNORMAL HIGH (ref 150–400)
RBC: 3.68 x10E6/uL — AB (ref 3.77–5.28)
RBC: 3.54 MIL/uL — ABNORMAL LOW (ref 3.87–5.11)
RDW: 13.2 % (ref 12.3–15.4)
RDW: 12.4 % (ref 11.5–15.5)
WBC: 8.6 10*3/uL (ref 3.4–10.8)
WBC: 10.8 10*3/uL — ABNORMAL HIGH (ref 4.0–10.5)

## 2016-10-14 LAB — POCT URINALYSIS DIPSTICK
Bilirubin, UA: NEGATIVE
GLUCOSE UA: NEGATIVE
Ketones, UA: NEGATIVE
NITRITE UA: NEGATIVE
Protein, UA: NEGATIVE
UROBILINOGEN UA: 0.2 U/dL
pH, UA: 5 (ref 5.0–8.0)

## 2016-10-14 MED ORDER — LACTATED RINGERS IV BOLUS (SEPSIS): 500.0000 mL | Freq: Once | INTRAVENOUS | Status: DC

## 2016-10-14 MED ORDER — IOPAMIDOL (ISOVUE-300) INJECTION 61%
100.0000 mL | Freq: Once | INTRAVENOUS | Status: AC | PRN
  Administered 2016-10-14: 100 mL via INTRAVENOUS

## 2016-10-14 MED ORDER — PIPERACILLIN-TAZOBACTAM 3.375 G IVPB
3.3750 g | Freq: Three times a day (TID) | INTRAVENOUS | Status: DC
  Administered 2016-10-14 – 2016-10-16 (×6): 3.375 g via INTRAVENOUS
  Filled 2016-10-14 (×7): qty 50

## 2016-10-14 MED ORDER — SULFAMETHOXAZOLE-TRIMETHOPRIM 800-160 MG PO TABS: 1.0000 | ORAL_TABLET | Freq: Two times a day (BID) | ORAL | 0 refills | Status: DC

## 2016-10-14 MED ORDER — IOPAMIDOL (ISOVUE-300) INJECTION 61%
30.0000 mL | INTRAVENOUS | Status: AC
  Administered 2016-10-14: 30 mL via ORAL

## 2016-10-14 NOTE — H&P (Signed)
Kirsten ChafeLauren Robertson is an 31 y.o. female G4P3 DWF here for evaluation after calling me early this evening with complaint of vaginal bleeding.  Pt has hx of TLH/BSO/cystoscopy due to Stage IV endometriosis.  Post op course was complicated by post-op pelvic hematoma.  Her hemoglobin stabilized and she did not require transfusion.  She has been followed as an outpatient and was actually seen in the office this afternoon.  She's been having bowel movements and no trouble with voiding.  Denies fevers.  She has experienced a change in her pelvic cramping in the last 24 hours.  The vaginal bleeding this evening has an odor as well.    Pertinent Gynecological History: Contraception: status post hysterectomy DES exposure: denies Blood transfusions: none Sexually transmitted diseases: no past history OB History: G4, P3   Menstrual History: Patient's last menstrual period was 07/03/2013.    Past Medical History:  Diagnosis Date  . Asthma   . Complication of anesthesia   . GERD (gastroesophageal reflux disease)   . History of kidney infection during pregnancy   . PONV (postoperative nausea and vomiting)     Past Surgical History:  Procedure Laterality Date  . CYSTOSCOPY N/A 10/04/2016   Procedure: CYSTOSCOPY;  Surgeon: Jerene BearsMiller, Arlie Riker S, MD;  [facility];  Service: Gynecology;  Laterality: N/A;  . ENDOMETRIAL ABLATION    . fatty tumor removed    . MANDIBLE FRACTURE SURGERY    . TOTAL LAPAROSCOPIC HYSTERECTOMY WITH SALPINGECTOMY N/A 10/04/2016   Procedure: HYSTERECTOMY TOTAL LAPAROSCOPIC WITH BILATERAL SALPINGO-OOPHORECTOMY AND CYSTOSCOPY PERITONEAL BIOPSY;  Surgeon: Jerene BearsMiller, Catalina Salasar S, MD;  [facility];  Service: Gynecology;  Laterality: N/A;  . WISDOM TOOTH EXTRACTION  12 years ago    Family History  Problem Relation Age of Onset  . Hypertension Father   . Asthma Sister   . Diabetes Paternal Grandfather   . Diabetes Maternal Grandmother   . Breast  cancer Maternal Grandmother   . Breast cancer Mother [id number]  . Lymphoma Paternal Grandmother        Non Hodgskins Lymphoma  . Anesthesia problems Neg Hx   . Hypotension Neg Hx   . Malignant hyperthermia Neg Hx   . Pseudochol deficiency Neg Hx     Social History:  reports that she quit smoking about 3 years ago. Her smoking use included Cigarettes. She has never used smokeless tobacco. She reports that she drinks alcohol. She reports that she does not use drugs.  Allergies:  Allergies  Allergen Reactions  . Nitrofurantoin Hives    Prescriptions Prior to Admission  Medication Sig Dispense Refill Last Dose  . albuterol (PROVENTIL) (2.5 MG/3ML) 0.083% nebulizer solution 2.5 mg.   Taking  . alum & mag hydroxide-simeth (MAALOX/MYLANTA) 200-200-20 MG/5ML suspension Take 30 mLs by mouth every 4 (four) hours as needed for indigestion. 355 mL 0 Taking  . amphetamine-dextroamphetamine (ADDERALL) 5 MG tablet Take 1 tablet by mouth daily.  0 Taking  . DiphenhydrAMINE HCl (BENADRYL PO) Take by mouth. As needed   Taking  . ibuprofen (ADVIL,MOTRIN) 800 MG tablet Take 1 tablet (800 mg total) by mouth every 8 (eight) hours as needed. 30 tablet 0 Taking  . lisdexamfetamine (VYVANSE) 30 MG capsule Take 30 mg by mouth daily.   Taking  . montelukast (SINGULAIR) 10 MG tablet Take 1 tablet (10 mg total) by mouth at bedtime. 90 tablet 4 Taking  . norethindrone (AYGESTIN) 5 MG tablet Take 1 tablet (5 mg total) by mouth daily. 30  tablet 0 Taking  . oxyCODONE-acetaminophen (PERCOCET) 5-325 MG tablet Take 1-2 tablets by mouth every 6 (six) hours as needed. use only as much as needed to relieve pain 30 tablet 0 Taking  . sulfamethoxazole-trimethoprim (BACTRIM DS,SEPTRA DS) 800-160 MG tablet Take 1 tablet by mouth 2 (two) times daily. 10 tablet 0     Review of Systems  Constitutional: Negative.   Respiratory: Negative.   Cardiovascular: Negative.   Gastrointestinal: Negative.   Genitourinary:       Vaginal  bleeding  Skin: Negative.     Blood pressure 122/81, pulse 98, resp. rate 16, last menstrual period 07/03/2013. Physical Exam  Constitutional: She is oriented to person, place, and time. She appears well-developed and well-nourished.  Cardiovascular: Normal rate and regular rhythm.   Respiratory: Effort normal and breath sounds normal.  Genitourinary: There is no rash, tenderness, lesion or injury on the right labia. There is no rash, tenderness, lesion or injury on the left labia.  Genitourinary Comments: Uterus, cervix surgically absent.  Dark, old appearing blood that has a watery consistency to it.  Odor is present.  Cuff is completely intact.  Mild tenderness along the left side present.  Lymphadenopathy:       Right: No inguinal adenopathy present.       Left: No inguinal adenopathy present.  Neurological: She is alert and oriented to person, place, and time.  Skin: Skin is warm and dry.  Psychiatric: She has a normal mood and affect.    Results for orders placed or performed during the hospital encounter of 10/14/16 (from the past 24 hour(s))  CBC     Status: Abnormal   Collection Time: 10/14/16  8:19 PM  Result Value Ref Range   WBC 10.8 (H) 4.0 - 10.5 K/uL   RBC 3.54 (L) 3.87 - 5.11 MIL/uL   Hemoglobin 10.0 (L) 12.0 - 15.0 g/dL   HCT 16.1 (L) 09.6 - 04.5 %   MCV 83.9 78.0 - 100.0 fL   MCH 28.2 26.0 - 34.0 pg   MCHC 33.7 30.0 - 36.0 g/dL   RDW 40.9 81.1 - 91.4 %   Platelets 431 (H) 150 - 400 K/uL    No results found.  Assessment/Plan: 31 yo G4P3 DWF here with vaginal bleeding likely from pelvic hematoma, now with new elevation in WBC ct.  Will obtain CT abd/pelvis with contrast and start on IV Zosyn.  Will admit for observation and plan repeat CBC in AM.  Valentina Shaggy SUZANNE 10/14/2016, 9:10 PM

## 2016-10-14 NOTE — Telephone Encounter (Signed)
Spoke with patient. Advised WBC is normal. Hemoglobin level is 10.3 and platelets are high at 451 due to anemia which is expected. Patient verbalizes understanding of all results.  Routing to provider for final review. Patient agreeable to disposition. Will close encounter.

## 2016-10-14 NOTE — Progress Notes (Signed)
Post Operative Visit  Procedure:HYSTERECTOMY TOTAL LAPAROSCOPIC WITH BILATERAL SALPINGO-OOPHORECTOMY AND CYSTOSCOPY PERITONEAL BIOPSY, CYSTOSCOPY  Days Post-op: 10 years  Subjective: Doing well.  States she feels great.  Hasn't taken any pain medication in >24 hours.  Having some pelvic cramping and some pressure with urination.  No vaginal bleeding.  Having BMs.  Voiding well.  Energy better.  Wants to talk about going back to work part time starting next week.  Denies fever.  Thinks she's started having some hot flashes.  Tolerable.  States she is having hives each morning, not sure why.  Taking benadryl at night.  Reports h/o hives "with almost anything".  States has hives after hot shower every day.  No SOB.  No skin abnormalities for me to assess today.  Objective: LMP 07/03/2013   EXAM General: alert and cooperative Resp: clear to auscultation bilaterally Cardio: regular rate and rhythm, S1, S2 normal, no murmur, click, rub or gallop GI: soft, non-tender; bowel sounds normal; no masses,  no organomegaly and incision: clean, dry and intact Extremities: extremities normal, atraumatic, no cyanosis or edema Vaginal Bleeding: none  Gyn:  NAEFG, vaginal with scant dark discharge, no fullness/masses, cuff intact, nontender  Assessment: s/p TLH/BSO/cystoscopy with post op pelvic hematoma and anemia, improved Possible UTI Hives?  Plan: Recheck 10 weeks Stat CBC today Bactrim DS BID x 5 days.  Will start treatment while waiting for urine culture which is sent today Will not make any changes to medications at this point related to possible hives.  Would prefer pt just monitor and plan to change to HRT in about two weeks.  Only new medication is aygestin but as she is having hot flashes, feel this should continue.

## 2016-10-15 ENCOUNTER — Encounter (HOSPITAL_COMMUNITY): Payer: Self-pay | Admitting: *Deleted

## 2016-10-15 DIAGNOSIS — N99840 Postprocedural hematoma of a genitourinary system organ or structure following a genitourinary system procedure: Secondary | ICD-10-CM | POA: Diagnosis present

## 2016-10-15 DIAGNOSIS — D72829 Elevated white blood cell count, unspecified: Secondary | ICD-10-CM | POA: Diagnosis present

## 2016-10-15 DIAGNOSIS — K5900 Constipation, unspecified: Secondary | ICD-10-CM | POA: Diagnosis not present

## 2016-10-15 DIAGNOSIS — D649 Anemia, unspecified: Secondary | ICD-10-CM | POA: Diagnosis present

## 2016-10-15 DIAGNOSIS — T8131XA Disruption of external operation (surgical) wound, not elsewhere classified, initial encounter: Secondary | ICD-10-CM | POA: Diagnosis present

## 2016-10-15 DIAGNOSIS — Z87891 Personal history of nicotine dependence: Secondary | ICD-10-CM | POA: Diagnosis not present

## 2016-10-15 DIAGNOSIS — N99820 Postprocedural hemorrhage and hematoma of a genitourinary system organ or structure following a genitourinary system procedure: Secondary | ICD-10-CM | POA: Diagnosis present

## 2016-10-15 DIAGNOSIS — N739 Female pelvic inflammatory disease, unspecified: Secondary | ICD-10-CM

## 2016-10-15 LAB — CBC
HCT: 25.4 % — ABNORMAL LOW (ref 36.0–46.0)
HEMATOCRIT: 26.7 % — AB (ref 36.0–46.0)
HEMOGLOBIN: 8.8 g/dL — AB (ref 12.0–15.0)
Hemoglobin: 9 g/dL — ABNORMAL LOW (ref 12.0–15.0)
MCH: 28.7 pg (ref 26.0–34.0)
MCH: 28.2 pg (ref 26.0–34.0)
MCHC: 34.6 g/dL (ref 30.0–36.0)
MCHC: 33.7 g/dL (ref 30.0–36.0)
MCV: 82.7 fL (ref 78.0–100.0)
MCV: 83.7 fL (ref 78.0–100.0)
Platelets: 354 10*3/uL (ref 150–400)
Platelets: 410 10*3/uL — ABNORMAL HIGH (ref 150–400)
RBC: 3.07 MIL/uL — AB (ref 3.87–5.11)
RBC: 3.19 MIL/uL — ABNORMAL LOW (ref 3.87–5.11)
RDW: 12.6 % (ref 11.5–15.5)
RDW: 12.6 % (ref 11.5–15.5)
WBC: 9.5 10*3/uL (ref 4.0–10.5)
WBC: 10.3 10*3/uL (ref 4.0–10.5)

## 2016-10-15 LAB — ABO/RH: ABO/RH(D): A POS

## 2016-10-15 LAB — PROTIME-INR
INR: 1.22
Prothrombin Time: 15.5 seconds — ABNORMAL HIGH (ref 11.4–15.2)

## 2016-10-15 LAB — TYPE AND SCREEN
ABO/RH(D): A POS
Antibody Screen: NEGATIVE

## 2016-10-15 LAB — URINE CULTURE

## 2016-10-15 LAB — APTT: APTT: 36 s (ref 24–36)

## 2016-10-15 MED ORDER — SIMETHICONE 80 MG PO CHEW: 80.0000 mg | CHEWABLE_TABLET | Freq: Four times a day (QID) | ORAL | Status: DC | PRN

## 2016-10-15 MED ORDER — ONDANSETRON HCL 4 MG/2ML IJ SOLN: 4.0000 mg | Freq: Four times a day (QID) | INTRAMUSCULAR | Status: DC | PRN

## 2016-10-15 MED ORDER — NORETHINDRONE ACETATE 5 MG PO TABS
5.0000 mg | ORAL_TABLET | Freq: Every day | ORAL | Status: DC
  Administered 2016-10-15 – 2016-10-18 (×4): 5 mg via ORAL
  Filled 2016-10-15 (×5): qty 1

## 2016-10-15 MED ORDER — DEXTROSE-NACL 5-0.45 % IV SOLN
INTRAVENOUS | Status: DC
  Administered 2016-10-15: 125 mL/h via INTRAVENOUS
  Administered 2016-10-15 – 2016-10-18 (×6): via INTRAVENOUS

## 2016-10-15 MED ORDER — MENTHOL 3 MG MT LOZG: 1.0000 | LOZENGE | OROMUCOSAL | Status: DC | PRN

## 2016-10-15 MED ORDER — OXYCODONE-ACETAMINOPHEN 5-325 MG PO TABS
1.0000 | ORAL_TABLET | ORAL | Status: DC | PRN
  Administered 2016-10-15 – 2016-10-16 (×4): 1 via ORAL
  Filled 2016-10-15 (×4): qty 1

## 2016-10-15 MED ORDER — DOCUSATE SODIUM 100 MG PO CAPS
100.0000 mg | ORAL_CAPSULE | Freq: Two times a day (BID) | ORAL | Status: DC
  Administered 2016-10-15 – 2016-10-18 (×7): 100 mg via ORAL
  Filled 2016-10-15 (×7): qty 1

## 2016-10-15 MED ORDER — MORPHINE SULFATE (PF) 4 MG/ML IV SOLN: 1.0000 mg | INTRAVENOUS | Status: DC | PRN

## 2016-10-15 MED ORDER — IBUPROFEN 800 MG PO TABS
800.0000 mg | ORAL_TABLET | Freq: Three times a day (TID) | ORAL | Status: DC
  Administered 2016-10-15 – 2016-10-18 (×9): 800 mg via ORAL
  Filled 2016-10-15 (×11): qty 1

## 2016-10-15 MED ORDER — ACETAMINOPHEN 325 MG PO TABS
650.0000 mg | ORAL_TABLET | ORAL | Status: DC | PRN
  Administered 2016-10-15: 650 mg via ORAL
  Filled 2016-10-15: qty 2

## 2016-10-15 MED ORDER — DIPHENHYDRAMINE HCL 25 MG PO CAPS
25.0000 mg | ORAL_CAPSULE | Freq: Every evening | ORAL | Status: DC | PRN
  Administered 2016-10-15 – 2016-10-17 (×3): 25 mg via ORAL
  Filled 2016-10-15 (×3): qty 1

## 2016-10-15 MED ORDER — ONDANSETRON HCL 4 MG PO TABS: 4.0000 mg | ORAL_TABLET | Freq: Four times a day (QID) | ORAL | Status: DC | PRN

## 2016-10-15 NOTE — Progress Notes (Addendum)
Subjective: Still having vaginal drainage.  Is now very dark in appearance.  Still has odor to it.  Having some cramping.  Took one Percocet this morning.  Denies SOB or palpitations.  Objective: I have reviewed patient's vital signs, intake and output, medications and labs. CBC    Component Value Date/Time   WBC 9.5 10/15/2016 0532   RBC 3.07 (L) 10/15/2016 0532   HGB 8.8 (L) 10/15/2016 0532   HGB 10.3 (L) 10/14/2016 1453   HGB 13.4 02/21/2014 1539   HCT 25.4 (L) 10/15/2016 0532   HCT 29.8 (L) 10/14/2016 1453   PLT 354 10/15/2016 0532   PLT 452 (H) 10/14/2016 1453   MCV 82.7 10/15/2016 0532   MCV 81 10/14/2016 1453   MCH 28.7 10/15/2016 0532   MCHC 34.6 10/15/2016 0532   RDW 12.6 10/15/2016 0532   RDW 13.2 10/14/2016 1453   LYMPHSABS 2.3 07/10/2013 1545   MONOABS 0.5 07/10/2013 1545   EOSABS 0.2 07/10/2013 1545   BASOSABS 0.0 07/10/2013 1545    General: alert and cooperative Resp: clear to auscultation bilaterally Cardio: regular rate and rhythm, S1, S2 normal, no murmur, click, rub or gallop GI: soft, non-tender; bowel sounds normal; no masses,  no organomegaly and incision: clean, dry and intact Extremities: extremities normal, atraumatic, no cyanosis or edema Vaginal Bleeding: dark drainage pad that has been on about 2 1/2 horus is about 1/4-/13 full   Assessment/Plan: Pelvic infection/early abscess with post op pelvic hematoma Anemia  Continue zosyn q 8 Repeat CBC, coags, type and screen  LOS: 1 day    Valentina ShaggyMILLER, Nima Bamburg SUZANNE 10/15/2016, 9:39 AM

## 2016-10-15 NOTE — Progress Notes (Signed)
Subjective: Reports she is having cramping but not taking anything but Motrin.  Having some hot flashes which may be related to infection.  Vaginal drainage has decreased in amount.  Still with odor.  Labs from 4 o'clock reviewed.  Hb stable at 9.  WBC ct 10.3.  Coags normal.    Objective: I have reviewed patient's vital signs, intake and output, medications and labs.  General: alert and cooperative Resp: clear to auscultation bilaterally Cardio: regular rate and rhythm, S1, S2 normal, no murmur, click, rub or gallop GI: incision: clean, dry and intact and abdomen is soft, non-distended but tender to palpation in suprapubic region Extremities: extremities normal, atraumatic, no cyanosis or edema Vaginal Bleeding: dark, black drainage, no bright red bleeding   Assessment/Plan: Pelvic infection Pelvic hematoma after TLH/BSO/cystoscopy  Continue Zosyn Repeat CBC in am  LOS: 0 days    Kirsten Fischer, Damico Partin SUZANNE 10/15/2016, 6:24 PM

## 2016-10-16 ENCOUNTER — Inpatient Hospital Stay (HOSPITAL_COMMUNITY): Payer: PRIVATE HEALTH INSURANCE | Admitting: Anesthesiology

## 2016-10-16 ENCOUNTER — Encounter (HOSPITAL_COMMUNITY): Payer: Self-pay | Admitting: *Deleted

## 2016-10-16 ENCOUNTER — Encounter (HOSPITAL_COMMUNITY)
Admission: AD | Disposition: A | Discharge status: Home / Self Care | Payer: Self-pay | Source: Ambulatory Visit | Attending: Obstetrics & Gynecology

## 2016-10-16 HISTORY — PX: REPAIR VAGINAL CUFF: SHX6067

## 2016-10-16 LAB — CBC WITH DIFFERENTIAL/PLATELET
Basophils Absolute: 0 10*3/uL (ref 0.0–0.1)
Basophils Relative: 0 %
Eosinophils Absolute: 0.1 10*3/uL (ref 0.0–0.7)
Eosinophils Relative: 1 %
HEMATOCRIT: 25.6 % — AB (ref 36.0–46.0)
HEMOGLOBIN: 8.8 g/dL — AB (ref 12.0–15.0)
LYMPHS ABS: 1.6 10*3/uL (ref 0.7–4.0)
LYMPHS PCT: 14 %
MCH: 28.7 pg (ref 26.0–34.0)
MCHC: 34.4 g/dL (ref 30.0–36.0)
MCV: 83.4 fL (ref 78.0–100.0)
Monocytes Absolute: 0.7 10*3/uL (ref 0.1–1.0)
Monocytes Relative: 6 %
NEUTROS ABS: 8.9 10*3/uL — AB (ref 1.7–7.7)
NEUTROS PCT: 79 %
Platelets: 361 10*3/uL (ref 150–400)
RBC: 3.07 MIL/uL — AB (ref 3.87–5.11)
RDW: 12.7 % (ref 11.5–15.5)
WBC: 11.3 10*3/uL — AB (ref 4.0–10.5)

## 2016-10-16 LAB — CBC
HCT: 27.9 % — ABNORMAL LOW (ref 36.0–46.0)
HEMOGLOBIN: 9.4 g/dL — AB (ref 12.0–15.0)
MCH: 28.4 pg (ref 26.0–34.0)
MCHC: 33.7 g/dL (ref 30.0–36.0)
MCV: 84.3 fL (ref 78.0–100.0)
PLATELETS: 399 10*3/uL (ref 150–400)
RBC: 3.31 MIL/uL — AB (ref 3.87–5.11)
RDW: 12.6 % (ref 11.5–15.5)
WBC: 16.3 10*3/uL — AB (ref 4.0–10.5)

## 2016-10-16 SURGERY — REPAIR, VAGINAL CUFF
Anesthesia: General

## 2016-10-16 MED ORDER — ONDANSETRON HCL 4 MG/2ML IJ SOLN
INTRAMUSCULAR | Status: DC | PRN
  Administered 2016-10-16: 4 mg via INTRAVENOUS

## 2016-10-16 MED ORDER — FENTANYL CITRATE (PF) 250 MCG/5ML IJ SOLN
INTRAMUSCULAR | Status: AC
  Filled 2016-10-16: qty 5

## 2016-10-16 MED ORDER — PROMETHAZINE HCL 25 MG/ML IJ SOLN
6.2500 mg | INTRAMUSCULAR | Status: DC | PRN
Start: 2016-10-16 — End: 2016-10-16

## 2016-10-16 MED ORDER — SUGAMMADEX SODIUM 200 MG/2ML IV SOLN
INTRAVENOUS | Status: AC
  Filled 2016-10-16: qty 2

## 2016-10-16 MED ORDER — OXYCODONE-ACETAMINOPHEN 5-325 MG PO TABS
1.0000 | ORAL_TABLET | ORAL | Status: DC | PRN
  Administered 2016-10-17: 1 via ORAL
  Administered 2016-10-17: 2 via ORAL
  Administered 2016-10-18 (×2): 1 via ORAL
  Filled 2016-10-16: qty 1
  Filled 2016-10-16: qty 2
  Filled 2016-10-16: qty 1
  Filled 2016-10-16: qty 2

## 2016-10-16 MED ORDER — MIDAZOLAM HCL 2 MG/2ML IJ SOLN
INTRAMUSCULAR | Status: DC | PRN
Start: 2016-10-16 — End: 2016-10-16
  Administered 2016-10-16: 2 mg via INTRAVENOUS

## 2016-10-16 MED ORDER — OXYCODONE HCL 5 MG/5ML PO SOLN: 5.0000 mg | Freq: Once | ORAL | Status: AC | PRN

## 2016-10-16 MED ORDER — FAMOTIDINE IN NACL 20-0.9 MG/50ML-% IV SOLN
20.0000 mg | Freq: Once | INTRAVENOUS | Status: AC
  Administered 2016-10-16: 10 mg via INTRAVENOUS
  Filled 2016-10-16: qty 50

## 2016-10-16 MED ORDER — SOD CITRATE-CITRIC ACID 500-334 MG/5ML PO SOLN: 30.0000 mL | Freq: Once | ORAL | Status: DC

## 2016-10-16 MED ORDER — MIDAZOLAM HCL 2 MG/2ML IJ SOLN
INTRAMUSCULAR | Status: AC
  Filled 2016-10-16: qty 2

## 2016-10-16 MED ORDER — IBUPROFEN 800 MG PO TABS
800.0000 mg | ORAL_TABLET | Freq: Three times a day (TID) | ORAL | Status: DC | PRN
  Administered 2016-10-17: 800 mg via ORAL
  Filled 2016-10-16: qty 1

## 2016-10-16 MED ORDER — SUGAMMADEX SODIUM 200 MG/2ML IV SOLN
INTRAVENOUS | Status: DC | PRN
  Administered 2016-10-16: 134.2 mg via INTRAVENOUS

## 2016-10-16 MED ORDER — OXYCODONE HCL 5 MG PO TABS
ORAL_TABLET | ORAL | Status: AC
  Filled 2016-10-16: qty 1

## 2016-10-16 MED ORDER — ALBUTEROL SULFATE (2.5 MG/3ML) 0.083% IN NEBU: 3.0000 mL | INHALATION_SOLUTION | Freq: Four times a day (QID) | RESPIRATORY_TRACT | Status: DC | PRN

## 2016-10-16 MED ORDER — FENTANYL CITRATE (PF) 100 MCG/2ML IJ SOLN
INTRAMUSCULAR | Status: AC
  Filled 2016-10-16: qty 2

## 2016-10-16 MED ORDER — KETOROLAC TROMETHAMINE 30 MG/ML IJ SOLN
INTRAMUSCULAR | Status: DC | PRN
  Administered 2016-10-16: 30 mg via INTRAVENOUS

## 2016-10-16 MED ORDER — PROPOFOL 10 MG/ML IV BOLUS
INTRAVENOUS | Status: DC | PRN
  Administered 2016-10-16: 50 mg via INTRAVENOUS
  Administered 2016-10-16: 150 mg via INTRAVENOUS

## 2016-10-16 MED ORDER — CLINDAMYCIN PHOSPHATE 900 MG/50ML IV SOLN
900.0000 mg | Freq: Three times a day (TID) | INTRAVENOUS | Status: DC
  Administered 2016-10-16 – 2016-10-18 (×6): 900 mg via INTRAVENOUS
  Filled 2016-10-16 (×8): qty 50

## 2016-10-16 MED ORDER — KETOROLAC TROMETHAMINE 30 MG/ML IJ SOLN
INTRAMUSCULAR | Status: AC
  Filled 2016-10-16: qty 1

## 2016-10-16 MED ORDER — LACTATED RINGERS IV SOLN
INTRAVENOUS | Status: DC | PRN
Start: 2016-10-16 — End: 2016-10-16
  Administered 2016-10-16: 17:00:00 via INTRAVENOUS

## 2016-10-16 MED ORDER — FENTANYL CITRATE (PF) 100 MCG/2ML IJ SOLN
INTRAMUSCULAR | Status: DC | PRN
  Administered 2016-10-16: 100 ug via INTRAVENOUS
  Administered 2016-10-16: 50 ug via INTRAVENOUS
  Administered 2016-10-16: 100 ug via INTRAVENOUS

## 2016-10-16 MED ORDER — ONDANSETRON HCL 4 MG/2ML IJ SOLN
INTRAMUSCULAR | Status: AC
  Filled 2016-10-16: qty 2

## 2016-10-16 MED ORDER — LIDOCAINE HCL (CARDIAC) 20 MG/ML IV SOLN
INTRAVENOUS | Status: DC | PRN
  Administered 2016-10-16: 100 mg via INTRAVENOUS

## 2016-10-16 MED ORDER — ROCURONIUM BROMIDE 100 MG/10ML IV SOLN
INTRAVENOUS | Status: DC | PRN
  Administered 2016-10-16: 30 mg via INTRAVENOUS

## 2016-10-16 MED ORDER — SOD CITRATE-CITRIC ACID 500-334 MG/5ML PO SOLN
ORAL | Status: AC
  Administered 2016-10-16: 16:00:00
  Filled 2016-10-16: qty 15

## 2016-10-16 MED ORDER — LIDOCAINE HCL (CARDIAC) 20 MG/ML IV SOLN
INTRAVENOUS | Status: AC
  Filled 2016-10-16: qty 5

## 2016-10-16 MED ORDER — AMPICILLIN SODIUM 2 G IJ SOLR
2.0000 g | Freq: Four times a day (QID) | INTRAMUSCULAR | Status: DC
  Administered 2016-10-16 – 2016-10-18 (×8): 2 g via INTRAVENOUS
  Filled 2016-10-16 (×10): qty 2000

## 2016-10-16 MED ORDER — GENTAMICIN SULFATE 40 MG/ML IJ SOLN
5.0000 mg/kg | INTRAVENOUS | Status: DC
  Administered 2016-10-16: 326 mg via INTRAVENOUS
  Administered 2016-10-16 – 2016-10-17 (×2): 330 mg via INTRAVENOUS
  Filled 2016-10-16 (×3): qty 8.25

## 2016-10-16 MED ORDER — PROPOFOL 10 MG/ML IV BOLUS
INTRAVENOUS | Status: AC
  Filled 2016-10-16: qty 20

## 2016-10-16 MED ORDER — DOCUSATE SODIUM 100 MG PO CAPS: 100.0000 mg | ORAL_CAPSULE | Freq: Two times a day (BID) | ORAL | Status: DC | PRN

## 2016-10-16 MED ORDER — ROCURONIUM BROMIDE 100 MG/10ML IV SOLN
INTRAVENOUS | Status: AC
  Filled 2016-10-16: qty 1

## 2016-10-16 MED ORDER — OXYCODONE HCL 5 MG PO TABS
5.0000 mg | ORAL_TABLET | Freq: Once | ORAL | Status: AC | PRN
  Administered 2016-10-16: 5 mg via ORAL

## 2016-10-16 MED ORDER — 0.9 % SODIUM CHLORIDE (POUR BTL) OPTIME
TOPICAL | Status: DC | PRN
  Administered 2016-10-16: 2000 mL

## 2016-10-16 MED ORDER — HYDROMORPHONE HCL 1 MG/ML IJ SOLN: 0.2500 mg | INTRAMUSCULAR | Status: DC | PRN

## 2016-10-16 SURGICAL SUPPLY — 19 items
BAG URINE DRAINAGE (UROLOGICAL SUPPLIES) ×3 IMPLANT
CATH FOLEY LATEX FREE 14FR (CATHETERS) ×2
CATH FOLEY LF 14FR (CATHETERS) ×1 IMPLANT
ELECT REM PT RETURN 9FT ADLT (ELECTROSURGICAL) ×3
ELECTRODE REM PT RTRN 9FT ADLT (ELECTROSURGICAL) ×1 IMPLANT
GLOVE BIOGEL PI IND STRL 6 (GLOVE) ×3 IMPLANT
GLOVE BIOGEL PI IND STRL 6.5 (GLOVE) ×3 IMPLANT
GLOVE BIOGEL PI IND STRL 7.0 (GLOVE) ×3 IMPLANT
GLOVE BIOGEL PI INDICATOR 6 (GLOVE) ×6
GLOVE BIOGEL PI INDICATOR 6.5 (GLOVE) ×6
GLOVE BIOGEL PI INDICATOR 7.0 (GLOVE) ×6
GLOVE ECLIPSE 6.0 STRL STRAW (GLOVE) ×6 IMPLANT
GLOVE ECLIPSE 6.5 STRL STRAW (GLOVE) ×6 IMPLANT
NS IRRIG 1000ML POUR BTL (IV SOLUTION) ×6 IMPLANT
PACK VAGINAL WOMENS (CUSTOM PROCEDURE TRAY) ×3 IMPLANT
SUT PDS AB 0 CT1 27 (SUTURE) ×6 IMPLANT
SUT PDS AB 1 CT1 36 (SUTURE) ×6 IMPLANT
SYR BULB IRRIGATION 50ML (SYRINGE) ×3 IMPLANT
TRAY FOLEY BAG SILVER LF 14FR (CATHETERS) ×3 IMPLANT

## 2016-10-16 NOTE — Progress Notes (Signed)
Subjective: Called to see the patient because she had a large gush of foul smelling vaginal discharge while going to the bathroom. She reports her cramps are up to a 6/10 (from 4-5/10 this morning)  Objective: Today's Vitals   10/16/16 0600 10/16/16 0759 10/16/16 0800 10/16/16 1155  BP:  (!) 97/55  (!) 108/59  Pulse:  79  95  Resp:  17  17  Temp:  98 F (36.7 C)  98.8 F (37.1 C)  TempSrc:  Oral  Oral  SpO2:  98%  100%  Weight:      Height:      PainSc: 5   5     Lab Results  Component Value Date   WBC 11.3 (H) 10/16/2016   HGB 8.8 (L) 10/16/2016   HCT 25.6 (L) 10/16/2016   MCV 83.4 10/16/2016   PLT 361 10/16/2016     General: alert, cooperative and no distress GI: soft, tender in the suprapubic region, no rebound, no guarding, no masses Vaginal exam: mass felt at cuff, tender to palpation, cuff open in the midline, able to insert finger through cuff. Unable to tolerate exam well   Assessment/Plan: POD #12, admitted 2 days ago with pelvic abscess and spontaneous drainage. Now with cuff dehiscence  Plan: Examination under anesthesia, drainage of abscess (if able), repair of cuff Will change from Zosyn to amp/gent/clinda   LOS: 1 day    Romualdo BolkJill Evelyn Gaige Fussner 10/16/2016, 2:43 PM

## 2016-10-16 NOTE — Anesthesia Procedure Notes (Signed)
Performed by: Estera Ozier M       

## 2016-10-16 NOTE — Transfer of Care (Signed)
Immediate Anesthesia Transfer of Care Note  Patient: Kirsten Fischer  Procedure(s) Performed: Procedure(s): EXAM UNDER ANESTHESIA, REPAIR OF VAGINAL CUFF DEHISCENCE, DRAINGAGE OF PELVIC ABCESS (N/A)  Patient Location: PACU  Anesthesia Type:General  Level of Consciousness: awake, alert  and oriented  Airway & Oxygen Therapy: Patient Spontanous Breathing and Patient connected to nasal cannula oxygen  Post-op Assessment: Report given to RN and Post -op Vital signs reviewed and stable  Post vital signs: Reviewed and stable  Last Vitals:  Vitals:   10/16/16 1155 10/16/16 1507  BP: (!) 108/59 117/71  Pulse: 95 (!) 102  Resp: 17 19  Temp: 37.1 C 37.1 C    Last Pain:  Vitals:   10/16/16 1507  TempSrc: Oral  PainSc:       Patients Stated Pain Goal: 2 (10/16/16 0800)  Complications: No apparent anesthesia complications

## 2016-10-16 NOTE — Anesthesia Procedure Notes (Signed)
Procedure Name: Intubation Date/Time: 10/16/2016 4:18 PM Performed by: [employer], Sheron Nightingale Pre-anesthesia Checklist: Patient identified, Emergency Drugs available, Suction available, Patient being monitored and Timeout performed Patient Re-evaluated:Patient Re-evaluated prior to induction Oxygen Delivery Method: Circle system utilized Preoxygenation: Pre-oxygenation with 100% oxygen Induction Type: IV induction Laryngoscope Size: Mac and 3 Grade View: Grade I Tube type: Oral Tube size: 7.0 mm Number of attempts: 1 Secured at: 24 cm Tube secured with: Tape Dental Injury: Teeth and Oropharynx as per pre-operative assessment

## 2016-10-16 NOTE — Progress Notes (Signed)
Subjective: Patient reports that she has low level, constant, tolerable pelvic cramping, no change from yesterday. She rates the pain as a 4-5/10 in severity, but states that her menstrual cramps and her immediate post op pain were 10/10 in severity. She has an increased appetite today and generally feels a little better. She is ambulating, voiding, normal BM yesterday.   Objective: I have reviewed patient's vital signs, medications and labs.  General: alert, cooperative and no distress Resp: clear to auscultation bilaterally Cardio: regular rate and rhythm, S1, S2 normal, no murmur, click, rub or gallop GI: soft, moderately tender in the suprapubic region, no rebound, no guarding, no masses. NABS.  Extremities: extremities normal, atraumatic, no cyanosis or edema Vaginal Bleeding: moderate amount of dark, foul smelling discharge   Assessment/Plan: The patient is POD #12 s/p TLS/BSO for chronic pelvic pain and stage 4 endometriosis. Post op course was complicated by a post-op pelvic hematoma. She was admitted Thursday night with spontaneous drainage of a pelvic abscess. She is now s/p 5 doses of Zosyn. She has remained afebrile, WBC is slightly elevated today. She continues to drain foul smelling dark vaginal discharge. Her pain is low level and tolerable. She states that she has improved appetite and is starting to feel better.  Plan:  will continue Zosyn, it can take 48 to 72 hours to respond, the abscess is spontaneously draining  Follow vitals and exam  Recheck WBC with diff in the am  Discussed with the patient that her WBC needs to come down and her pain should improve prior to D/C  We discussed the possibility of a repeat CT scan tomorrow if she doesn't continue to improve      LOS: 1 day    Romualdo BolkJill Evelyn Jertson 10/16/2016, 10:52 AM

## 2016-10-16 NOTE — Anesthesia Postprocedure Evaluation (Signed)
Anesthesia Post Note  Patient: Karen ChafeLauren Robertson  Procedure(s) Performed: Procedure(s) (LRB): EXAM UNDER ANESTHESIA, REPAIR OF VAGINAL CUFF DEHISCENCE, DRAINGAGE OF PELVIC ABCESS (N/A)     Patient location during evaluation: PACU Anesthesia Type: General Level of consciousness: awake and alert Pain management: pain level controlled Vital Signs Assessment: post-procedure vital signs reviewed and stable Respiratory status: spontaneous breathing, nonlabored ventilation, respiratory function stable and patient connected to nasal cannula oxygen Cardiovascular status: blood pressure returned to baseline and stable Postop Assessment: no signs of nausea or vomiting Anesthetic complications: no    Last Vitals:  Vitals:   10/16/16 1815 10/16/16 1830  BP: 112/70 115/66  Pulse: 93 100  Resp: 17 16  Temp: 36.6 C 36.8 C    Last Pain:  Vitals:   10/16/16 2000  TempSrc:   PainSc: 2    Pain Goal: Patients Stated Pain Goal: 2 (10/16/16 0800)               Alycia Rossettiyan P Ellender

## 2016-10-16 NOTE — Op Note (Signed)
Preoperative Diagnosis: Pelvic abscess, vaginal cuff dehiscence  Postoperative Diagnosis: Same  Procedure: Exam under anesthesia, drainage of pelvic abscess, repair of vaginal cuff dehiscence  Surgeon: Dr Gertie ExonJill Jertson  Assistants: None  Anesthesia: General  EBL: 5 cc  Fluids: 1,100 cc LR  Urine output: 150 cc  Indications for surgery: The patient is a 31 yo female, who presented 10 days s/p TLH/BSO with drainage of foul vaginal blood/discharge. She was afebrile with a mildly elevated WBC. CT showed a pelvic abscess. She was admitted and placed on Zosyn. This afternoon she had a large gush of fluid and increasing pelvic pain. Exam at bedside was significant for a vaginal cuff dehiscence.  The risks of the surgery were reviewed with the patient and the consent form was signed prior to her surgery.  Findings: EUA: the vaginal cuff had a 2 cm defect, with gentle probing the entire cuff opened. Drainage of a moderate amount of foul smelling clot/dark fluid. Induration was noted above the cuff.   Specimens: gram stain and culture of the infected discharge   Drains: a foley catheter drain was placed in the cuff to promote continued vaginal drainage. It was inflated with 10 cc of normal saline  Procedure: The patient was taken to the operating room with an IV in place. She was placed in the dorsal lithotomy position and anesthesia was administered. She received antibiotics on the way to the OR. A speculum was placed in the vagina and a culture was taken from inside of the open vaginal cuff. She was then prepped and draped in the usual sterile fashion for a vaginal procedure. A foley catheter was placed in the bladder.  A speculum was placed in the vagina and the vaginal cuff was copiously irrigated with normal saline. A large amount of brown foul smelling d/c and small clots were removed with gentle irrigation and suctioning. A several cm portion of barbed suture was seen coming from one end of  the vaginal cuff, it was removed. The necrotic tissue on the edge of the cuff was removed sharply. The cuff was then closed with interrupted sutures of 0 PDS. Prior to placing the last 2 sutures a #14 foley catheter was placed into the abscess cavity, it was inflated with 10 cc of normal saline. The last 2 stitches were placed taking care not to stitch into the catheter.    Upon awakening the patient was extubated and transferred to the recovery room in stable and awake condition.  The sponge and instrument count were correct. There were no complications.    CC: Dr Leda QuailSuzanne Miller

## 2016-10-16 NOTE — Anesthesia Preprocedure Evaluation (Addendum)
Anesthesia Evaluation  Patient identified by MRN, date of birth, ID band Patient awake    Reviewed: Allergy & Precautions, NPO status , Patient's Chart, lab work & pertinent test results  History of Anesthesia Complications (+) PONV and history of anesthetic complications  Airway Mallampati: I  TM Distance: >3 FB Neck ROM: Full    Dental  (+) Teeth Intact, Dental Advisory Given,    Pulmonary neg shortness of breath, asthma , neg sleep apnea, neg recent URI, former smoker,    breath sounds clear to auscultation       Cardiovascular negative cardio ROS   Rhythm:Regular     Neuro/Psych PSYCHIATRIC DISORDERS negative neurological ROS     GI/Hepatic negative GI ROS, Neg liver ROS, GERD  Medicated and Controlled,  Endo/Other  negative endocrine ROS  Renal/GU negative Renal ROS     Musculoskeletal   Abdominal   Peds  Hematology  (+) anemia ,   Anesthesia Other Findings   Reproductive/Obstetrics                            Anesthesia Physical  Anesthesia Plan  ASA: II  Anesthesia Plan: General   Post-op Pain Management:    Induction: Intravenous  PONV Risk Score and Plan: 4 or greater and Ondansetron, Dexamethasone, Propofol and Midazolam  Airway Management Planned: Oral ETT  Additional Equipment:   Intra-op Plan:   Post-operative Plan: Extubation in OR  Informed Consent: I have reviewed the patients History and Physical, chart, labs and discussed the procedure including the risks, benefits and alternatives for the proposed anesthesia with the patient or authorized representative who has indicated his/her understanding and acceptance.   Dental advisory given  Plan Discussed with: CRNA and Surgeon  Anesthesia Plan Comments:         Anesthesia Quick Evaluation

## 2016-10-17 ENCOUNTER — Encounter (HOSPITAL_COMMUNITY): Payer: Self-pay | Admitting: Obstetrics and Gynecology

## 2016-10-17 LAB — CBC WITH DIFFERENTIAL/PLATELET
BASOS ABS: 0 10*3/uL (ref 0.0–0.1)
BASOS PCT: 0 %
Basophils Absolute: 0 10*3/uL (ref 0.0–0.1)
Basophils Relative: 0 %
EOS ABS: 0.1 10*3/uL (ref 0.0–0.7)
EOS ABS: 0.1 10*3/uL (ref 0.0–0.7)
EOS PCT: 1 %
Eosinophils Relative: 1 %
HCT: 27.2 % — ABNORMAL LOW (ref 36.0–46.0)
HEMATOCRIT: 23.3 % — AB (ref 36.0–46.0)
HEMOGLOBIN: 8.1 g/dL — AB (ref 12.0–15.0)
Hemoglobin: 9.1 g/dL — ABNORMAL LOW (ref 12.0–15.0)
LYMPHS ABS: 1.8 10*3/uL (ref 0.7–4.0)
LYMPHS PCT: 21 %
Lymphocytes Relative: 19 %
Lymphs Abs: 1.9 10*3/uL (ref 0.7–4.0)
MCH: 29.1 pg (ref 26.0–34.0)
MCH: 28.2 pg (ref 26.0–34.0)
MCHC: 34.8 g/dL (ref 30.0–36.0)
MCHC: 33.5 g/dL (ref 30.0–36.0)
MCV: 83.8 fL (ref 78.0–100.0)
MCV: 84.2 fL (ref 78.0–100.0)
MONO ABS: 0.4 10*3/uL (ref 0.1–1.0)
MONOS PCT: 4 %
Monocytes Absolute: 0.4 10*3/uL (ref 0.1–1.0)
Monocytes Relative: 4 %
NEUTROS ABS: 7.4 10*3/uL (ref 1.7–7.7)
NEUTROS PCT: 76 %
Neutro Abs: 6.4 10*3/uL (ref 1.7–7.7)
Neutrophils Relative %: 74 %
PLATELETS: 446 10*3/uL — AB (ref 150–400)
Platelets: 366 10*3/uL (ref 150–400)
RBC: 2.78 MIL/uL — AB (ref 3.87–5.11)
RBC: 3.23 MIL/uL — ABNORMAL LOW (ref 3.87–5.11)
RDW: 12.7 % (ref 11.5–15.5)
RDW: 12.7 % (ref 11.5–15.5)
WBC: 9.7 10*3/uL (ref 4.0–10.5)
WBC: 8.7 10*3/uL (ref 4.0–10.5)

## 2016-10-17 MED ORDER — SENNA 8.6 MG PO TABS
1.0000 | ORAL_TABLET | Freq: Two times a day (BID) | ORAL | Status: DC | PRN
  Administered 2016-10-17: 8.6 mg via ORAL
  Filled 2016-10-17 (×2): qty 1

## 2016-10-17 NOTE — Anesthesia Postprocedure Evaluation (Signed)
Anesthesia Post Note  Patient: Kirsten Fischer  Procedure(s) Performed: Procedure(s) (LRB): EXAM UNDER ANESTHESIA, REPAIR OF VAGINAL CUFF DEHISCENCE, DRAINGAGE OF PELVIC ABCESS (N/A)     Patient location during evaluation: [facility] Anesthesia Type: General Level of consciousness: awake and alert and oriented Pain management: pain level controlled Vital Signs Assessment: post-procedure vital signs reviewed and stable Respiratory status: spontaneous breathing and nonlabored ventilation Cardiovascular status: stable Postop Assessment: no signs of nausea or vomiting and adequate PO intake Anesthetic complications: no    Last Vitals:  Vitals:   10/16/16 2320 10/17/16 0415  BP: 112/63 (!) 100/56  Pulse: 98 84  Resp: 18 18  Temp: 37.1 C 36.8 C    Last Pain:  Vitals:   10/17/16 0557  TempSrc:   PainSc: 7    Pain Goal: Patients Stated Pain Goal: 4 (10/17/16 0557)               Laban EmperorMalinova,Elester Apodaca Hristova

## 2016-10-17 NOTE — Progress Notes (Signed)
Subjective: Patient reports that she is feeling better, no longer having baseline pain, having intermittent crampy lower abdominal pain 2/10 (with pain meds, down from 6/10 yesterday afternoon on pain meds). She thinks some of her pain may be gas pains. Last BM was 2 days ago. She is "starving" this morning. Ambulating and voiding fine. She does have intermittent discomfort from the vaginal drain.   Objective: I have reviewed patient's vital signs, intake and output and labs. She has remained afebrile  Today's Vitals   10/17/16 0415 10/17/16 0557 10/17/16 0800 10/17/16 0826  BP: (!) 100/56   (!) 91/50  Pulse: 84   77  Resp: 18   18  Temp: 98.3 F (36.8 C)   98.4 F (36.9 C)  TempSrc: Oral   Oral  SpO2: 99%   99%  Weight:      Height:      PainSc:  7  3       General: alert, cooperative and no distress Resp: clear to auscultation bilaterally Cardio: regular rate and rhythm, S1, S2 normal, no murmur, click, rub or gallop GI: soft, minimally tender in the LLQ, exam improved from yesterday. +BS Extremities: extremities normal, atraumatic, no cyanosis or edema Vaginal Bleeding: minimal  Vaginal drain: small amount of brownish/red discharge. No blood on her pad  CBC Latest Ref Rng & Units 10/17/2016 10/16/2016 10/16/2016  WBC 4.0 - 10.5 K/uL 9.7 16.3(H) 11.3(H)  Hemoglobin 12.0 - 15.0 g/dL 8.1(L) 9.4(L) 8.8(L)  Hematocrit 36.0 - 46.0 % 23.3(L) 27.9(L) 25.6(L)  Platelets 150 - 400 K/uL 366 399 361   I/O last 3 completed shifts: In: 3765 [P.O.:840; I.V.:2875; IV Piggyback:50] Out: 4080 [Urine:4075; Blood:5] No intake/output data recorded.     Assessment/Plan: 13 days post op s/p TLH/BSO for chronic pelvic pain and stage 4 endometriosis. She was admitted Thursday night with an infected pelvic hematoma/pelvic abscess. Initially treated with Zosyn. Yesterday she had a cuff dehiscence and is now POD #1 s/p EUA, drainage of pelvic abscess and repair of vaginal cuff dehiscence. She is  feeling better this morning. Her pain has improved, her WBC is back to normal, less tender on exam. Her HGB is down slightly from yesterday morning, no clinical signs of bleeding. Vitals and urine output are normal.  New constipation.  Plan: Continue amp/gent/clinda Will leave vaginal drain in place, continues to drain a small amount Senna for constipation Will check a CBC in the am Ambulate If she continues to improve, possible d/c tomorrow on Augmentin   LOS: 2 days    Kirsten Fischer 10/17/2016, 10:03 AM

## 2016-10-17 NOTE — Addendum Note (Signed)
Addendum  created 10/17/16 0759 by Elgie CongoMalinova, Alayha Babineaux H, CRNA   Sign clinical note

## 2016-10-18 ENCOUNTER — Inpatient Hospital Stay (HOSPITAL_COMMUNITY): Payer: PRIVATE HEALTH INSURANCE

## 2016-10-18 LAB — CBC WITH DIFFERENTIAL/PLATELET
BASOS ABS: 0 10*3/uL (ref 0.0–0.1)
BASOS ABS: 0 10*3/uL (ref 0.0–0.1)
BASOS PCT: 0 %
Basophils Relative: 0 %
EOS ABS: 0.2 10*3/uL (ref 0.0–0.7)
EOS PCT: 2 %
Eosinophils Absolute: 0.1 10*3/uL (ref 0.0–0.7)
Eosinophils Relative: 2 %
HCT: 23.1 % — ABNORMAL LOW (ref 36.0–46.0)
HEMATOCRIT: 28.2 % — AB (ref 36.0–46.0)
Hemoglobin: 8 g/dL — ABNORMAL LOW (ref 12.0–15.0)
Hemoglobin: 9.5 g/dL — ABNORMAL LOW (ref 12.0–15.0)
LYMPHS PCT: 26 %
LYMPHS PCT: 24 %
Lymphs Abs: 1.9 10*3/uL (ref 0.7–4.0)
Lymphs Abs: 1.7 10*3/uL (ref 0.7–4.0)
MCH: 28.5 pg (ref 26.0–34.0)
MCH: 28.2 pg (ref 26.0–34.0)
MCHC: 34.6 g/dL (ref 30.0–36.0)
MCHC: 33.7 g/dL (ref 30.0–36.0)
MCV: 82.2 fL (ref 78.0–100.0)
MCV: 83.7 fL (ref 78.0–100.0)
MONOS PCT: 1 %
Monocytes Absolute: 0.4 10*3/uL (ref 0.1–1.0)
Monocytes Absolute: 0.1 10*3/uL (ref 0.1–1.0)
Monocytes Relative: 5 %
NEUTROS PCT: 73 %
Neutro Abs: 4.7 10*3/uL (ref 1.7–7.7)
Neutro Abs: 5.3 10*3/uL (ref 1.7–7.7)
Neutrophils Relative %: 67 %
PLATELETS: 405 10*3/uL — AB (ref 150–400)
Platelets: 527 10*3/uL — ABNORMAL HIGH (ref 150–400)
RBC: 2.81 MIL/uL — AB (ref 3.87–5.11)
RBC: 3.37 MIL/uL — AB (ref 3.87–5.11)
RDW: 12.7 % (ref 11.5–15.5)
RDW: 12.9 % (ref 11.5–15.5)
WBC: 7.1 10*3/uL (ref 4.0–10.5)
WBC: 7.2 10*3/uL (ref 4.0–10.5)

## 2016-10-18 LAB — RETICULOCYTES
RBC.: 3.37 MIL/uL — AB (ref 3.87–5.11)
RETIC COUNT ABSOLUTE: 60.7 10*3/uL (ref 19.0–186.0)
Retic Ct Pct: 1.8 % (ref 0.4–3.1)

## 2016-10-18 LAB — FERRITIN: FERRITIN: 235 ng/mL (ref 11–307)

## 2016-10-18 MED ORDER — DOCUSATE SODIUM 100 MG PO CAPS: 100.0000 mg | ORAL_CAPSULE | Freq: Two times a day (BID) | ORAL | 0 refills | Status: DC | PRN

## 2016-10-18 MED ORDER — IOPAMIDOL (ISOVUE-300) INJECTION 61%
30.0000 mL | Freq: Once | INTRAVENOUS | Status: AC | PRN
  Administered 2016-10-18: 30 mL via ORAL

## 2016-10-18 MED ORDER — IOPAMIDOL (ISOVUE-300) INJECTION 61%
100.0000 mL | Freq: Once | INTRAVENOUS | Status: AC | PRN
  Administered 2016-10-18: 100 mL via INTRAVENOUS

## 2016-10-18 MED ORDER — SENNA 8.6 MG PO TABS: 1.0000 | ORAL_TABLET | Freq: Two times a day (BID) | ORAL | 0 refills | Status: DC | PRN

## 2016-10-18 MED ORDER — FLUCONAZOLE 150 MG PO TABS: 150.0000 mg | ORAL_TABLET | Freq: Once | ORAL | 0 refills | Status: AC

## 2016-10-18 MED ORDER — AMOXICILLIN-POT CLAVULANATE 875-125 MG PO TABS: 1.0000 | ORAL_TABLET | Freq: Two times a day (BID) | ORAL | 0 refills | Status: DC

## 2016-10-18 MED ORDER — IBUPROFEN 800 MG PO TABS: 800.0000 mg | ORAL_TABLET | Freq: Three times a day (TID) | ORAL | 1 refills | Status: DC | PRN

## 2016-10-18 NOTE — Progress Notes (Signed)
Patient discharged home with husband. Discharge prescriptions, home care, instructions, and follow-up appts reviewed. No questions at this time.

## 2016-10-18 NOTE — Progress Notes (Signed)
Subjective: Patient reports that she is feeling good. Much better since the drain was removed. She also had a bowel movement and that helped as well. Eager to go home.     Objective: I have reviewed patient's vital signs, labs and radiology results.  General: alert, cooperative and no distress GI: soft, minimally tender in the SP region, no rebound, no gaurding, no masses.    CT-scan of the pelvis showed decreasing size of her pelvic abscess CBC Latest Ref Rng & Units 10/18/2016 10/18/2016 10/17/2016  WBC 4.0 - 10.5 K/uL 7.2 7.1 8.7  Hemoglobin 12.0 - 15.0 g/dL 4.0(J9.5(L) 8.1(X8.0(L) 9.1(Y9.1(L)  Hematocrit 36.0 - 46.0 % 28.2(L) 23.1(L) 27.2(L)  Platelets 150 - 400 K/uL 527(H) 405(H) 446(H)    Today's Vitals   10/18/16 0839 10/18/16 1003 10/18/16 1240 10/18/16 1620  BP: 100/64  115/74 (!) 102/59  Pulse: 76  75 79  Resp: 16  16 18   Temp: 98.3 F (36.8 C)  98.2 F (36.8 C) 99.1 F (37.3 C)  TempSrc: Oral  Oral Oral  SpO2: 99%  100% 100%  Weight:      Height:      PainSc: 3  0-No pain       Assessment/Plan: POD #14 s/p TLH/BSO POD #2 s/p EUA, drainage of pelvic abscess, repair of vaginal cuff dehiscence.  She has always remained afebrile, her WBC is now normal, exam markedly improved and CT shows improvement She had minimal drainage from her vaginal cuff drain, it was removed a few hours ago and she is feeling good.  Will d/c to home on Augmentin F/U in the office in 2 days Call with any concerns  LOS: 3 days    Romualdo BolkJill Evelyn Avaleigh Decuir 10/18/2016, 5:37 PM

## 2016-10-18 NOTE — Progress Notes (Signed)
Subjective: Patient reports she is feeling better, feel like she needs to have a BM, but the vaginal drain causes pain when she tries to strain. Her overall pain is markedly improved. She thinks her pain now is from the vaginal drain and constipation. She slept all night without pain meds. Tolerating po. Voiding well (no longer using the hat). She wants the drain out.    Objective: I have reviewed patient's vital signs and labs.  Today's Vitals   10/18/16 0010 10/18/16 0014 10/18/16 0100 10/18/16 0405  BP:  117/64  (!) 99/51  Pulse:  78  78  Resp:  18  17  Temp:  99.1 F (37.3 C)  98.2 F (36.8 C)  TempSrc:  Oral  Oral  SpO2:  99%  99%  Weight:      Height:      PainSc: 6   2       General: alert, cooperative and no distress Resp: clear to auscultation bilaterally Cardio: regular rate and rhythm, S1, S2 normal, no murmur, click, rub or gallop GI: soft, minimally tender in the suprapubic area (she states more that it feels sensitive than tender). +BS Extremities: extremities normal, atraumatic, no cyanosis or edema  Lab Results  Component Value Date   WBC 7.1 10/18/2016   HGB 8.0 (L) 10/18/2016   HCT 23.1 (L) 10/18/2016   MCV 82.2 10/18/2016   PLT 405 (H) 10/18/2016   CBC Latest Ref Rng & Units 10/18/2016 10/17/2016 10/17/2016  WBC 4.0 - 10.5 K/uL 7.1 8.7 9.7  Hemoglobin 12.0 - 15.0 g/dL 8.0(L) 9.1(L) 8.1(L)  Hematocrit 36.0 - 46.0 % 23.1(L) 27.2(L) 23.3(L)  Platelets 150 - 400 K/uL 405(H) 446(H) 366   Pelvic drain: 20 cc in the last 24 hours, only 5 cc in the last 13 hours  Assessment/Plan: 1) 14 days post op s/p TLH/BSO, admitted Thursday night with a pelvic abscess. POD #2 s/p EUA, drainage of pelvic abscess and repair of vaginal cuff dehiscence. She has a foley catheter draining above the vaginal cuff closure. Minimal drainage in the last 13 hours. She is on triple antibiotics, now with normalization of her WBC. Plan:  Will repeat pelvic CT  Will likely remove drain  later today  Anticipate sending her home on oral antibiotics (culture results no growth at <24 hours, final results pending) 2)Constipation, discussed option of an enema, feels she needs to go, but, she has discomfort when she tries to strain 3)Anemia  Stable hgb from yesterday morning (it was up to 9.1 last night)  Will check CBC, ferritin, retic count this afternoon   LOS: 3 days    Romualdo BolkJill Evelyn Jertson 10/18/2016, 8:01 AM

## 2016-10-19 NOTE — Discharge Summary (Signed)
Physician Discharge Summary   Patient ID: Kirsten Fischer [id number] y.o. 1985-05-22  Admit date: 10/14/2016  Discharge date and time: 10/18/2016  6:53 PM   Admitting Physician: Jerene Bears, MD   Discharge Physician: Gertie Exon, MD  Admission Diagnoses: post hysterectomy pelvic abscess  Discharge Diagnoses: post hysterectomy pelvic abscess, vaginal cuff dehiscence   Admission Condition: stable  Discharged Condition: good  Indication for Admission: vaginal bleeding, pelvic abscess  Hospital Course: the patient was admitted after CT scan showed a pelvic abscess. She was started on IV Zosyn. On 10/16/16 she had a vaginal cuff dehiscence and underwent surgical repair (please see op note). She was changed from Zosyn to ampicillin/gentamycin/clindamycin. Her WBC, CT scan and pain improved. She was discharged to home on 7/16 on oral antibiotics  Consults: None  Significant Diagnostic Studies: CT scans  Treatments: IV hydration, antibiotics: Zosyn, Cipro, gentamycin and ampicillin and surgery: drainage of pelvic abscess, vaginal cuff repair  Discharge Exam: BP (!) 102/59 (BP Location: Right Arm)   Pulse 79   Temp 99.1 F (37.3 C) (Oral)   Resp 18   Ht 5\' 8"  (1.727 m)   Wt 148 lb (67.1 kg)   LMP 07/03/2013   SpO2 100%   BMI 22.50 kg/m   General Appearance:    Alert, cooperative, no distress, appears stated age  Head:    Normocephalic, without obvious abnormality, atraumatic  Eyes:    PERRL, conjunctiva/corneas clear, EOM's intact, fundi    benign, both eyes  Ears:    Normal TM's and external ear canals, both ears  Nose:   Nares normal, septum midline, mucosa normal, no drainage    or sinus tenderness  Throat:   Lips, mucosa, and tongue normal; teeth and gums normal  Neck:   Supple, symmetrical, trachea midline, no adenopathy;    thyroid:  no enlargement/tenderness/nodules; no carotid   bruit or JVD  Back:     Symmetric, no curvature, ROM normal, no CVA tenderness   Lungs:     Clear to auscultation bilaterally, respirations unlabored  Chest Wall:    No tenderness or deformity   Heart:    Regular rate and rhythm, S1 and S2 normal, no murmur, rub   or gallop  Breast Exam:    No tenderness, masses, or nipple abnormality  Abdomen:     Soft, non-tender, bowel sounds active all four quadrants,    no masses, no organomegaly  Genitalia:    Normal female without lesion, discharge or tenderness  Rectal:    Normal tone, normal prostate, no masses or tenderness;   guaiac negative stool  Extremities:   Extremities normal, atraumatic, no cyanosis or edema  Pulses:   2+ and symmetric all extremities  Skin:   Skin color, texture, turgor normal, no rashes or lesions  Lymph nodes:   Cervical, supraclavicular, and axillary nodes normal  Neurologic:   CNII-XII intact, normal strength, sensation and reflexes    throughout    Disposition: 01-Home or Self Care  Patient Instructions:  Allergies as of 10/18/2016      Reactions   Nitrofurantoin Hives      Medication List    STOP taking these medications   amphetamine-dextroamphetamine 5 MG tablet Commonly known as:  ADDERALL   lisdexamfetamine 30 MG capsule Commonly known as:  VYVANSE   sulfamethoxazole-trimethoprim 800-160 MG tablet Commonly known as:  BACTRIM DS,SEPTRA DS     TAKE these medications   albuterol 108 (90 Base) MCG/ACT inhaler Commonly known as:  PROVENTIL  HFA;VENTOLIN HFA Inhale 1-2 puffs into the lungs every 6 (six) hours as needed for wheezing or shortness of breath.   albuterol (2.5 MG/3ML) 0.083% nebulizer solution Commonly known as:  PROVENTIL Take 2.5 mg by nebulization every 4 (four) hours as needed.   alum & mag hydroxide-simeth 200-200-20 MG/5ML suspension Commonly known as:  MAALOX/MYLANTA Take 30 mLs by mouth every 4 (four) hours as needed for indigestion.   amoxicillin-clavulanate 875-125 MG tablet Commonly known as:  AUGMENTIN Take 1 tablet by mouth 2 (two) times daily.    BENADRYL PO Take 25 mg by mouth at bedtime. As needed   docusate sodium 100 MG capsule Commonly known as:  COLACE Take 1 capsule (100 mg total) by mouth 2 (two) times daily as needed for mild constipation.   ibuprofen 800 MG tablet Commonly known as:  ADVIL,MOTRIN Take 1 tablet (800 mg total) by mouth every 8 (eight) hours as needed.   montelukast 10 MG tablet Commonly known as:  SINGULAIR Take 1 tablet (10 mg total) by mouth at bedtime.   norethindrone 5 MG tablet Commonly known as:  AYGESTIN Take 1 tablet (5 mg total) by mouth daily.   oxyCODONE-acetaminophen 5-325 MG tablet Commonly known as:  PERCOCET Take 1-2 tablets by mouth every 6 (six) hours as needed. use only as much as needed to relieve pain   senna 8.6 MG Tabs tablet Commonly known as:  SENOKOT Take 1 tablet (8.6 mg total) by mouth every 12 (twelve) hours as needed for moderate constipation.     ASK your doctor about these medications   fluconazole 150 MG tablet Commonly known as:  DIFLUCAN Take 1 tablet (150 mg total) by mouth once. Take one tablet.  Repeat every 72 hours x 2 if symptoms are not completely resolved. Ask about: Should I take this medication?      Activity: activity as tolerated, no sex for 12 weeks, no driving while on analgesics and increase activity as tolerated Diet: regular diet Wound Care: none needed  Follow-up with Dr Oscar LaJertson in 2 days.  Signed: Romualdo BolkJill Evelyn Mayuri Staples 10/19/2016 6:13 PM

## 2016-10-20 ENCOUNTER — Encounter: Payer: Self-pay | Admitting: Obstetrics and Gynecology

## 2016-10-20 ENCOUNTER — Ambulatory Visit (INDEPENDENT_AMBULATORY_CARE_PROVIDER_SITE_OTHER): Payer: PRIVATE HEALTH INSURANCE | Admitting: Obstetrics and Gynecology

## 2016-10-20 VITALS — BP 118/70 | HR 84 | Temp 98.3°F | Resp 16 | Ht 68.0 in | Wt 149.0 lb

## 2016-10-20 DIAGNOSIS — N739 Female pelvic inflammatory disease, unspecified: Secondary | ICD-10-CM

## 2016-10-20 DIAGNOSIS — Z90710 Acquired absence of both cervix and uterus: Secondary | ICD-10-CM

## 2016-10-20 DIAGNOSIS — N939 Abnormal uterine and vaginal bleeding, unspecified: Secondary | ICD-10-CM

## 2016-10-20 LAB — CBC WITH DIFFERENTIAL/PLATELET
BASOS: 2 %
Basophils Absolute: 0.1 10*3/uL (ref 0.0–0.2)
EOS (ABSOLUTE): 0.1 10*3/uL (ref 0.0–0.4)
Eos: 2 %
Hematocrit: 28.7 % — ABNORMAL LOW (ref 34.0–46.6)
Hemoglobin: 9.6 g/dL — ABNORMAL LOW (ref 11.1–15.9)
LYMPHS ABS: 2 10*3/uL (ref 0.7–3.1)
Lymphs: 28 %
MCH: 27.2 pg (ref 26.6–33.0)
MCHC: 33.4 g/dL (ref 31.5–35.7)
MCV: 81 fL (ref 79–97)
MONOS ABS: 0.4 10*3/uL (ref 0.1–0.9)
Monocytes: 5 %
NEUTROS ABS: 4.3 10*3/uL (ref 1.4–7.0)
Neutrophils: 58 %
PLATELETS: 683 10*3/uL — AB (ref 150–379)
RBC: 3.53 x10E6/uL — ABNORMAL LOW (ref 3.77–5.28)
RDW: 14.7 % (ref 12.3–15.4)
WBC: 7.1 10*3/uL (ref 3.4–10.8)

## 2016-10-20 LAB — AEROBIC CULTURE  (SUPERFICIAL SPECIMEN)

## 2016-10-20 LAB — IMMATURE CELLS
Bands(Auto) Relative: 3 %
MYELOCYTES: 2 % — ABNORMAL HIGH (ref 0–0)

## 2016-10-20 LAB — AEROBIC CULTURE W GRAM STAIN (SUPERFICIAL SPECIMEN): Culture: NO GROWTH

## 2016-10-20 NOTE — Progress Notes (Signed)
GYNECOLOGY  VISIT   HPI: 31 y.o.   Married  Caucasian  female   [id number] with Patient's last menstrual period was 07/03/2013.   here for post op. She is feeling great! Not having pain, no bowel or bladder c/o. No fevers. No vaginal d/c or bleeding.     GYNECOLOGIC HISTORY: Patient's last menstrual period was 07/03/2013. Contraception: hysterectomy  Menopausal hormone therapy: none        OB History    Gravida Para Term Preterm AB Living   4 3 3  0 1 3   SAB TAB Ectopic Multiple Live Births   1 0 0 0 3         Patient Active Problem List   Diagnosis Date Noted  . Pelvic infection in female 10/15/2016  . Elevated white blood cell count 10/15/2016  . Endometriosis 10/04/2016  . Dysplasia of cervix, low grade (CIN 1) 09/26/2016  . Pelvic pain 09/26/2016  . Mild intermittent asthma 10/13/2015  . ADHD (attention deficit hyperactivity disorder), combined type 06/18/2015  . Frequent UTI 06/11/2015  . S/P endometrial ablation 07/10/2013  . History of biliary T-tube placement 07/10/2013    Past Medical History:  Diagnosis Date  . Asthma   . Complication of anesthesia   . GERD (gastroesophageal reflux disease)   . History of kidney infection during pregnancy   . PONV (postoperative nausea and vomiting)     Past Surgical History:  Procedure Laterality Date  . CYSTOSCOPY N/A 10/04/2016   Procedure: CYSTOSCOPY;  Surgeon: Jerene Bears, MD;  [facility];  Service: Gynecology;  Laterality: N/A;  . ENDOMETRIAL ABLATION    . fatty tumor removed    . MANDIBLE FRACTURE SURGERY    . REPAIR VAGINAL CUFF N/A 10/16/2016   Procedure: EXAM UNDER ANESTHESIA, REPAIR OF VAGINAL CUFF DEHISCENCE, DRAINGAGE OF PELVIC ABCESS;  Surgeon: Romualdo Bolk, MD;  Location: [facility];  Service: Gynecology;  Laterality: N/A;  . TOTAL LAPAROSCOPIC HYSTERECTOMY WITH SALPINGECTOMY N/A 10/04/2016   Procedure: HYSTERECTOMY TOTAL LAPAROSCOPIC WITH BILATERAL SALPINGO-OOPHORECTOMY AND  CYSTOSCOPY PERITONEAL BIOPSY;  Surgeon: Jerene Bears, MD;  Location: [facility];  Service: Gynecology;  Laterality: N/A;  . WISDOM TOOTH EXTRACTION  12 years ago    Current Outpatient Prescriptions  Medication Sig Dispense Refill  . albuterol (PROVENTIL HFA;VENTOLIN HFA) 108 (90 Base) MCG/ACT inhaler Inhale 1-2 puffs into the lungs every 6 (six) hours as needed for wheezing or shortness of breath.    Marland Kitchen albuterol (PROVENTIL) (2.5 MG/3ML) 0.083% nebulizer solution Take 2.5 mg by nebulization every 4 (four) hours as needed.     Marland Kitchen amoxicillin-clavulanate (AUGMENTIN) 875-125 MG tablet Take 1 tablet by mouth 2 (two) times daily. 20 tablet 0  . DiphenhydrAMINE HCl (BENADRYL PO) Take 25 mg by mouth at bedtime. As needed     . docusate sodium (COLACE) 100 MG capsule Take 1 capsule (100 mg total) by mouth 2 (two) times daily as needed for mild constipation. 60 capsule 0  . ibuprofen (ADVIL,MOTRIN) 800 MG tablet Take 1 tablet (800 mg total) by mouth every 8 (eight) hours as needed. 30 tablet 1  . montelukast (SINGULAIR) 10 MG tablet Take 1 tablet (10 mg total) by mouth at bedtime. 90 tablet 4  . norethindrone (AYGESTIN) 5 MG tablet Take 1 tablet (5 mg total) by mouth daily. 30 tablet 0  . alum & mag hydroxide-simeth (MAALOX/MYLANTA) 200-200-20 MG/5ML suspension Take 30 mLs by mouth every 4 (four) hours as needed for indigestion. (  Patient not taking: Reported on 10/20/2016) 355 mL 0  . amphetamine-dextroamphetamine (ADDERALL) 5 MG tablet Take 5 mg by mouth daily.    Marland Kitchen. oxyCODONE-acetaminophen (PERCOCET) 5-325 MG tablet Take 1-2 tablets by mouth every 6 (six) hours as needed. use only as much as needed to relieve pain (Patient not taking: Reported on 10/20/2016) 30 tablet 0  . senna (SENOKOT) 8.6 MG TABS tablet Take 1 tablet (8.6 mg total) by mouth every 12 (twelve) hours as needed for moderate constipation. (Patient not taking: Reported on 10/20/2016) 60 each 0  . VYVANSE 30 MG capsule Take 1  capsule by mouth daily.  0   No current facility-administered medications for this visit.      ALLERGIES: Nitrofurantoin  Family History  Problem Relation Age of Onset  . Hypertension Father   . Asthma Sister   . Diabetes Paternal Grandfather   . Diabetes Maternal Grandmother   . Breast cancer Maternal Grandmother   . Breast cancer Mother [phone]  . Lymphoma Paternal Grandmother        Non Hodgskins Lymphoma  . Anesthesia problems Neg Hx   . Hypotension Neg Hx   . Malignant hyperthermia Neg Hx   . Pseudochol deficiency Neg Hx     Social History   Social History  . Marital status: Married    Spouse name: N/A  . Number of children: N/A  . Years of education: N/A   Occupational History  . Not on file.   Social History Main Topics  . Smoking status: Former Smoker    Types: Cigarettes    Quit date: 04/20/2013  . Smokeless tobacco: Never Used  . Alcohol use 0.0 oz/week     Comment: occas   . Drug use: No  . Sexual activity: Not Currently    Partners: Male    Birth control/ protection: Other-see comments     Comment: vasectomy    Other Topics Concern  . Not on file   Social History Narrative  . No narrative on file    Review of Systems  Constitutional: Negative.   HENT: Negative.   Eyes: Negative.   Respiratory: Negative.   Cardiovascular: Negative.   Gastrointestinal: Negative.   Genitourinary: Negative.   Musculoskeletal: Negative.   Skin: Negative.   Neurological: Negative.   Endo/Heme/Allergies: Negative.   Psychiatric/Behavioral: Negative.     PHYSICAL EXAMINATION:    BP 118/70 (BP Location: Right Arm, Patient Position: Sitting, Cuff Size: Normal)   Pulse 84   Temp 98.3 F (36.8 C) (Oral)   Resp 16   Ht 5\' 8"  (1.727 m)   Wt 149 lb (67.6 kg)   LMP 07/03/2013   BMI 22.66 kg/m     General appearance: alert, cooperative and appears stated age Abdomen: soft, non-tender; non-distended; no masses,  no organomegaly Incisions: healing  well   ASSESSMENT 15 days post op s/p TLH/BSO for stage 4 endometriosis POD #4 s/p drainage of pelvic abscess and repair of vaginal cuff dehiscence  She is feeling great, normal exam    PLAN Continue Augmentin Call with any concerns F/U with Dr Hyacinth MeekerMiller on Tuesday   An After Visit Summary was printed and given to the patient.  Addendum: a few hours after her visit the patient called c/o vaginal bleeding. She returned for repeat evaluation. She was sweeping the floors and could feel herself bleeding. No odor, no pain.    Pelvic: External genitalia:  no lesions  Urethra:  normal appearing urethra with no masses, tenderness or lesions              Bartholins and Skenes: normal                 Vagina: normal appearing vagina with a small amount of brown blood in her vagina, no odor  Vaginal pad approximately 1/4 full with brown blood              Cervix: absent  Vaginal cuff: intact, sutures all seen, no active bleeding              Bimanual Exam:  Induration is markedly improved, not tender to palpation  A/P Bleeding s/p repair of vaginal cuff dehiscence from pelvic abscess. She clinically looks great, no active bleeding, cuff is intact, she feels fine. Plan: CBC with diff   She needs to take it easy, no housework, no lifting  F/U in the morning for repeat exam  She will call me if she has any changes before the am             Chaperone was present for exam.  CC: Dr Hyacinth Meeker

## 2016-10-21 ENCOUNTER — Ambulatory Visit (INDEPENDENT_AMBULATORY_CARE_PROVIDER_SITE_OTHER): Payer: PRIVATE HEALTH INSURANCE | Admitting: Obstetrics and Gynecology

## 2016-10-21 ENCOUNTER — Encounter: Payer: Self-pay | Admitting: Obstetrics and Gynecology

## 2016-10-21 VITALS — BP 100/58 | HR 80 | Resp 14 | Ht 68.0 in | Wt 149.0 lb

## 2016-10-21 DIAGNOSIS — Z90710 Acquired absence of both cervix and uterus: Secondary | ICD-10-CM

## 2016-10-21 DIAGNOSIS — N739 Female pelvic inflammatory disease, unspecified: Secondary | ICD-10-CM

## 2016-10-21 NOTE — Progress Notes (Signed)
GYNECOLOGY  VISIT   HPI: 31 y.o.   Married  Caucasian  female   [phone] with Patient's last menstrual period was 07/03/2013.   here for post op bleeding f/u. Last night she had a BM and strained and her bleeding picked up some, all dark, no odor, no pain as long as she takes her ibuprofen every 12 hours (no change). No fevers.   GYNECOLOGIC HISTORY: Patient's last menstrual period was 07/03/2013. Contraception: hysterectomy  Menopausal hormone therapy: none        OB History    Gravida Para Term Preterm AB Living   4 3 3  0 1 3   SAB TAB Ectopic Multiple Live Births   1 0 0 0 3         Patient Active Problem List   Diagnosis Date Noted  . Pelvic infection in female 10/15/2016  . Elevated white blood cell count 10/15/2016  . Endometriosis 10/04/2016  . Dysplasia of cervix, low grade (CIN 1) 09/26/2016  . Pelvic pain 09/26/2016  . Mild intermittent asthma 10/13/2015  . ADHD (attention deficit hyperactivity disorder), combined type 06/18/2015  . Frequent UTI 06/11/2015  . S/P endometrial ablation 07/10/2013  . History of biliary T-tube placement 07/10/2013    Past Medical History:  Diagnosis Date  . Asthma   . Complication of anesthesia   . GERD (gastroesophageal reflux disease)   . History of kidney infection during pregnancy   . PONV (postoperative nausea and vomiting)     Past Surgical History:  Procedure Laterality Date  . CYSTOSCOPY N/A 10/04/2016   Procedure: CYSTOSCOPY;  Surgeon: Jerene BearsMiller, Mary S, MD;  [facility];  Service: Gynecology;  Laterality: N/A;  . ENDOMETRIAL ABLATION    . fatty tumor removed    . MANDIBLE FRACTURE SURGERY    . REPAIR VAGINAL CUFF N/A 10/16/2016   Procedure: EXAM UNDER ANESTHESIA, REPAIR OF VAGINAL CUFF DEHISCENCE, DRAINGAGE OF PELVIC ABCESS;  Surgeon: Romualdo BolkJertson, Cary Wilford Evelyn, MD;  Location: [facility];  Service: Gynecology;  Laterality: N/A;  . TOTAL LAPAROSCOPIC HYSTERECTOMY WITH SALPINGECTOMY N/A 10/04/2016   Procedure:  HYSTERECTOMY TOTAL LAPAROSCOPIC WITH BILATERAL SALPINGO-OOPHORECTOMY AND CYSTOSCOPY PERITONEAL BIOPSY;  Surgeon: Jerene BearsMiller, Mary S, MD;  [facility] [place];  Service: Gynecology;  Laterality: N/A;  . WISDOM TOOTH EXTRACTION  12 years ago    Current Outpatient Prescriptions  Medication Sig Dispense Refill  . albuterol (PROVENTIL HFA;VENTOLIN HFA) 108 (90 Base) MCG/ACT inhaler Inhale 1-2 puffs into the lungs every 6 (six) hours as needed for wheezing or shortness of breath.    Marland Kitchen. albuterol (PROVENTIL) (2.5 MG/3ML) 0.083% nebulizer solution Take 2.5 mg by nebulization every 4 (four) hours as needed.     Marland Kitchen. alum & mag hydroxide-simeth (MAALOX/MYLANTA) 200-200-20 MG/5ML suspension Take 30 mLs by mouth every 4 (four) hours as needed for indigestion. 355 mL 0  . amoxicillin-clavulanate (AUGMENTIN) 875-125 MG tablet Take 1 tablet by mouth 2 (two) times daily. 20 tablet 0  . amphetamine-dextroamphetamine (ADDERALL) 5 MG tablet Take 5 mg by mouth daily.    . DiphenhydrAMINE HCl (BENADRYL PO) Take 25 mg by mouth at bedtime. As needed     . docusate sodium (COLACE) 100 MG capsule Take 1 capsule (100 mg total) by mouth 2 (two) times daily as needed for mild constipation. 60 capsule 0  . ibuprofen (ADVIL,MOTRIN) 800 MG tablet Take 1 tablet (800 mg total) by mouth every 8 (eight) hours as needed. 30 tablet 1  . montelukast (SINGULAIR) 10 MG  tablet Take 1 tablet (10 mg total) by mouth at bedtime. 90 tablet 4  . norethindrone (AYGESTIN) 5 MG tablet Take 1 tablet (5 mg total) by mouth daily. 30 tablet 0  . oxyCODONE-acetaminophen (PERCOCET) 5-325 MG tablet Take 1-2 tablets by mouth every 6 (six) hours as needed. use only as much as needed to relieve pain 30 tablet 0  . senna (SENOKOT) 8.6 MG TABS tablet Take 1 tablet (8.6 mg total) by mouth every 12 (twelve) hours as needed for moderate constipation. 60 each 0  . VYVANSE 30 MG capsule Take 1 capsule by mouth daily.  0   No current  facility-administered medications for this visit.      ALLERGIES: Nitrofurantoin  Family History  Problem Relation Age of Onset  . Hypertension Father   . Asthma Sister   . Diabetes Paternal Grandfather   . Diabetes Maternal Grandmother   . Breast cancer Maternal Grandmother   . Breast cancer Mother 33  . Lymphoma Paternal Grandmother        Non Hodgskins Lymphoma  . Anesthesia problems Neg Hx   . Hypotension Neg Hx   . Malignant hyperthermia Neg Hx   . Pseudochol deficiency Neg Hx     Social History   Social History  . Marital status: Married    Spouse name: N/A  . Number of children: N/A  . Years of education: N/A   Occupational History  . Not on file.   Social History Main Topics  . Smoking status: Former Smoker    Types: Cigarettes    Quit date: 04/20/2013  . Smokeless tobacco: Never Used  . Alcohol use 0.0 oz/week     Comment: occas   . Drug use: No  . Sexual activity: Not Currently    Partners: Male    Birth control/ protection: Other-see comments     Comment: vasectomy    Other Topics Concern  . Not on file   Social History Narrative  . No narrative on file    Review of Systems  Constitutional: Negative.   HENT: Negative.   Eyes: Negative.   Respiratory: Negative.   Cardiovascular: Negative.   Gastrointestinal: Negative.   Genitourinary:       Bleeding  Musculoskeletal: Negative.   Skin: Negative.   Neurological: Negative.   Endo/Heme/Allergies: Negative.   Psychiatric/Behavioral: Negative.     PHYSICAL EXAMINATION:    BP (!) 100/58 (BP Location: Right Arm, Patient Position: Sitting, Cuff Size: Normal)   Pulse 80   Resp 14   Ht 5\' 8"  (1.727 m)   Wt 149 lb (67.6 kg)   LMP 07/03/2013   BMI 22.66 kg/m     General appearance: alert, cooperative and appears stated age Abdomen: soft, non-tender; non-distended; no masses,  no organomegaly  Pelvic: External genitalia:  no lesions              Urethra:  normal appearing urethra with no  masses, tenderness or lesions              Bartholins and Skenes: normal                 Vagina: normal appearing vagina with normal color and discharge, no lesions              Cervix: absent   Vaginal cuff: intact, stitches seen, no stitches have pulled through. Small amount of brown blood in the vagina, no odor, unable to see the source of the drainage.  Bimanual Exam: cuff is less indurated/fullness decreased above cuff, initially not tender, then point tenderness on the cuff posteriorly in the midline (over a stitch).   Chaperone was present for exam.  Lab Results  Component Value Date   WBC 7.1 10/20/2016   HGB 9.6 (L) 10/20/2016   HCT 28.7 (L) 10/20/2016   MCV 81 10/20/2016   PLT 683 (H) 10/20/2016   Vaginal cuff abscess culture negative after 3 days.   ASSESSMENT POD #16 s/p TLH/BSO for stage 4 endometriosis POD #5 S/P drainage of pelvic abscess and repair of vaginal cuff dehiscence. She had a drain in the abscess for the first 2 days s/p repair. Initially after removing the drain she had no further drainage, she started draining again yesterday. The drainage is all brown, no odor, she is feeling well, no abdominal tenderness, WBC is normal (still with some segmented neutrophils and bands, but decreasing). Her cuff is intact. The fullness above her cuff continues to improve.    PLAN She will call with pain, increase in d/c, new odor, fevers or any other concerns She will continue with the Augmentin, f/u on Monday, sooner with concerns   An After Visit Summary was printed and given to the patient.  20 minutes face to face time of which over 50% was spent in counseling.   CC: Dr Hyacinth Meeker

## 2016-10-25 ENCOUNTER — Ambulatory Visit (INDEPENDENT_AMBULATORY_CARE_PROVIDER_SITE_OTHER): Payer: PRIVATE HEALTH INSURANCE | Admitting: Obstetrics & Gynecology

## 2016-10-25 ENCOUNTER — Ambulatory Visit: Payer: PRIVATE HEALTH INSURANCE | Admitting: Obstetrics & Gynecology

## 2016-10-25 ENCOUNTER — Encounter: Payer: Self-pay | Admitting: Obstetrics & Gynecology

## 2016-10-25 VITALS — BP 118/74 | HR 84 | Resp 14 | Ht 68.0 in | Wt 146.0 lb

## 2016-10-25 DIAGNOSIS — G8918 Other acute postprocedural pain: Secondary | ICD-10-CM

## 2016-10-25 DIAGNOSIS — N9489 Other specified conditions associated with female genital organs and menstrual cycle: Secondary | ICD-10-CM

## 2016-10-25 DIAGNOSIS — N739 Female pelvic inflammatory disease, unspecified: Secondary | ICD-10-CM

## 2016-10-25 MED ORDER — TERCONAZOLE 0.4 % VA CREA: TOPICAL_CREAM | VAGINAL | 0 refills | Status: DC

## 2016-10-25 MED ORDER — LIDOCAINE 5 % EX OINT
1.0000 | TOPICAL_OINTMENT | Freq: Four times a day (QID) | CUTANEOUS | 0 refills | Status: DC | PRN
Start: 2016-10-25 — End: 2016-10-25

## 2016-10-25 MED ORDER — LIDOCAINE 5 % EX OINT: 1.0000 "application " | TOPICAL_OINTMENT | Freq: Four times a day (QID) | CUTANEOUS | 0 refills | Status: DC | PRN

## 2016-10-25 MED ORDER — HYDROCORTISONE 2.5 % RE CREA: 1.0000 "application " | TOPICAL_CREAM | Freq: Four times a day (QID) | RECTAL | 0 refills | Status: DC

## 2016-10-25 NOTE — Progress Notes (Signed)
Post Operative Visit  Procedure: 10/04/16- HYSTERECTOMY TOTAL LAPAROSCOPIC WITH BILATERAL SALPINGO-OOPHORECTOMY AND CYSTOSCOPY PERITONEAL BIOPSY   10/16/16- EXAM UNDER ANESTHESIA, REPAIR OF VAGINAL CUFF DEHISCENCE, DRAINGAGE OF PELVIC ABCESS  Days Post-op:  21 days, 9 days   Subjective: Reports she is feeling "much, much" better.  Reports she is having some light spotting that started last week after sweeping.  This is not heavy and is just spotting.  Normal bowel function.  Reports she's having some "soreness" but no real pain.  Has not taken Percocet for a week.    Emptying her bladder normal.    Feels raw on her skin that she thinks is from the pads.    Reports issues with hemorrhoids that started this weekend.  Itching.  No bleeding.  Recurrent issue for her since birth of first son.  Has nothing for this.  Objective: BP 118/74 (BP Location: Right Arm, Patient Position: Sitting, Cuff Size: Normal)   Pulse 84   Resp 14   Ht 5\' 8"  (1.727 m)   Wt 146 lb (66.2 kg)   LMP 07/03/2013   BMI 22.20 kg/m   EXAM General: alert and cooperative Resp: clear to auscultation bilaterally Cardio: regular rate and rhythm, S1, S2 normal, no murmur, click, rub or gallop GI: soft, non-tender; bowel sounds normal; no masses,  no organomegaly and incision: clean, dry and intact Extremities: extremities normal, atraumatic, no cyanosis or edema Vaginal Bleeding: minimal Gyn:  Erythematous skin with whitish exudate, vaginal mucosa pink without discharge, cuff intact, non-malodorous scant bleeding, no active source, non tender, no fullness or masses noted  Assessment: s/p TLH/BSO/LOA/cystoscopy with post op pelvic bleeding with subsequent pelvic infection and then cuff dehiscence  S/p vaginal cuff repair after drainage of pelvic abscess and cuff dehiscence Hemorrhoids  Plan: Recheck 1 weeks Pt aware of limitations--no heavy lifting, pulling, tugging and nothing in vagina for 12 weeks post-op from  second procedure Terazol 7 to skin BID Anusol HC q 6 hours prn and topical Lidocaine 5% sparingly just to hemorrhoid with pain

## 2016-10-28 ENCOUNTER — Ambulatory Visit (INDEPENDENT_AMBULATORY_CARE_PROVIDER_SITE_OTHER): Payer: PRIVATE HEALTH INSURANCE | Admitting: Obstetrics & Gynecology

## 2016-10-28 ENCOUNTER — Encounter: Payer: Self-pay | Admitting: Obstetrics & Gynecology

## 2016-10-28 ENCOUNTER — Telehealth: Payer: Self-pay | Admitting: *Deleted

## 2016-10-28 ENCOUNTER — Ambulatory Visit (INDEPENDENT_AMBULATORY_CARE_PROVIDER_SITE_OTHER): Payer: PRIVATE HEALTH INSURANCE

## 2016-10-28 VITALS — BP 114/80 | HR 94 | Temp 98.3°F | Resp 14 | Ht 68.0 in | Wt 144.0 lb

## 2016-10-28 DIAGNOSIS — N739 Female pelvic inflammatory disease, unspecified: Secondary | ICD-10-CM | POA: Diagnosis not present

## 2016-10-28 DIAGNOSIS — R1900 Intra-abdominal and pelvic swelling, mass and lump, unspecified site: Secondary | ICD-10-CM

## 2016-10-28 MED ORDER — AMOXICILLIN-POT CLAVULANATE 875-125 MG PO TABS: 1.0000 | ORAL_TABLET | Freq: Two times a day (BID) | ORAL | 0 refills | Status: DC

## 2016-10-28 MED ORDER — ESTRADIOL-NORETHINDRONE ACET 1-0.5 MG PO TABS: 1.0000 | ORAL_TABLET | Freq: Every day | ORAL | 12 refills | Status: DC

## 2016-10-28 NOTE — Progress Notes (Signed)
GYNECOLOGY  VISIT   HPI: 31 y.o. [id number] Divorced Caucasian female here for complaint of increased moodiness and tearfulness that really seems to have changed over the last three days.  She feels like she can cry at almost anything.  Reports she has also begun to feel some decreased desire to leave her home.  She is worried that this will worsen.  She actually communicated with me today via text and I recommended that she begin HRT now, although I was trying to wait further post op due to significant endometriosis that was found at time of hysterectomy.  I asked supervising RN in my office to call pt today and also assess over the phone.  Pt reported with phone assessment in addition to the mood changes and tearfulness that she has experienced some nausea the last 24 hours or so.  No emesis.  No fevers.  Having BMs.  Had one today.  No increased bloating.  She was started on Activella by my instructions today.  Pt did go ahead and take this but due to her increased nausea, recommended OV.  Reports she feels her vaginal discharge is more mucous like with slight redness.  Denies any bleeding that looks like actual or frank blood.  Dark coloration of discharge has resolved.  Denies odor.    GYNECOLOGIC HISTORY: Patient's last menstrual period was 07/03/2013. Contraception: Hysterectomy  Patient Active Problem List   Diagnosis Date Noted  . Pelvic infection in female 10/15/2016  . Elevated white blood cell count 10/15/2016  . Endometriosis 10/04/2016  . Dysplasia of cervix, low grade (CIN 1) 09/26/2016  . Pelvic pain 09/26/2016  . Mild intermittent asthma 10/13/2015  . ADHD (attention deficit hyperactivity disorder), combined type 06/18/2015  . Frequent UTI 06/11/2015  . S/P endometrial ablation 07/10/2013  . History of biliary T-tube placement 07/10/2013    Past Medical History:  Diagnosis Date  . Asthma   . Complication of anesthesia   . GERD (gastroesophageal reflux disease)   .  History of kidney infection during pregnancy   . PONV (postoperative nausea and vomiting)     Past Surgical History:  Procedure Laterality Date  . CYSTOSCOPY N/A 10/04/2016   Procedure: CYSTOSCOPY;  Surgeon: Jerene BearsMiller, Mary S, MD;  [facility] [place];  Service: Gynecology;  Laterality: N/A;  . ENDOMETRIAL ABLATION    . fatty tumor removed    . MANDIBLE FRACTURE SURGERY    . REPAIR VAGINAL CUFF N/A 10/16/2016   Procedure: EXAM UNDER ANESTHESIA, REPAIR OF VAGINAL CUFF DEHISCENCE, DRAINGAGE OF PELVIC ABCESS;  Surgeon: Romualdo BolkJertson, Jill Evelyn, MD;  Location: [facility];  Service: Gynecology;  Laterality: N/A;  . TOTAL LAPAROSCOPIC HYSTERECTOMY WITH SALPINGECTOMY N/A 10/04/2016   Procedure: HYSTERECTOMY TOTAL LAPAROSCOPIC WITH BILATERAL SALPINGO-OOPHORECTOMY AND CYSTOSCOPY PERITONEAL BIOPSY;  Surgeon: Jerene BearsMiller, Mary S, MD;  [facility] [place];  Service: Gynecology;  Laterality: N/A;  . WISDOM TOOTH EXTRACTION  12 years ago    MEDS:  Reviewed in EPIC and UTD  ALLERGIES: Nitrofurantoin  Family History  Problem Relation Age of Onset  . Hypertension Father   . Asthma Sister   . Diabetes Paternal Grandfather   . Diabetes Maternal Grandmother   . Breast cancer Maternal Grandmother   . Breast cancer Mother 2055  . Lymphoma Paternal Grandmother        Non Hodgskins Lymphoma  . Anesthesia problems Neg Hx   . Hypotension Neg Hx   . Malignant hyperthermia Neg Hx   . Pseudochol deficiency Neg  Hx     SH:  Divorced, non smoker  Review of Systems  Constitutional: Negative for fever and malaise/fatigue.  Respiratory: Negative for shortness of breath.   Cardiovascular: Negative for chest pain.  Gastrointestinal: Positive for nausea and vomiting. Negative for abdominal pain and constipation.  Genitourinary:       Scant vaginal d/c as per above  Neurological: Negative for weakness.  Psychiatric/Behavioral:       Tearfulness    PHYSICAL EXAMINATION:    BP 114/80 (BP  Location: Right Arm, Patient Position: Sitting, Cuff Size: Normal)   Pulse 94   Temp 98.3 F (36.8 C) (Oral)   Resp 14   Ht 5\' 8"  (1.727 m)   Wt 144 lb (65.3 kg)   LMP 07/03/2013   BMI 21.90 kg/m     General appearance: alert, cooperative and appears stated age CV:  Regular rate and rhythm Lungs:  clear to auscultation, no wheezes, rales or rhonchi, symmetric air entry Abdomen: soft, non-tender; bowel sounds normal; no masses,  no organomegaly  Pelvic: External genitalia:  Mild erythema of inferior labia majora, improved, this extends down around the rectum              Urethra:  normal appearing urethra with no masses, tenderness or lesions              Bartholins and Skenes: normal                 Vagina: normal appearing vagina, intact cuff, non-tender, fullness on right side of cuff that was not appreciated two days ago              Cervix: absent              Bimanual Exam:  Uterus:  uterus absent             Anus: no lesions  Chaperone was present for exam.   PUS performed today due to fullness on exam. 1.5 x 3.5cm hematoma appearing area in location of fullness on exam.  Surrounding echogenic fluid with cystic spaces noted measuring 5.7 x 4.4cm on TVUS, 9 x 6cm on transabdominal imaging.  Assessment: Possible persistent abscess in pt with hx of post operative pelvic hematoma, then pelvic abscess, then cuff dehiscence with abscess/hematoma evacuation and repair of vaginal cuff  Plan: Continue Augmentin 875/125mg  BID Stat CBC CMP If WBC ct elevated, admit for IV antibiotics and hopeful percutaneous drainage of pelvic abscess with IR.  Advised pt results will be communicated to pt.  May need to proceed with drainage if WBC ct normal but this can be scheduled with IR.  Pt voices clear understanding of plan.  Aware to call with any changes in status including fever.  She does have my contact information if needed.

## 2016-10-28 NOTE — Telephone Encounter (Signed)
Call from patient at 1430. Patient asking " is it normal if I have no appetite?"  Reports no appetite for 3 days and states this is getting worse.  Realized that she had nausea yesterday that has increased today and has progressed to dry heaves. Has one more dose of antibiotic to complete and started the Activella today. Advised since continuing to feel worse, recommend come to office today at 430 for recheck.  Patient agreeable.   Routing to provider for final review. Patient agreeable to disposition. Will close encounter.   '

## 2016-10-28 NOTE — Telephone Encounter (Signed)
Per instructions from Dr Hyacinth MeekerMiller: Follow-up call to patient. Reports she is " just ok." Noticeable hesitation to answer. Voice very quite and subdued. Reports no motivation to do anything.  Cries all the time for no reason and can not explain why.  Denies thought to harm self or others. States "cant do anything so what's the point?"  Support given. Discussed hormonal changes following surgery and options for treatment. HRT and/or antidepressant as instructed by Dr Hyacinth MeekerMiller. Patient agreeable to these options and would like to start with hormone therapy first.  Per Dr Hyacinth MeekerMiller can try this and reassess at appointment then determine if need to add antidepressant. Patient agreeable to this option. Will send prescription  for Activella. Patient will begin today, stop  Aygestin.  Has recheck with Dr Hyacinth MeekerMiller on 11-01-16.  Stressed to call back to office or provider on call for any change/worsening of symptoms prior to appointment. Patient agreeable.  Routing to provider for final review. Patient agreeable to disposition. Will close encounter.

## 2016-10-29 ENCOUNTER — Telehealth: Payer: Self-pay | Admitting: *Deleted

## 2016-10-29 DIAGNOSIS — N739 Female pelvic inflammatory disease, unspecified: Secondary | ICD-10-CM

## 2016-10-29 LAB — CBC
HEMATOCRIT: 37.5 % (ref 34.0–46.6)
Hemoglobin: 12.5 g/dL (ref 11.1–15.9)
MCH: 27.5 pg (ref 26.6–33.0)
MCHC: 33.3 g/dL (ref 31.5–35.7)
MCV: 83 fL (ref 79–97)
Platelets: 507 10*3/uL — ABNORMAL HIGH (ref 150–379)
RBC: 4.54 x10E6/uL (ref 3.77–5.28)
RDW: 13.6 % (ref 12.3–15.4)
WBC: 6.7 10*3/uL (ref 3.4–10.8)

## 2016-10-29 LAB — COMPREHENSIVE METABOLIC PANEL
ALBUMIN: 4.7 g/dL (ref 3.5–5.5)
ALK PHOS: 88 IU/L (ref 39–117)
ALT: 19 IU/L (ref 0–32)
AST: 10 IU/L (ref 0–40)
Albumin/Globulin Ratio: 1.5 (ref 1.2–2.2)
BILIRUBIN TOTAL: 0.4 mg/dL (ref 0.0–1.2)
BUN / CREAT RATIO: 18 (ref 9–23)
BUN: 14 mg/dL (ref 6–20)
CHLORIDE: 102 mmol/L (ref 96–106)
CO2: 21 mmol/L (ref 20–29)
CREATININE: 0.77 mg/dL (ref 0.57–1.00)
Calcium: 9.7 mg/dL (ref 8.7–10.2)
GFR calc non Af Amer: 102 mL/min/{1.73_m2} (ref >59)
GFR, EST AFRICAN AMERICAN: 117 mL/min/{1.73_m2} (ref >59)
GLOBULIN, TOTAL: 3.1 g/dL (ref 1.5–4.5)
GLUCOSE: 93 mg/dL (ref 65–99)
Potassium: 4.9 mmol/L (ref 3.5–5.2)
SODIUM: 140 mmol/L (ref 134–144)
TOTAL PROTEIN: 7.8 g/dL (ref 6.0–8.5)

## 2016-10-29 NOTE — Telephone Encounter (Signed)
Orders received from Dr Hyacinth MeekerMiller: No Procedure at this time. Resume regular diet.  OV with PUS on 11-04-16. Continue antibiotics and Activella. Call for pain, fever or change in status. Update on Monday.  Call to patient. Discussion of instructions from Dr Hyacinth MeekerMiller. Patient agreeable to plan. Has contact information for on call provider. Appointment scheduled for 11-04-16 at 1230.  Routing to provider for final review. Patient agreeable to disposition. Will close encounter.

## 2016-11-01 ENCOUNTER — Telehealth: Payer: Self-pay | Admitting: Obstetrics & Gynecology

## 2016-11-01 ENCOUNTER — Ambulatory Visit: Payer: PRIVATE HEALTH INSURANCE | Admitting: Obstetrics & Gynecology

## 2016-11-01 NOTE — Telephone Encounter (Signed)
Called patient because she missed her 1 week recheck appointment with Dr. Hyacinth MeekerMiller. Lizzie said she spoke with Kennon RoundsSally about cancelling the appointment and she is waiting to hear back from her. Appointment cancelled and [facility] up.

## 2016-11-01 NOTE — Telephone Encounter (Signed)
Patient is calling to speak with Kennon RoundsSally. No information given.

## 2016-11-01 NOTE — Telephone Encounter (Signed)
Return call to patient. States she has continued lower back discomfort as well as mild pelvic cramping.  This has continued throughout the weekend on a scale of 3-4/10. Denies fever and actually feels better but thinks this is related to emotions being more stable. States having some red bleeding that requires pad change a "few times per day" but is heavier than panty liner. Discussed option of office visit if desired versus continued monitoring and follow-up as scheduled for Thursday. Patient agreeable to follow-up on Thursday.  Instructed to continue monitor/observeand call if any changes or concerns. Also advised Dr Hyacinth MeekerMiller will review call and will call back if any change in recommendations.

## 2016-11-01 NOTE — Telephone Encounter (Signed)
Reviewed call with Dr Hyacinth MeekerMiller. Agreeable to plan as discussed with patient. Patient states she is taking Motrin as needed for cramps. Will call for other changes. Appointment scheduled for 11-04-16 at 1230.   Routing to provider for final review. Patient agreeable to disposition. Will close encounter.

## 2016-11-03 ENCOUNTER — Other Ambulatory Visit: Payer: Self-pay | Admitting: Obstetrics & Gynecology

## 2016-11-03 NOTE — Telephone Encounter (Signed)
Medication refill request: aygestin  Last AEX:  08/16/16 SM  Next AEX: 09/12/17  Last MMG (if hormonal medication request): none Refill authorized: 10/06/16 #[id number]. Today please advise.   Routed to [id number]

## 2016-11-04 ENCOUNTER — Ambulatory Visit (INDEPENDENT_AMBULATORY_CARE_PROVIDER_SITE_OTHER): Payer: PRIVATE HEALTH INSURANCE | Admitting: Obstetrics & Gynecology

## 2016-11-04 ENCOUNTER — Ambulatory Visit (INDEPENDENT_AMBULATORY_CARE_PROVIDER_SITE_OTHER): Payer: PRIVATE HEALTH INSURANCE

## 2016-11-04 ENCOUNTER — Encounter: Payer: Self-pay | Admitting: Obstetrics & Gynecology

## 2016-11-04 DIAGNOSIS — N739 Female pelvic inflammatory disease, unspecified: Secondary | ICD-10-CM

## 2016-11-04 DIAGNOSIS — N9489 Other specified conditions associated with female genital organs and menstrual cycle: Secondary | ICD-10-CM

## 2016-11-04 NOTE — Progress Notes (Signed)
Post Operative Visit  Procedure: TLH/BSO/cystoscopy on 10/04/16 complicated by post operative bleeding, then development of pelvic abscess with cuff dehiscence and repair with EUA 10/16/16   Subjective: Reports she is feeling much better emotionally since starting HRT.  Wonders about lowering dosage but I do not want to make any changes until abscess/hematoma is fully resolved.  Denies vaginal bleeding or discharge.  Having some upper right back pain that she always attributes to kidney stones.  This is mild.  Bowel and bladder function are normal.  She finishes antibiotics today.  Needs note for work.  Would like to start back three days a week next week if possible.  Accompanied by her mother today.  Energy is pretty good, per her, all things considered.  Ultrasound performed again today.  Hematoma/abscess measures 6 x 3 x 6cm decreased in size from prior week when measured 8 x 9cm.  Objective: LMP 07/03/2013   EXAM General: alert, cooperative and no distress Resp: clear to auscultation bilaterally Cardio: regular rate and rhythm, S1, S2 normal, no murmur, click, rub or gallop GI: soft, non-tender; bowel sounds normal; no masses,  no organomegaly Extremities: extremities normal, atraumatic, no cyanosis or edema Vaginal Bleeding: none  Gyn:  NAEFG, cuff intact and non tender, right sided fullness/mass still noted but smaller, no erythema.  Assessment: s/p TLH/BSO/cystoscopy Then EUA, abscess evacuation, repair of cuff Improvement on ultrasound in size/appearance of hematoma/abscess  Plan: Reviewed limitations with pt.  She will return to work three days a week.  Letter provided. Recheck with PUS two weeks. Pt has my number and knows to call with any issues/concerns.

## 2016-11-07 ENCOUNTER — Encounter: Payer: Self-pay | Admitting: Obstetrics & Gynecology

## 2016-11-08 ENCOUNTER — Telehealth: Payer: Self-pay | Admitting: Obstetrics & Gynecology

## 2016-11-08 DIAGNOSIS — N739 Female pelvic inflammatory disease, unspecified: Secondary | ICD-10-CM

## 2016-11-08 NOTE — Telephone Encounter (Signed)
Routing to Sally Yeakley, RN. 

## 2016-11-08 NOTE — Telephone Encounter (Signed)
Patient is asking to talk with Kennon RoundsSally. No open phone note. Patient did not give any details. Patient said "I was told to ask for Kennon RoundsSally if needed anything'".

## 2016-11-08 NOTE — Telephone Encounter (Signed)
Return call to patient. States she was calling to check on follow-up ultrasound appointment needed for 11-18-16. Appointment scheduled for 12:30 on 11-18-16. Patient states she is tolerating being back at work. Some fatigue. No bleeding. Just feels "emotional." Discussed that fatigue may be playing a part of this and she is still recovering. Support given. Call prn.  Routing to provider for final review. Patient agreeable to disposition. Will close encounter.

## 2016-11-12 ENCOUNTER — Encounter: Payer: Self-pay | Admitting: Obstetrics & Gynecology

## 2016-11-15 ENCOUNTER — Telehealth: Payer: Self-pay | Admitting: Obstetrics & Gynecology

## 2016-11-15 ENCOUNTER — Ambulatory Visit (INDEPENDENT_AMBULATORY_CARE_PROVIDER_SITE_OTHER): Payer: PRIVATE HEALTH INSURANCE | Admitting: Obstetrics & Gynecology

## 2016-11-15 ENCOUNTER — Encounter: Payer: Self-pay | Admitting: Obstetrics & Gynecology

## 2016-11-15 VITALS — BP 112/76 | HR 82 | Resp 14 | Wt 143.0 lb

## 2016-11-15 DIAGNOSIS — N898 Other specified noninflammatory disorders of vagina: Secondary | ICD-10-CM | POA: Diagnosis not present

## 2016-11-15 DIAGNOSIS — R309 Painful micturition, unspecified: Secondary | ICD-10-CM

## 2016-11-15 DIAGNOSIS — N3090 Cystitis, unspecified without hematuria: Secondary | ICD-10-CM | POA: Diagnosis not present

## 2016-11-15 LAB — POCT URINALYSIS DIPSTICK
BILIRUBIN UA: NEGATIVE
Blood, UA: NEGATIVE
GLUCOSE UA: NEGATIVE
KETONES UA: NEGATIVE
Nitrite, UA: POSITIVE
Protein, UA: NEGATIVE
Urobilinogen, UA: 0.2 E.U./dL
pH, UA: 5 (ref 5.0–8.0)

## 2016-11-15 MED ORDER — SULFAMETHOXAZOLE-TRIMETHOPRIM 800-160 MG PO TABS: 1.0000 | ORAL_TABLET | Freq: Two times a day (BID) | ORAL | 0 refills | Status: DC

## 2016-11-15 NOTE — Telephone Encounter (Signed)
Patient calling to speak with Kennon RoundsSally. No other information given.

## 2016-11-15 NOTE — Progress Notes (Signed)
GYNECOLOGY  VISIT   HPI: 31 y.o. [id number] Divorced Caucasian female here for complaint of increased odor with urine.  States is has "that typical smell".  Has increased urgency and frequency.  Denies back pain.  Denies fever.  Denies vaginal bleeding.  Having some mild vaginal odor/dicharge as well.  Has PUS scheduled for Thursday to assess pelvic hematoma/abscess.  Off antibiotics for about two weeks.  Restarted her vyvanse and feels the dosage is too high.  Was really feeling "good" from hormonal standpoint until restarted this medication.  Would like to lower this.  Advised she will need to review with her PCP for this.    GYNECOLOGIC HISTORY: Patient's last menstrual period was 07/03/2013. Contraception: hysterectomy Menopausal hormone therapy: Activella  Patient Active Problem List   Diagnosis Date Noted  . Pelvic infection in female 10/15/2016  . Elevated white blood cell count 10/15/2016  . Endometriosis 10/04/2016  . Dysplasia of cervix, low grade (CIN 1) 09/26/2016  . Mild intermittent asthma 10/13/2015  . ADHD (attention deficit hyperactivity disorder), combined type 06/18/2015  . Frequent UTI 06/11/2015  . S/P endometrial ablation 07/10/2013  . History of biliary T-tube placement 07/10/2013    Past Medical History:  Diagnosis Date  . Asthma   . Complication of anesthesia   . GERD (gastroesophageal reflux disease)   . History of kidney infection during pregnancy   . PONV (postoperative nausea and vomiting)     Past Surgical History:  Procedure Laterality Date  . CYSTOSCOPY N/A 10/04/2016   Procedure: CYSTOSCOPY;  Surgeon: Jerene BearsMiller, Catrinia Racicot S, MD;  [facility] [place];  Service: Gynecology;  Laterality: N/A;  . ENDOMETRIAL ABLATION    . fatty tumor removed    . MANDIBLE FRACTURE SURGERY    . REPAIR VAGINAL CUFF N/A 10/16/2016   Procedure: EXAM UNDER ANESTHESIA, REPAIR OF VAGINAL CUFF DEHISCENCE, DRAINGAGE OF PELVIC ABCESS;  Surgeon: Romualdo BolkJertson, Jill Evelyn,  MD;  Location: [facility];  Service: Gynecology;  Laterality: N/A;  . TOTAL LAPAROSCOPIC HYSTERECTOMY WITH SALPINGECTOMY N/A 10/04/2016   Procedure: HYSTERECTOMY TOTAL LAPAROSCOPIC WITH BILATERAL SALPINGO-OOPHORECTOMY AND CYSTOSCOPY PERITONEAL BIOPSY;  Surgeon: Jerene BearsMiller, Shiquan Mathieu S, MD;  [facility] ;  Service: Gynecology;  Laterality: N/A;  . WISDOM TOOTH EXTRACTION  12 years ago    MEDS:   Current Outpatient Prescriptions on File Prior to Visit  Medication Sig Dispense Refill  . albuterol (PROVENTIL HFA;VENTOLIN HFA) 108 (90 Base) MCG/ACT inhaler Inhale 1-2 puffs into the lungs every 6 (six) hours as needed for wheezing or shortness of breath.    Marland Kitchen. albuterol (PROVENTIL) (2.5 MG/3ML) 0.083% nebulizer solution Take 2.5 mg by nebulization every 4 (four) hours as needed.     Marland Kitchen. amphetamine-dextroamphetamine (ADDERALL) 5 MG tablet Take 5 mg by mouth daily.    . DiphenhydrAMINE HCl (BENADRYL PO) Take 25 mg by mouth at bedtime. As needed     . estradiol-norethindrone (ACTIVELLA) 1-0.5 MG tablet Take 1 tablet by mouth daily. 30 tablet 12  . hydrocortisone (ANUSOL-HC) 2.5 % rectal cream Place 1 application rectally 4 (four) times daily. 30 g 0  . ibuprofen (ADVIL,MOTRIN) 800 MG tablet Take 1 tablet (800 mg total) by mouth every 8 (eight) hours as needed. 30 tablet 1  . lidocaine (XYLOCAINE) 5 % ointment Apply 1 application topically 4 (four) times daily as needed. 1.25 g 0  . montelukast (SINGULAIR) 10 MG tablet Take 1 tablet (10 mg total) by mouth at bedtime. 90 tablet 4  . senna (SENOKOT) 8.6 MG  TABS tablet Take 1 tablet (8.6 mg total) by mouth every 12 (twelve) hours as needed for moderate constipation. 60 each 0  . terconazole (TERAZOL 7) 0.4 % vaginal cream Apply externally twice daily 45 g 0  . VYVANSE 30 MG capsule Take 1 capsule by mouth daily.  0   No current facility-administered medications on file prior to visit.      ALLERGIES: Nitrofurantoin  Family History  Problem  Relation Age of Onset  . Hypertension Father   . Asthma Sister   . Diabetes Paternal Grandfather   . Diabetes Maternal Grandmother   . Breast cancer Maternal Grandmother   . Breast cancer Mother 67  . Lymphoma Paternal Grandmother        Non Hodgskins Lymphoma  . Anesthesia problems Neg Hx   . Hypotension Neg Hx   . Malignant hyperthermia Neg Hx   . Pseudochol deficiency Neg Hx     SH:  Divorced, non smoker  Review of Systems  Constitutional: Negative.   Respiratory: Negative.   Cardiovascular: Negative.   Gastrointestinal: Negative for abdominal pain, constipation, diarrhea, heartburn, nausea and vomiting.  Genitourinary: Positive for dysuria, frequency and urgency. Negative for hematuria.    PHYSICAL EXAMINATION:    BP 112/76 (BP Location: Right Arm, Patient Position: Sitting, Cuff Size: Normal)   Pulse 82   Resp 14   Wt 143 lb (64.9 kg)   LMP 07/03/2013   BMI 21.74 kg/m     General appearance: alert, cooperative and appears stated age CV:  Regular rate and rhythm Lungs:  clear to auscultation, no wheezes, rales or rhonchi, symmetric air entry Abdomen: soft, non-tender; bowel sounds normal; no masses,  no organomegaly Flank:  No CVA tenderness  Pelvic: External genitalia:  no lesions              Urethra:  normal appearing urethra with no masses, tenderness or lesions              Bartholins and Skenes: normal                 Vagina: normal appearing vagina with normal color and discharge, cuff intact, sutures present, no mass or fullness felt on exam              Cervix: absent              Bimanual Exam:  Uterus:  uterus absent              Adnexa: no mass, fullness, tenderness              Anus:  no lesions  Chaperone was present for exam.  Assessment: Increased urinary frequency, urgency, possible UTI H/O pelvic hematoma/abscess after hysterectomy, improving ADHD--may need medication adjustment Mild vaginal odor/dicharge  Plan: Bactrim DS BID x 5  days. Urine culture pending Affirm pending Pt will plan to follow-up with PCP regarding ADHD medication

## 2016-11-15 NOTE — Telephone Encounter (Signed)
Routing to Sally Yeakley, RN. 

## 2016-11-15 NOTE — Telephone Encounter (Signed)
Call to patient. States she thinks she has UTI versus yeast infection. Beginning to have cramping and typical "burnt" odor. Denies fever. Office visit scheduled for 2pm. Declines earlier appointment.   Routing to provider for final review. Patient agreeable to disposition. Will close encounter.

## 2016-11-16 LAB — VAGINITIS/VAGINOSIS, DNA PROBE
Candida Species: NEGATIVE
Gardnerella vaginalis: POSITIVE — AB
TRICHOMONAS VAG: NEGATIVE

## 2016-11-17 LAB — URINE CULTURE

## 2016-11-18 ENCOUNTER — Ambulatory Visit (INDEPENDENT_AMBULATORY_CARE_PROVIDER_SITE_OTHER): Payer: PRIVATE HEALTH INSURANCE

## 2016-11-18 ENCOUNTER — Ambulatory Visit: Payer: PRIVATE HEALTH INSURANCE | Admitting: Obstetrics & Gynecology

## 2016-11-18 ENCOUNTER — Other Ambulatory Visit: Payer: Self-pay | Admitting: *Deleted

## 2016-11-18 ENCOUNTER — Encounter: Payer: Self-pay | Admitting: Obstetrics & Gynecology

## 2016-11-18 ENCOUNTER — Ambulatory Visit (INDEPENDENT_AMBULATORY_CARE_PROVIDER_SITE_OTHER): Payer: PRIVATE HEALTH INSURANCE | Admitting: Obstetrics & Gynecology

## 2016-11-18 VITALS — BP 104/68 | HR 84 | Resp 14

## 2016-11-18 DIAGNOSIS — N739 Female pelvic inflammatory disease, unspecified: Secondary | ICD-10-CM

## 2016-11-18 DIAGNOSIS — T148XXA Other injury of unspecified body region, initial encounter: Secondary | ICD-10-CM

## 2016-11-18 MED ORDER — FLUCONAZOLE 150 MG PO TABS: ORAL_TABLET | ORAL | 0 refills | Status: DC

## 2016-11-18 MED ORDER — TINIDAZOLE 500 MG PO TABS: ORAL_TABLET | ORAL | 0 refills | Status: DC

## 2016-11-18 NOTE — Progress Notes (Signed)
GYNECOLOGY  VISIT   HPI: 31 y.o. [id number] Divorced Caucasian female here for recheck and PUS after having post op bleeding that was complicated by pelvic abscess and cuff opening requiring abscess evacuation, vaginal drain placement and cuff closure.  She is back working full time.  Energy is pretty good.  Denies vaginal bleeding.  Had some vaginal odor/discharge that was BV.  D/w pt treatment options.  She would like to treated for this.  Also, urine culture was positive for E coli.  On correct antibiotic and feels much better.  States she feels good enough that she would like to discuss possibility of SA.  She KNOWS that she needs pelvic rest for 12 weeks after second surgery.  We have discussed this several times.  Advised risk of cuff dehiscence is much higher due to second procedure and she really needs the 12 weeks of pelvic rest.  Stopped taking her Vyvanse yesterday.  Feels better not taking this.  Aware I have left PCPs office two messages and have not heard back about Vyvanse.  Pt reports she is facebook friend with provider and will communicate with her via that method.  Knows she will need to be seen to start new medication.  Ultrasound today shows 3.0 x 1.8 x 4.4cm hematoma.  Improved in size from 6 x 3 x 6cm on 11/04/16.  Pt aware this could take another 6-8 weeks for resolution.  GYNECOLOGIC HISTORY: Patient's last menstrual period was 07/03/2013. Contraception: hysterectomy Menopausal hormone therapy: Activella  Patient Active Problem List   Diagnosis Date Noted  . Pelvic infection in female 10/15/2016  . Elevated white blood cell count 10/15/2016  . Endometriosis 10/04/2016  . Dysplasia of cervix, low grade (CIN 1) 09/26/2016  . Mild intermittent asthma 10/13/2015  . ADHD (attention deficit hyperactivity disorder), combined type 06/18/2015  . Frequent UTI 06/11/2015  . S/P endometrial ablation 07/10/2013  . History of biliary T-tube placement 07/10/2013    Past Medical  History:  Diagnosis Date  . Asthma   . Complication of anesthesia   . GERD (gastroesophageal reflux disease)   . History of kidney infection during pregnancy   . PONV (postoperative nausea and vomiting)     Past Surgical History:  Procedure Laterality Date  . CYSTOSCOPY N/A 10/04/2016   Procedure: CYSTOSCOPY;  Surgeon: Jerene Bears, MD;  Location: [facility];  Service: Gynecology;  Laterality: N/A;  . ENDOMETRIAL ABLATION    . fatty tumor removed    . MANDIBLE FRACTURE SURGERY    . REPAIR VAGINAL CUFF N/A 10/16/2016   Procedure: EXAM UNDER ANESTHESIA, REPAIR OF VAGINAL CUFF DEHISCENCE, DRAINGAGE OF PELVIC ABCESS;  Surgeon: Romualdo Bolk, MD;  Location: [facility];  Service: Gynecology;  Laterality: N/A;  . TOTAL LAPAROSCOPIC HYSTERECTOMY WITH SALPINGECTOMY N/A 10/04/2016   Procedure: HYSTERECTOMY TOTAL LAPAROSCOPIC WITH BILATERAL SALPINGO-OOPHORECTOMY AND CYSTOSCOPY PERITONEAL BIOPSY;  Surgeon: Jerene Bears, MD;  Location: [facility];  Service: Gynecology;  Laterality: N/A;  . WISDOM TOOTH EXTRACTION  12 years ago    MEDS:   Current Outpatient Prescriptions on File Prior to Visit  Medication Sig Dispense Refill  . albuterol (PROVENTIL HFA;VENTOLIN HFA) 108 (90 Base) MCG/ACT inhaler Inhale 1-2 puffs into the lungs every 6 (six) hours as needed for wheezing or shortness of breath.    Marland Kitchen albuterol (PROVENTIL) (2.5 MG/3ML) 0.083% nebulizer solution Take 2.5 mg by nebulization every 4 (four) hours as needed.     Marland Kitchen amphetamine-dextroamphetamine (ADDERALL) 5 MG tablet  Take 5 mg by mouth daily.    . DiphenhydrAMINE HCl (BENADRYL PO) Take 25 mg by mouth at bedtime. As needed     . estradiol-norethindrone (ACTIVELLA) 1-0.5 MG tablet Take 1 tablet by mouth daily. 30 tablet 12  . hydrocortisone (ANUSOL-HC) 2.5 % rectal cream Place 1 application rectally 4 (four) times daily. 30 g 0  . ibuprofen (ADVIL,MOTRIN) 800 MG tablet Take 1 tablet (800 mg total) by mouth  every 8 (eight) hours as needed. 30 tablet 1  . lidocaine (XYLOCAINE) 5 % ointment Apply 1 application topically 4 (four) times daily as needed. 1.25 g 0  . montelukast (SINGULAIR) 10 MG tablet Take 1 tablet (10 mg total) by mouth at bedtime. 90 tablet 4  . senna (SENOKOT) 8.6 MG TABS tablet Take 1 tablet (8.6 mg total) by mouth every 12 (twelve) hours as needed for moderate constipation. 60 each 0  . sulfamethoxazole-trimethoprim (BACTRIM DS,SEPTRA DS) 800-160 MG tablet Take 1 tablet by mouth 2 (two) times daily. 10 tablet 0  . terconazole (TERAZOL 7) 0.4 % vaginal cream Apply externally twice daily 45 g 0  . VYVANSE 30 MG capsule Take 1 capsule by mouth daily.  0   No current facility-administered medications on file prior to visit.      ALLERGIES: Nitrofurantoin  Family History  Problem Relation Age of Onset  . Hypertension Father   . Asthma Sister   . Diabetes Paternal Grandfather   . Diabetes Maternal Grandmother   . Breast cancer Maternal Grandmother   . Breast cancer Mother [id number]  . Lymphoma Paternal Grandmother        Non Hodgskins Lymphoma  . Anesthesia problems Neg Hx   . Hypotension Neg Hx   . Malignant hyperthermia Neg Hx   . Pseudochol deficiency Neg Hx     SH:  Divorced, non smoker  Review of Systems  All other systems reviewed and are negative.   PHYSICAL EXAMINATION:    BP 104/68 (BP Location: Right Arm, Patient Position: Sitting, Cuff Size: Normal)   Pulse 84   Resp 14   LMP 07/03/2013     General appearance: alert, cooperative and appears stated age CV:  Regular rate and rhythm Lungs:  clear to auscultation, no wheezes, rales or rhonchi, symmetric air entry Abdomen: soft, non-tender; bowel sounds normal; no masses,  no organomegaly Inc:  C/D/I  Pelvic: Not repeated today  Chaperone was present for exam.  Assessment: S/p TLH/BSO/LAO/cystoscopy due to Stage IV endometriosis Post op bleeding with subsequent pelvic abscess and cuff dehiscence with EUA,  abscess evacuation, vaginal drain placement and closure of cuff  Plan: Recheck 4 weeks and repeat PUS will be performed that day Note for full return to work but with lifting/pushing/pulling limitations written.

## 2016-11-18 NOTE — Progress Notes (Signed)
Patient scheduled while in office for PUS on 12/23/16 at 12:30pm with consult to follow at 1pm with Dr. Hyacinth MeekerMiller. Patient is agreeable to date and time.

## 2016-11-18 NOTE — Progress Notes (Unsigned)
ran

## 2016-11-25 ENCOUNTER — Ambulatory Visit: Payer: PRIVATE HEALTH INSURANCE | Admitting: Obstetrics & Gynecology

## 2016-12-07 ENCOUNTER — Encounter: Payer: Self-pay | Admitting: Obstetrics & Gynecology

## 2016-12-07 ENCOUNTER — Telehealth: Payer: Self-pay | Admitting: *Deleted

## 2016-12-07 ENCOUNTER — Ambulatory Visit (INDEPENDENT_AMBULATORY_CARE_PROVIDER_SITE_OTHER): Payer: PRIVATE HEALTH INSURANCE | Admitting: Obstetrics & Gynecology

## 2016-12-07 VITALS — BP 120/78 | HR 64 | Temp 98.1°F | Resp 16 | Ht 68.0 in | Wt 148.0 lb

## 2016-12-07 DIAGNOSIS — R109 Unspecified abdominal pain: Secondary | ICD-10-CM

## 2016-12-07 LAB — CBC WITH DIFFERENTIAL/PLATELET
BASOS: 0 %
Basophils Absolute: 0 10*3/uL (ref 0.0–0.2)
EOS (ABSOLUTE): 0.1 10*3/uL (ref 0.0–0.4)
EOS: 3 %
HEMATOCRIT: 38 % (ref 34.0–46.6)
Hemoglobin: 13.3 g/dL (ref 11.1–15.9)
LYMPHS: 43 %
Lymphocytes Absolute: 1.8 10*3/uL (ref 0.7–3.1)
MCH: 27.9 pg (ref 26.6–33.0)
MCHC: 35 g/dL (ref 31.5–35.7)
MCV: 80 fL (ref 79–97)
MONOCYTES: 7 %
Monocytes Absolute: 0.3 10*3/uL (ref 0.1–0.9)
NEUTROS ABS: 2 10*3/uL (ref 1.4–7.0)
Neutrophils: 47 %
Platelets: 197 10*3/uL (ref 150–379)
RBC: 4.77 x10E6/uL (ref 3.77–5.28)
RDW: 14.3 % (ref 12.3–15.4)
WBC: 4.3 10*3/uL (ref 3.4–10.8)

## 2016-12-07 LAB — POCT URINALYSIS DIPSTICK
BILIRUBIN UA: NEGATIVE
Blood, UA: NEGATIVE
GLUCOSE UA: NEGATIVE
KETONES UA: NEGATIVE
LEUKOCYTES UA: NEGATIVE
Nitrite, UA: NEGATIVE
PH UA: 5 (ref 5.0–8.0)
Protein, UA: NEGATIVE
Urobilinogen, UA: 0.2 E.U./dL

## 2016-12-07 NOTE — Progress Notes (Signed)
GYNECOLOGY  VISIT   HPI: 31 y.o. [id number] Divorced Caucasian female here for back pain and cramping.  Reports she's had a little pelvic cramping over the weekend.  Denies vaginal bleeding or discharge.  No fevers.  However, this morning she awoke with upper left flank pain that has gradually worsened.  H/o stones on CT (but on right) noted 10/05/16.  No gross hematuria.  H/O TLH/BSO 10/04/16 complicated by post op bleeding/pelvic hematoma.  This did not require blood transfusion.  This became a pelvic abscess that caused partial cuff dehiscence and evacuation of infection with closure of cuff under anesthesia performed 10/16/16.  Persistent sterile hematoma has been present but decreasing in size.  Last PUS done in office was 11/18/16 and remaining hematoma was 3 x 4 x 1cm (decreased from 9cm x 6cm on 10/28/16).  Repeat PUS is scheduled for two weeks.  Back working full time.  Energy is good.  Has not been SA.  Did have UTI with culture showing E coli on 11/15/16.  This was treated and resolved.   Has not taken any pain medication.    GYNECOLOGIC HISTORY: Patient's last menstrual period was 07/03/2013. Contraception: hysterectomy Menopausal hormone therapy: activella   Patient Active Problem List   Diagnosis Date Noted  . Pelvic infection in female 10/15/2016  . Elevated white blood cell count 10/15/2016  . Endometriosis 10/04/2016  . Dysplasia of cervix, low grade (CIN 1) 09/26/2016  . Mild intermittent asthma 10/13/2015  . ADHD (attention deficit hyperactivity disorder), combined type 06/18/2015  . Frequent UTI 06/11/2015  . S/P endometrial ablation 07/10/2013  . History of biliary T-tube placement 07/10/2013    Past Medical History:  Diagnosis Date  . Asthma   . Complication of anesthesia   . GERD (gastroesophageal reflux disease)   . History of kidney infection during pregnancy   . PONV (postoperative nausea and vomiting)     Past Surgical History:  Procedure Laterality Date  .  CYSTOSCOPY N/A 10/04/2016   Procedure: CYSTOSCOPY;  Surgeon: Jerene Bears, MD;  Location: [facility];  Service: Gynecology;  Laterality: N/A;  . ENDOMETRIAL ABLATION    . fatty tumor removed    . MANDIBLE FRACTURE SURGERY    . REPAIR VAGINAL CUFF N/A 10/16/2016   Procedure: EXAM UNDER ANESTHESIA, REPAIR OF VAGINAL CUFF DEHISCENCE, DRAINGAGE OF PELVIC ABCESS;  Surgeon: Romualdo Bolk, MD;  Location: [facility];  Service: Gynecology;  Laterality: N/A;  . TOTAL LAPAROSCOPIC HYSTERECTOMY WITH SALPINGECTOMY N/A 10/04/2016   Procedure: HYSTERECTOMY TOTAL LAPAROSCOPIC WITH BILATERAL SALPINGO-OOPHORECTOMY AND CYSTOSCOPY PERITONEAL BIOPSY;  Surgeon: Jerene Bears, MD;  Location: [facility];  Service: Gynecology;  Laterality: N/A;  . WISDOM TOOTH EXTRACTION  12 years ago    MEDS:   Current Outpatient Prescriptions on File Prior to Visit  Medication Sig Dispense Refill  . albuterol (PROVENTIL HFA;VENTOLIN HFA) 108 (90 Base) MCG/ACT inhaler Inhale 1-2 puffs into the lungs every 6 (six) hours as needed for wheezing or shortness of breath.    Marland Kitchen albuterol (PROVENTIL) (2.5 MG/3ML) 0.083% nebulizer solution Take 2.5 mg by nebulization every 4 (four) hours as needed.     Marland Kitchen amphetamine-dextroamphetamine (ADDERALL) 5 MG tablet Take 5 mg by mouth daily.    . DiphenhydrAMINE HCl (BENADRYL PO) Take 25 mg by mouth at bedtime. As needed     . estradiol-norethindrone (ACTIVELLA) 1-0.5 MG tablet Take 1 tablet by mouth daily. 30 tablet 12  . fluconazole (DIFLUCAN) 150 MG tablet Take one  tablet.  Repeat in 72 hours if symptoms are not completely resolved. 2 tablet 0  . hydrocortisone (ANUSOL-HC) 2.5 % rectal cream Place 1 application rectally 4 (four) times daily. 30 g 0  . ibuprofen (ADVIL,MOTRIN) 800 MG tablet Take 1 tablet (800 mg total) by mouth every 8 (eight) hours as needed. 30 tablet 1  . lidocaine (XYLOCAINE) 5 % ointment Apply 1 application topically 4 (four) times daily as  needed. 1.25 g 0  . montelukast (SINGULAIR) 10 MG tablet Take 1 tablet (10 mg total) by mouth at bedtime. 90 tablet 4  . senna (SENOKOT) 8.6 MG TABS tablet Take 1 tablet (8.6 mg total) by mouth every 12 (twelve) hours as needed for moderate constipation. 60 each 0  . terconazole (TERAZOL 7) 0.4 % vaginal cream Apply externally twice daily 45 g 0   No current facility-administered medications on file prior to visit.     ALLERGIES: Nitrofurantoin  Family History  Problem Relation Age of Onset  . Hypertension Father   . Asthma Sister   . Diabetes Paternal Grandfather   . Diabetes Maternal Grandmother   . Breast cancer Maternal Grandmother   . Breast cancer Mother 14  . Lymphoma Paternal Grandmother        Non Hodgskins Lymphoma  . Anesthesia problems Neg Hx   . Hypotension Neg Hx   . Malignant hyperthermia Neg Hx   . Pseudochol deficiency Neg Hx     SH:  Divorced, non smoker  Review of Systems  Genitourinary:       Cramping  Musculoskeletal: Positive for back pain.  All other systems reviewed and are negative.   PHYSICAL EXAMINATION:    BP 120/78 (BP Location: Right Arm, Patient Position: Sitting, Cuff Size: Normal)   Pulse 64   Temp 98.1 F (36.7 C) (Oral)   Resp 16   Ht 5\' 8"  (1.727 m)   Wt 148 lb (67.1 kg)   LMP 07/03/2013   BMI 22.50 kg/m     General appearance: alert, cooperative and appears stated age CV:  Regular rate and rhythm Lungs:  clear to auscultation, no wheezes, rales or rhonchi, symmetric air entry Abdomen: soft, mild LUQ tenderness, no rebound or guarding, non-tender; bowel sounds normal; no masses,  no organomegaly Flank:  Mild left CVA tenderness Pelvic: External genitalia:  no lesions              Urethra:  normal appearing urethra with no masses, tenderness or lesions              Bartholins and Skenes: normal                 Vagina: normal appearing vagina with normal color and discharge, no lesions, sutures are coming out with one removed  today              Cervix: absent              Bimanual Exam:  Uterus:  uterus absent              Adnexa: no mass, fullness, tenderness              Anus:  no lesions  Tests today:  Neg u/a in office  Chaperone was present for exam.  Assessment: Left flank pain H/o right renal stones H/O pelvic post op hematoma with abscess, cuff dehiscence and second closure, resolving hematoma on ultrasound  Plan: Gyn exam is normal today.  Will call [facility] to see  if needs to be seen today.

## 2016-12-07 NOTE — Telephone Encounter (Signed)
Spoke with patient. Advised of results as seen below and review with Dr.Miller. Patient verbalizes understanding and will keep appointment as scheduled for 12/08/2016 at 9:15 am with [facility].  Routing to provider for final review. Patient agreeable to disposition. Will close encounter.

## 2016-12-07 NOTE — Telephone Encounter (Signed)
Stat lab results review by Dr Hyacinth MeekerMiller. CBC WNL. Call to patient. Left message to call back to [place] or [place].

## 2016-12-08 LAB — URINALYSIS, MICROSCOPIC ONLY: CASTS: NONE SEEN /LPF

## 2016-12-10 ENCOUNTER — Encounter: Payer: Self-pay | Admitting: Obstetrics & Gynecology

## 2016-12-10 ENCOUNTER — Ambulatory Visit (INDEPENDENT_AMBULATORY_CARE_PROVIDER_SITE_OTHER): Payer: PRIVATE HEALTH INSURANCE | Admitting: Obstetrics & Gynecology

## 2016-12-10 ENCOUNTER — Telehealth: Payer: Self-pay | Admitting: Obstetrics & Gynecology

## 2016-12-10 VITALS — BP 104/62 | HR 70 | Resp 14 | Wt 149.0 lb

## 2016-12-10 DIAGNOSIS — N949 Unspecified condition associated with female genital organs and menstrual cycle: Secondary | ICD-10-CM

## 2016-12-10 DIAGNOSIS — N898 Other specified noninflammatory disorders of vagina: Secondary | ICD-10-CM

## 2016-12-10 DIAGNOSIS — N9489 Other specified conditions associated with female genital organs and menstrual cycle: Secondary | ICD-10-CM

## 2016-12-10 NOTE — Telephone Encounter (Signed)
Patient says she is returning a call to Sally. °

## 2016-12-10 NOTE — Telephone Encounter (Signed)
Return call to patient. States she woke this am with increased cramping. Cramping increased from previous days. Minimal improvement with 600 mg Motirn. Denies bleeding or fever.  Wants to know if she can take warm bath to help with pain? What else can she do?  Saw urology. Was told back pain was muscular and given injection of Toradol and pain meds. No real help and back pain contiues to come and go.  States problem right now is cramping.   Advised will review with Dr Hyacinth MeekerMiller and call her back.

## 2016-12-10 NOTE — Telephone Encounter (Signed)
Reviewed with Dr Hyacinth MeekerMiller. Call back to patient. Advised she may take warm bath, use heating pad on low (precautions given) and increase Motrin to 800 mg Q 8 hours with food. PUS scheduled for  Tuesday 12-14-16 at 3 pm. Call back for increased pain, bleeding or change in status.  Patient then states she is just concerned due to level of pain and that nothing about this has gone right. Advised that she would call if there is any change, increased bleeding or pain but patient just not comfortable. Advised can have office visit for for recheck before weekend and patient prefers this. Advised to proceed on to office for work-in appoiontment.  Routing to provider for final review. Patient agreeable to disposition. Will close encounter.

## 2016-12-11 LAB — VAGINITIS/VAGINOSIS, DNA PROBE
Candida Species: NEGATIVE
Gardnerella vaginalis: POSITIVE — AB
TRICHOMONAS VAG: NEGATIVE

## 2016-12-13 NOTE — Progress Notes (Signed)
GYNECOLOGY  VISIT   HPI: 31 y.o. [id number] Divorced Caucasian female here for complaint of pelvic cramping.  Reports she saw urology last week and "that was worthless".  Left upper back pain is still present.  Denies vaginal bleeding.  CBC was normal earlier this week.  No fevers.  Has taken some motrin.  Does not want to take anything else although she was given prescription from provider at urology office.  GYNECOLOGIC HISTORY: Patient's last menstrual period was 07/03/2013. Contraception: hysterectomy  Patient Active Problem List   Diagnosis Date Noted  . Pelvic infection in female 10/15/2016  . Elevated white blood cell count 10/15/2016  . Endometriosis 10/04/2016  . Dysplasia of cervix, low grade (CIN 1) 09/26/2016  . Mild intermittent asthma 10/13/2015  . ADHD (attention deficit hyperactivity disorder), combined type 06/18/2015  . Frequent UTI 06/11/2015  . S/P endometrial ablation 07/10/2013  . History of biliary T-tube placement 07/10/2013    Past Medical History:  Diagnosis Date  . Asthma   . Complication of anesthesia   . GERD (gastroesophageal reflux disease)   . History of kidney infection during pregnancy   . PONV (postoperative nausea and vomiting)     Past Surgical History:  Procedure Laterality Date  . CYSTOSCOPY N/A 10/04/2016   Procedure: CYSTOSCOPY;  Surgeon: Jerene BearsMiller, Zitlali Primm S, MD;  [facility];  Service: Gynecology;  Laterality: N/A;  . ENDOMETRIAL ABLATION    . fatty tumor removed    . MANDIBLE FRACTURE SURGERY    . REPAIR VAGINAL CUFF N/A 10/16/2016   Procedure: EXAM UNDER ANESTHESIA, REPAIR OF VAGINAL CUFF DEHISCENCE, DRAINGAGE OF PELVIC ABCESS;  Surgeon: Romualdo BolkJertson, Jill Evelyn, MD;  Location: [facility];  Service: Gynecology;  Laterality: N/A;  . TOTAL LAPAROSCOPIC HYSTERECTOMY WITH SALPINGECTOMY N/A 10/04/2016   Procedure: HYSTERECTOMY TOTAL LAPAROSCOPIC WITH BILATERAL SALPINGO-OOPHORECTOMY AND CYSTOSCOPY PERITONEAL BIOPSY;  Surgeon: Jerene BearsMiller,  Erminie Foulks S, MD;  [facility];  Service: Gynecology;  Laterality: N/A;  . WISDOM TOOTH EXTRACTION  12 years ago    MEDS:   Current Outpatient Prescriptions on File Prior to Visit  Medication Sig Dispense Refill  . albuterol (PROVENTIL HFA;VENTOLIN HFA) 108 (90 Base) MCG/ACT inhaler Inhale 1-2 puffs into the lungs every 6 (six) hours as needed for wheezing or shortness of breath.    Marland Kitchen. albuterol (PROVENTIL) (2.5 MG/3ML) 0.083% nebulizer solution Take 2.5 mg by nebulization every 4 (four) hours as needed.     Marland Kitchen. amphetamine-dextroamphetamine (ADDERALL) 5 MG tablet Take 5 mg by mouth daily.    . DiphenhydrAMINE HCl (BENADRYL PO) Take 25 mg by mouth at bedtime. As needed     . estradiol-norethindrone (ACTIVELLA) 1-0.5 MG tablet Take 1 tablet by mouth daily. 30 tablet 12  . fluconazole (DIFLUCAN) 150 MG tablet Take one tablet.  Repeat in 72 hours if symptoms are not completely resolved. 2 tablet 0  . hydrocortisone (ANUSOL-HC) 2.5 % rectal cream Place 1 application rectally 4 (four) times daily. 30 g 0  . ibuprofen (ADVIL,MOTRIN) 800 MG tablet Take 1 tablet (800 mg total) by mouth every 8 (eight) hours as needed. 30 tablet 1  . lidocaine (XYLOCAINE) 5 % ointment Apply 1 application topically 4 (four) times daily as needed. 1.25 g 0  . montelukast (SINGULAIR) 10 MG tablet Take 1 tablet (10 mg total) by mouth at bedtime. 90 tablet 4  . senna (SENOKOT) 8.6 MG TABS tablet Take 1 tablet (8.6 mg total) by mouth every 12 (twelve) hours as needed for moderate  constipation. 60 each 0  . terconazole (TERAZOL 7) 0.4 % vaginal cream Apply externally twice daily 45 g 0  . VYVANSE 10 MG capsule Take 1 capsule by mouth daily.  0   No current facility-administered medications on file prior to visit.     ALLERGIES: Nitrofurantoin  Family History  Problem Relation Age of Onset  . Hypertension Father   . Asthma Sister   . Diabetes Paternal Grandfather   . Diabetes Maternal Grandmother   .  Breast cancer Maternal Grandmother   . Breast cancer Mother 40  . Lymphoma Paternal Grandmother        Non Hodgskins Lymphoma  . Anesthesia problems Neg Hx   . Hypotension Neg Hx   . Malignant hyperthermia Neg Hx   . Pseudochol deficiency Neg Hx     SH:  Divorced, non-smoker  Review of Systems  Genitourinary:       Pelvic cramping  All other systems reviewed and are negative.   PHYSICAL EXAMINATION:    BP 104/62 (BP Location: Right Arm, Patient Position: Sitting, Cuff Size: Normal)   Pulse 70   Resp 14   Wt 149 lb (67.6 kg)   LMP 07/03/2013   BMI 22.66 kg/m     General appearance: alert, cooperative and appears stated age Cardiovascular:  RRR, no M/R/G Lungs:  CTA bilaterally Abdomen: soft, non-tender; bowel sounds normal; no masses, no organomegaly  Pelvic: External genitalia:  no lesions              Urethra:  normal appearing urethra with no masses, tenderness or lesions              Bartholins and Skenes: normal                 Vagina: normal appearing vagina with normal color and whitish discharge noted, no lesions              Cervix: surgically absent, another suture removed today              Bimanual Exam:  Uterus:  uterus absent              Adnexa: no mass, fullness, tenderness  Chaperone was present for exam.  Assessment: Pelvic cramping in pt who had TLH/BSO/LOA/cystoscopy due to stage VI endometriosis Post op bleeding with subsequent pelvic abscesses and cuff dehiscence with subsequent EUA, abscess evacuation, vaginal drain placement and cuff closure H/o renal stones on CT  Plan: Plan repeat ultrasound next week as scheduled Affirm pending Pt reassured and advised to call with any new issues/concerns

## 2016-12-14 ENCOUNTER — Ambulatory Visit (INDEPENDENT_AMBULATORY_CARE_PROVIDER_SITE_OTHER): Payer: PRIVATE HEALTH INSURANCE | Admitting: Obstetrics & Gynecology

## 2016-12-14 ENCOUNTER — Ambulatory Visit (INDEPENDENT_AMBULATORY_CARE_PROVIDER_SITE_OTHER): Payer: PRIVATE HEALTH INSURANCE

## 2016-12-14 ENCOUNTER — Other Ambulatory Visit: Payer: Self-pay | Admitting: Obstetrics & Gynecology

## 2016-12-14 DIAGNOSIS — N739 Female pelvic inflammatory disease, unspecified: Secondary | ICD-10-CM

## 2016-12-14 MED ORDER — TINIDAZOLE 500 MG PO TABS: 500.0000 mg | ORAL_TABLET | Freq: Once | ORAL | 0 refills | Status: AC

## 2016-12-14 NOTE — Progress Notes (Signed)
GYNECOLOGY  VISIT   HPI: 31 y.o. G26P3013 Divorced Caucasian female here for repeat PUS due to pelvic cramping and to recheck prior post op hematoma/fluid collection.  Still having some cramping but has passed further sutures.  I've wondered if this was the cause of the cramping as the rest of her exam is completely normal.  Denies urinary or bowel issues.  Taking some motrin prn.  No fevers.  No vaginal discharge or bleeding.Marland Kitchen  GYNECOLOGIC HISTORY: Patient's last menstrual period was 07/03/2013. Contraception: hysterectomy Menopausal hormone therapy: none  Patient Active Problem List   Diagnosis Date Noted  . Pelvic infection in female 10/15/2016  . Elevated white blood cell count 10/15/2016  . Endometriosis 10/04/2016  . Dysplasia of cervix, low grade (CIN 1) 09/26/2016  . Mild intermittent asthma 10/13/2015  . ADHD (attention deficit hyperactivity disorder), combined type 06/18/2015  . Frequent UTI 06/11/2015  . S/P endometrial ablation 07/10/2013  . History of biliary T-tube placement 07/10/2013    Past Medical History:  Diagnosis Date  . Asthma   . Complication of anesthesia   . GERD (gastroesophageal reflux disease)   . History of kidney infection during pregnancy   . PONV (postoperative nausea and vomiting)     Past Surgical History:  Procedure Laterality Date  . CYSTOSCOPY N/A 10/04/2016   Procedure: CYSTOSCOPY;  Surgeon: Jerene Bears, MD;  Location: [facility];  Service: Gynecology;  Laterality: N/A;  . ENDOMETRIAL ABLATION    . fatty tumor removed    . MANDIBLE FRACTURE SURGERY    . REPAIR VAGINAL CUFF N/A 10/16/2016   Procedure: EXAM UNDER ANESTHESIA, REPAIR OF VAGINAL CUFF DEHISCENCE, DRAINGAGE OF PELVIC ABCESS;  Surgeon: Romualdo Bolk, MD;  Location: [facility];  Service: Gynecology;  Laterality: N/A;  . TOTAL LAPAROSCOPIC HYSTERECTOMY WITH SALPINGECTOMY N/A 10/04/2016   Procedure: HYSTERECTOMY TOTAL LAPAROSCOPIC WITH BILATERAL  SALPINGO-OOPHORECTOMY AND CYSTOSCOPY PERITONEAL BIOPSY;  Surgeon: Jerene Bears, MD;  Location: [facility];  Service: Gynecology;  Laterality: N/A;  . WISDOM TOOTH EXTRACTION  12 years ago    MEDS:   Current Outpatient Prescriptions on File Prior to Visit  Medication Sig Dispense Refill  . albuterol (PROVENTIL HFA;VENTOLIN HFA) 108 (90 Base) MCG/ACT inhaler Inhale 1-2 puffs into the lungs every 6 (six) hours as needed for wheezing or shortness of breath.    Marland Kitchen albuterol (PROVENTIL) (2.5 MG/3ML) 0.083% nebulizer solution Take 2.5 mg by nebulization every 4 (four) hours as needed.     Marland Kitchen amphetamine-dextroamphetamine (ADDERALL) 5 MG tablet Take 5 mg by mouth daily.    . DiphenhydrAMINE HCl (BENADRYL PO) Take 25 mg by mouth at bedtime. As needed     . estradiol-norethindrone (ACTIVELLA) 1-0.5 MG tablet Take 1 tablet by mouth daily. 30 tablet 12  . fluconazole (DIFLUCAN) 150 MG tablet Take one tablet.  Repeat in 72 hours if symptoms are not completely resolved. 2 tablet 0  . hydrocortisone (ANUSOL-HC) 2.5 % rectal cream Place 1 application rectally 4 (four) times daily. 30 g 0  . ibuprofen (ADVIL,MOTRIN) 800 MG tablet Take 1 tablet (800 mg total) by mouth every 8 (eight) hours as needed. 30 tablet 1  . lidocaine (XYLOCAINE) 5 % ointment Apply 1 application topically 4 (four) times daily as needed. 1.25 g 0  . montelukast (SINGULAIR) 10 MG tablet Take 1 tablet (10 mg total) by mouth at bedtime. 90 tablet 4  . senna (SENOKOT) 8.6 MG TABS tablet Take 1 tablet (8.6 mg total) by mouth  every 12 (twelve) hours as needed for moderate constipation. 60 each 0  . terconazole (TERAZOL 7) 0.4 % vaginal cream Apply externally twice daily 45 g 0  . VYVANSE 10 MG capsule Take 1 capsule by mouth daily.  0   No current facility-administered medications on file prior to visit.     ALLERGIES: Nitrofurantoin  Family History  Problem Relation Age of Onset  . Hypertension Father   . Asthma Sister     . Diabetes Paternal Grandfather   . Diabetes Maternal Grandmother   . Breast cancer Maternal Grandmother   . Breast cancer Mother [id number]  . Lymphoma Paternal Grandmother        Non Hodgskins Lymphoma  . Anesthesia problems Neg Hx   . Hypotension Neg Hx   . Malignant hyperthermia Neg Hx   . Pseudochol deficiency Neg Hx     SH:  Divorced, non smoker  Review of Systems  Genitourinary:       Pelvic cramping  All other systems reviewed and are negative.   PHYSICAL EXAMINATION:    LMP 07/03/2013     General appearance: alert, cooperative and appears stated age Abdomen: soft, non-tender; bowel sounds normal; no masses,  no organomegaly  Pelvic: External genitalia:  no lesions              Urethra:  normal appearing urethra with no masses, tenderness or lesions              Bartholins and Skenes: normal                 Vagina: normal appearing vagina with normal color and discharge, no lesions, two more sutures are loose and removed with wiping with scopette              Cervix: absent              Bimanual Exam:  Uterus:  uterus absent              Adnexa: no mass, fullness, tenderness              Anus:  no lesions  PUS today showed no hematoma or fluid collection.  Chaperone was present for exam.  Assessment: S/p TLH/BSO/LOA/cystoscopy due to stage IV endometriosis. Post op bleeding with hematoma formation, then infection with subsequent pelvic abscess and cuff dehiscence with EUA, abscess evacuation, vaginal drain placement and cuff closure  Plan: Recheck 1 month.  Pt is VERY CLEARLY advised nothing in vagina until after next exam.

## 2016-12-17 ENCOUNTER — Encounter: Payer: Self-pay | Admitting: Obstetrics & Gynecology

## 2016-12-23 ENCOUNTER — Other Ambulatory Visit: Payer: Self-pay

## 2016-12-23 ENCOUNTER — Other Ambulatory Visit: Payer: Self-pay | Admitting: Obstetrics & Gynecology

## 2017-01-06 ENCOUNTER — Encounter: Payer: Self-pay | Admitting: Obstetrics & Gynecology

## 2017-01-06 ENCOUNTER — Ambulatory Visit (INDEPENDENT_AMBULATORY_CARE_PROVIDER_SITE_OTHER): Payer: PRIVATE HEALTH INSURANCE | Admitting: Obstetrics & Gynecology

## 2017-01-06 VITALS — BP 106/72 | HR 74 | Resp 14 | Wt 145.0 lb

## 2017-01-06 DIAGNOSIS — Z9889 Other specified postprocedural states: Secondary | ICD-10-CM

## 2017-01-06 NOTE — Progress Notes (Addendum)
Post Operative Visit  Procedure: HYSTERECTOMY TOTAL LAPAROSCOPIC WITH BILATERAL SALPINGO-OOPHORECTOMY AND CYSTOSCOPY PERITONEAL BIOPSY  CYSTOSCOPY    Days Post-op: 3 months, 2 days  Subjective: Doing well.  Still having a little cramping.  Otherwise, no pain.  Normal bowel movements.  Bladder function normal.  No VB.    Objective: BP 106/72 (BP Location: Right Arm, Patient Position: Sitting, Cuff Size: Normal)   Pulse 74   Resp 14   Wt 145 lb (65.8 kg)   LMP 07/03/2013   BMI 22.05 kg/m   EXAM General: alert and cooperative Resp: clear to auscultation bilaterally Cardio: regular rate and rhythm, S1, S2 normal, no murmur, click, rub or gallop GI: soft, non-tender; bowel sounds normal; no masses,  no organomegaly and incision: well healed Extremities: extremities normal, atraumatic, no cyanosis or edema Vaginal Bleeding: none  GYN:  NAEFG, vaginal without lesions, cuff well healed but three sutures still present  Assessment: s/p TLH/BSO, cystoscopy due to stage IV endometriosis complicated by post op hemorrhage, subsequent pelvic abscess and cuff dehiscence, evacuation of abscess and cuff repair  Plan: Recheck 43month

## 2017-01-13 ENCOUNTER — Telehealth: Payer: Self-pay | Admitting: Obstetrics & Gynecology

## 2017-01-13 NOTE — Telephone Encounter (Signed)
Patient says she would like to speak with Kennon Rounds. No information given.

## 2017-01-13 NOTE — Telephone Encounter (Signed)
Her dosage has not changed and her hormonal status has not changed as her ovaries were removed at the time of surgery.  Would recommend switching to branded HRT like prempro 0.625/2.5mg  daily.  Ok to send to pharmacy and notify pt.

## 2017-01-13 NOTE — Telephone Encounter (Signed)
Return call to patient. States she is " struggling" and has been crying since Friday (now Thursday). Reports hot flashes for last 1-2 weeks and thinks may need medication adjusted. Has "same stress" work and personal stress issues. Not sleeping well, makes her more irritable and easily upset. Denies thoughts to harm self or others.   Currently on Activella and hesitant to consider antidepressant due to ADD meds. Does not want to take too many meds.  Advised will review with Dr Hyacinth Meeker for advice and call her back

## 2017-01-14 MED ORDER — CONJ ESTROG-MEDROXYPROGEST ACE 0.625-2.5 MG PO TABS: 1.0000 | ORAL_TABLET | Freq: Every day | ORAL | 2 refills | Status: DC

## 2017-01-14 NOTE — Telephone Encounter (Addendum)
Call to patient. Advised of recommendations from Dr Hyacinth Meeker. Patient agreeable to try new medication. Instructed to call back with positive/negative response. Rx to pharmacy as requested.   Encounter closed.

## 2017-02-11 ENCOUNTER — Ambulatory Visit (INDEPENDENT_AMBULATORY_CARE_PROVIDER_SITE_OTHER): Payer: PRIVATE HEALTH INSURANCE | Admitting: Obstetrics & Gynecology

## 2017-02-11 ENCOUNTER — Encounter: Payer: Self-pay | Admitting: Obstetrics & Gynecology

## 2017-02-11 VITALS — BP 110/70 | HR 76 | Resp 16 | Ht 68.0 in | Wt 146.0 lb

## 2017-02-11 DIAGNOSIS — Z9889 Other specified postprocedural states: Secondary | ICD-10-CM

## 2017-02-11 NOTE — Progress Notes (Deleted)
GYNECOLOGY  VISIT  CC:   ***  HPI: 31 y.o. 744P3013 Divorced Caucasian female here for ***.  GYNECOLOGIC HISTORY: Patient's last menstrual period was 07/03/2013. Contraception: hysterectomy  Menopausal hormone therapy: prempro   Patient Active Problem List   Diagnosis Date Noted  . Elevated white blood cell count 10/15/2016  . Endometriosis 10/04/2016  . Dysplasia of cervix, low grade (CIN 1) 09/26/2016  . Mild intermittent asthma 10/13/2015  . ADHD (attention deficit hyperactivity disorder), combined type 06/18/2015  . Frequent UTI 06/11/2015  . S/P endometrial ablation 07/10/2013  . History of biliary T-tube placement 07/10/2013    Past Medical History:  Diagnosis Date  . Asthma   . Complication of anesthesia   . GERD (gastroesophageal reflux disease)   . History of kidney infection during pregnancy   . PONV (postoperative nausea and vomiting)     Past Surgical History:  Procedure Laterality Date  . ENDOMETRIAL ABLATION    . fatty tumor removed    . MANDIBLE FRACTURE SURGERY    . WISDOM TOOTH EXTRACTION  12 years ago    MEDS:   Current Outpatient Medications on File Prior to Visit  Medication Sig Dispense Refill  . albuterol (PROVENTIL HFA;VENTOLIN HFA) 108 (90 Base) MCG/ACT inhaler Inhale 1-2 puffs into the lungs every 6 (six) hours as needed for wheezing or shortness of breath.    Marland Kitchen. albuterol (PROVENTIL) (2.5 MG/3ML) 0.083% nebulizer solution Take 2.5 mg by nebulization every 4 (four) hours as needed.     . DiphenhydrAMINE HCl (BENADRYL PO) Take 25 mg by mouth at bedtime. As needed     . estrogen, conjugated,-medroxyprogesterone (PREMPRO) 0.625-2.5 MG tablet Take 1 tablet by mouth daily. 30 tablet 2  . ibuprofen (ADVIL,MOTRIN) 800 MG tablet Take 1 tablet (800 mg total) by mouth every 8 (eight) hours as needed. 30 tablet 1  . lidocaine (XYLOCAINE) 5 % ointment Apply 1 application topically 4 (four) times daily as needed. 1.25 g 0  . montelukast (SINGULAIR) 10 MG  tablet Take 1 tablet (10 mg total) by mouth at bedtime. 90 tablet 4  . VYVANSE 10 MG capsule Take 1 capsule by mouth daily.  0   No current facility-administered medications on file prior to visit.     ALLERGIES: Nitrofurantoin  Family History  Problem Relation Age of Onset  . Hypertension Father   . Asthma Sister   . Diabetes Paternal Grandfather   . Diabetes Maternal Grandmother   . Breast cancer Maternal Grandmother   . Breast cancer Mother [id number]  . Lymphoma Paternal Grandmother        Non Hodgskins Lymphoma  . Anesthesia problems Neg Hx   . Hypotension Neg Hx   . Malignant hyperthermia Neg Hx   . Pseudochol deficiency Neg Hx     SH:  ***  ROS  PHYSICAL EXAMINATION:    LMP 07/03/2013     General appearance: alert, cooperative and appears stated age Neck: no adenopathy, supple, symmetrical, trachea midline and thyroid {CHL AMB PHY EX THYROID NORM DEFAULT:[phone]::"normal to inspection and palpation"} CV:  {Exam; heart brief:31539} Lungs:  {pe lungs ob:314451::"clear to auscultation, no wheezes, rales or rhonchi, symmetric air entry"} Breasts: {Exam; breast:13139::"normal appearance, no masses or tenderness"} Abdomen: soft, non-tender; bowel sounds normal; no masses,  no organomegaly  Pelvic: External genitalia:  no lesions              Urethra:  normal appearing urethra with no masses, tenderness or lesions  Bartholins and Skenes: normal                 Vagina: normal appearing vagina with normal color and discharge, no lesions              Cervix: {CHL AMB PHY EX CERVIX NORM [phone]::"no lesions"}              Bimanual Exam:  Uterus:  {CHL AMB PHY EX UTERUS NORM [phone]::"normal size, contour, position, consistency, mobility, non-tender"}              Adnexa: {CHL AMB PHY EX ADNEXA NO MASS DEFAULT:[phone]::"no mass, fullness, tenderness"}              Rectovaginal: {yes no:314532}.  Confirms.              Anus:  normal sphincter  tone, no lesions  Chaperone was present for exam.  Assessment: ***  Plan: ***   ~{NUMBERS; -10-45 JOINT ROM:10287} minutes spent with patient >50% of time was in face to face discussion of above.

## 2017-02-11 NOTE — Progress Notes (Signed)
Post Operative Visit  Procedure: HYSTERECTOMY TOTAL LAPAROSCOPIC WITH BILATERAL SALPINGO-OOPHORECTOMY AND CYSTOSCOPY PERITONEAL BIOPSY      CYSTOSCOPY              Days Post-op: 4 months, 7 days   Subjective: Doing well.  Stressed about work changed.  Denies pain.  Taking no medication for pain.  Is being treated for anxiety at work.  Had some nausea at first with this but this has mostly resolved.  Having insomnia with it which is a possible side effect.  Reviewed peak action timing and advised to take in the morning and not at night to see if this will help.    No voiding issues.  No constipation.  Objective: BP 110/70 (BP Location: Right Arm, Patient Position: Sitting, Cuff Size: Normal)   Pulse 76   Resp 16   Ht 5\' 8"  (1.727 m)   Wt 146 lb (66.2 kg)   LMP 07/03/2013   BMI 22.20 kg/m   EXAM General: alert and cooperative GI: soft, non-tender; bowel sounds normal; no masses,  no organomegaly and incision: well healed Extremities: extremities normal, atraumatic, no cyanosis or edema Vaginal Bleeding: none Gyn:  NAEFG, vagina without lesions, cuff with three sutures that are loose and can be removed.  No other sutures/findings present  Assessment: s/p TLH/BSO/LOA/cystoscopy due to state IV endometriosis.  Post bleeding with hematoma formation, then infection with subsequent pelvic abscess and cuff dehiscence treated with EUA, abscess evacuation, vaginal drain placement and cuff closure.  Plan: Pt may resume all normal activities including intercourse.  Will follow up with me one year or if has new issues/problems.

## 2017-04-11 ENCOUNTER — Other Ambulatory Visit: Payer: Self-pay | Admitting: *Deleted

## 2017-04-11 MED ORDER — ESTRADIOL-NORETHINDRONE ACET 1-0.5 MG PO TABS: 1.0000 | ORAL_TABLET | Freq: Every day | ORAL | 4 refills | Status: DC

## 2017-04-11 MED ORDER — MONTELUKAST SODIUM 10 MG PO TABS: 10.0000 mg | ORAL_TABLET | Freq: Every day | ORAL | 4 refills | Status: AC

## 2017-04-11 NOTE — Telephone Encounter (Signed)
Medication refill request: montelukast  Last OV:  02/11/17 SM  Next AEX: 02/13/18  Last MMG (if hormonal medication request): 07/07/09 Diagnostic Right. BIRADS2:Benign. f/u age 32. Refill authorized: 08/16/16 #90, 4RF. Today, please advise.   Medication refill request: mimvey  Refill authorized: Please advise.   Spoke with patient. Patient requests refills now be [facility].

## 2017-08-02 ENCOUNTER — Telehealth: Payer: Self-pay | Admitting: Obstetrics & Gynecology

## 2017-08-02 NOTE — Telephone Encounter (Signed)
Patient says her hormones are off and would like an appointment with Dr Hyacinth Meeker.

## 2017-08-02 NOTE — Telephone Encounter (Signed)
Spoke with patient. Patient states that she has been seeing her PCP and a therapist for increased crying and not being able to sleep. Patient is taking Vyvanse and was recently prescribed Trazodone to help with sleeping. Reports PCP and therapist think this may be related to hormones. Patient is taking Mimvey 1-0.5 mg tablet daily. Would like to come in to speak with Dr.Miller about hormones. Declines appointment for 08/04/2017. Appointment scheduled for 08/05/2017 at 10:15 am with Dr.Miller. Patient is agreeable to date and time.  Routing to provider for final review. Patient agreeable to disposition. Will close encounter.

## 2017-08-05 ENCOUNTER — Ambulatory Visit (INDEPENDENT_AMBULATORY_CARE_PROVIDER_SITE_OTHER): Payer: BLUE CROSS/BLUE SHIELD | Admitting: Obstetrics & Gynecology

## 2017-08-05 ENCOUNTER — Other Ambulatory Visit: Payer: Self-pay

## 2017-08-05 ENCOUNTER — Encounter: Payer: Self-pay | Admitting: Obstetrics & Gynecology

## 2017-08-05 VITALS — BP 118/76 | HR 82 | Resp 16 | Ht 68.0 in | Wt 141.0 lb

## 2017-08-05 DIAGNOSIS — F39 Unspecified mood [affective] disorder: Secondary | ICD-10-CM

## 2017-08-05 DIAGNOSIS — G4709 Other insomnia: Secondary | ICD-10-CM

## 2017-08-05 DIAGNOSIS — E349 Endocrine disorder, unspecified: Secondary | ICD-10-CM

## 2017-08-05 NOTE — Progress Notes (Addendum)
GYNECOLOGY  VISIT  CC:   Crying and insomnia  HPI: 32 y.o. G39P3013 Divorced Caucasian female here to discuss hormonal issues, increased crying, and difficulty sleeping.  Reports this started in January when long term relationship ended.  Feels this must have a hormonal component as this is how she felt last summer after her hysterectomy involved BSO due to stage IV endometriosis.    She has been seeing a therapist, Victorino December , who feels she has a "hormonal imbalance".  Provider is at [facility].  Has been seeing her for about a month.  Referred to her from Sherlie Ban, NP, at [facility].  Is also having issues with her ADHD medication.  Currently on Vyvanse.  Dosage has been adjusted.  Pt is aware insomnia could be from this but doesn't seem to be getting anywhere with dosage adjustment.  For sleep, Trazodone has been started but she is taking 1/4 of a  tablet which she is aware is a very low dose.  Focus has suffered as well.  Works at [employer].  Working 40 hours as week.  Working at [employer] as well.  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  Feels like she needs a vacation.  Going to a wedding in [place] soon.    Pt was doing very well on Mimvy 1/0.5 when I last saw her.  Reports insurance was switched and she is now getting medication from mail order.  Has current rx order in her phone.  She is receiving Amabelz 1/0.5 from mail order and not Mimvy.     Does not want to be on an anti-depressant.  Has tried both Lexapro and Paxil and could not tolerate due to side effects.   GYNECOLOGIC HISTORY: Patient's last menstrual period was 07/03/2013. Contraception: hysterectomy  Menopausal hormone therapy: mimvey 1-0.5 daily  Patient Active Problem List   Diagnosis Date Noted  . Elevated white blood cell count 10/15/2016  . Endometriosis 10/04/2016  . Dysplasia of cervix, low grade (CIN 1) 09/26/2016  . Mild intermittent asthma 10/13/2015  . ADHD  (attention deficit hyperactivity disorder), combined type 06/18/2015  . Frequent UTI 06/11/2015  . S/P endometrial ablation 07/10/2013  . History of biliary T-tube placement 07/10/2013    Past Medical History:  Diagnosis Date  . Asthma   . Complication of anesthesia   . GERD (gastroesophageal reflux disease)   . History of kidney infection during pregnancy   . PONV (postoperative nausea and vomiting)     Past Surgical History:  Procedure Laterality Date  . CYSTOSCOPY N/A 10/04/2016   Procedure: CYSTOSCOPY;  Surgeon: Jerene Bears, MD;  Location: [facility];  Service: Gynecology;  Laterality: N/A;  . ENDOMETRIAL ABLATION    . fatty tumor removed    . MANDIBLE FRACTURE SURGERY    . REPAIR VAGINAL CUFF N/A 10/16/2016   Procedure: EXAM UNDER ANESTHESIA, REPAIR OF VAGINAL CUFF DEHISCENCE, DRAINGAGE OF PELVIC ABCESS;  Surgeon: Romualdo Bolk, MD;  Location: [facility];  Service: Gynecology;  Laterality: N/A;  . TOTAL LAPAROSCOPIC HYSTERECTOMY WITH SALPINGECTOMY N/A 10/04/2016   Procedure: HYSTERECTOMY TOTAL LAPAROSCOPIC WITH BILATERAL SALPINGO-OOPHORECTOMY AND CYSTOSCOPY PERITONEAL BIOPSY;  Surgeon: Jerene Bears, MD;  Location: [facility];  Service: Gynecology;  Laterality: N/A;  . WISDOM TOOTH EXTRACTION  12 years ago    MEDS:   Current Outpatient Medications on File Prior to Visit  Medication Sig Dispense Refill  . albuterol (PROVENTIL HFA;VENTOLIN HFA) 108 (90 Base)  MCG/ACT inhaler Inhale 1-2 puffs into the lungs every 6 (six) hours as needed for wheezing or shortness of breath.    . DiphenhydrAMINE HCl (BENADRYL PO) Take 25 mg by mouth at bedtime. As needed     . estradiol-norethindrone (MIMVEY) 1-0.5 MG tablet Take 1 tablet by mouth daily. 90 tablet 4  . lisdexamfetamine (VYVANSE) 10 MG capsule Take 1 capsule by mouth daily.    Marland Kitchen lisdexamfetamine (VYVANSE) 20 MG capsule Take 20 mg daily by mouth.    . montelukast (SINGULAIR) 10 MG tablet Take 1  tablet (10 mg total) by mouth at bedtime. 90 tablet 4  . traZODone (DESYREL) 50 MG tablet Take 0.25 tablets by mouth at bedtime.    Marland Kitchen albuterol (PROVENTIL) (2.5 MG/3ML) 0.083% nebulizer solution Take 2.5 mg by nebulization every 4 (four) hours as needed.      No current facility-administered medications on file prior to visit.     ALLERGIES: Nitrofurantoin  Family History  Problem Relation Age of Onset  . Hypertension Father   . Asthma Sister   . Diabetes Paternal Grandfather   . Diabetes Maternal Grandmother   . Breast cancer Maternal Grandmother   . Breast cancer Mother 11  . Lymphoma Paternal Grandmother        Non Hodgskins Lymphoma  . Anesthesia problems Neg Hx   . Hypotension Neg Hx   . Malignant hyperthermia Neg Hx   . Pseudochol deficiency Neg Hx     SH:  Divorced, non smoker  Review of Systems  Neurological: Positive for headaches.  All other systems reviewed and are negative.   PHYSICAL EXAMINATION:    BP 118/76 (BP Location: Right Arm, Patient Position: Sitting, Cuff Size: Normal)   Pulse 82   Resp 16   Ht  (1.727 m)   Wt 141 lb (64 kg)   LMP 07/03/2013   BMI 21.44 kg/m     General appearance: alert, cooperative and appears stated age No physical exam performed.  Beck depression inventory was done with her today.  Scored 16.  Mood disturbance/on cutoff with mild depression  Beck anxiety inventory was done as well.  Scored 10.  Assessment: Possible hormonal issues related to change in HRT ADHD with difficulty with Vyvanse Insomnia  Plan: Advised pt to increase HRT to two tablets daily right now.  Will see if mail order can supply Mimvy.  Estradiol and progesterone levels obtained today. Encouraged her to increase her Trazodone by 1/4 every week. May benefit from seeing provider at [facility]   ~30 minutes spent with patient >50% of time was in face to face discussion of above.

## 2017-08-06 LAB — PROGESTERONE: PROGESTERONE: 0.1 ng/mL

## 2017-08-06 LAB — ESTRADIOL: Estradiol: 35.6 pg/mL

## 2017-08-09 ENCOUNTER — Telehealth: Payer: Self-pay | Admitting: *Deleted

## 2017-08-09 NOTE — Telephone Encounter (Signed)
Notes recorded by Leda Min, RN on 08/09/2017 at 12:10 PM EDT Spoke with patient, advised as seen below per Dr. Hyacinth Meeker. Patient states she did increase her dosage over the weekend and feels better than she has. Patient request for the increased dosage to [facility], would like to continue current med at increased dosage. Currently receiving Amabelz 1/0.5. Advised will review with Dr. Hyacinth Meeker and return call. See telephone encounter dated 5/7 to review with provider.     Dr. Hyacinth Meeker -please advise on RX?

## 2017-08-09 NOTE — Telephone Encounter (Signed)
-----   Message from Jerene Bears, MD sent at 08/08/2017 11:26 PM EDT ----- Please let pt know estradiol level was in middle of range but progesterone was very low.  We did contact her mail order company and they cannot supply Mimvey 1/0.5.  Does she want this sent back to local pharmacy or did she increase the dosage of what she is taking over the weekend to see if this would help?  Would suggest going back to [facility] for 2-4 weeks to see if this is what is needed.

## 2017-08-10 MED ORDER — ESTRADIOL-NORETHINDRONE ACET 1-0.5 MG PO TABS: 1.0000 | ORAL_TABLET | Freq: Two times a day (BID) | ORAL | 4 refills | Status: DC

## 2017-08-10 NOTE — Telephone Encounter (Signed)
Kirsten Fischer Rx sent to [facility], patient notified. Verbalizes understanding and is agreeable. Encounter closed.

## 2017-08-10 NOTE — Telephone Encounter (Signed)
Ok to send in order for Amabelz 1/0.5mg  tabs 1 BID.  #180 (90 day supply)/4RF.  Thanks for update.

## 2017-08-11 ENCOUNTER — Telehealth: Payer: Self-pay

## 2017-08-11 NOTE — Telephone Encounter (Signed)
Received medication clarification fax from [facility] regarding Amabelz 1-0.5mg . They want to clarify directions should be 1 tablet bid. Faxed form back to [facility] completed by Dr.Miller stating patient needs Amabelz bid due to age and history of BSO with Hysterectomy and stage IV endometriosis--see form in Epic. Faxed to [phone].

## 2017-09-12 ENCOUNTER — Encounter

## 2017-09-12 ENCOUNTER — Ambulatory Visit: Payer: PRIVATE HEALTH INSURANCE | Admitting: Obstetrics & Gynecology

## 2017-10-26 DIAGNOSIS — E8940 Asymptomatic postprocedural ovarian failure: Secondary | ICD-10-CM | POA: Insufficient documentation

## 2018-02-09 NOTE — Progress Notes (Signed)
32 y.o. Kirsten Fischer Divorced White or Caucasian Not Hispanic or Latino female here for annual exam.  She has had issues with finding the right HRT. She is now on transdermal estrogen and prometrium (on progesterone secondary to her h/o severe endometriosis). Night sweats are still kind of bad, up 1-2 x a night. Hot flashes are much better on this dose of HRT. She has increased from the 0.025 mg patch to the 0.075 mg patch. Feels her depression is getting better. She wants to stay on this dose currently (being managed by Endocrinologist). She has had a cold for the last week, now feels she has a sinus infection. She got a shot of steroids on Wednesday and is on prednisone. She has green mucous when she blows her nose. She is coughing up a little. She c/o pain in her sinuses maxillary region bilaterally. Feels she has a sinus infection. No fevers.  Sexually active, new partner. No dyspareunia.      Patient's last menstrual period was 07/03/2013.          Sexually active: Yes.    The current method of family planning is status post hysterectomy.    Exercising: Yes.    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 HR HPV: +Detected. 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 BIRADS2:Benign. f/u age 23. TDaP:  03/2011  Gardasil: No   reports that she quit smoking about 4 years ago. Her smoking use included cigarettes. She has never used smokeless tobacco. She reports that she drinks alcohol. She reports that she does not use drugs. Rare ETOH. 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 Kids 12, 9 and 6 (all boys). Oldest sons father is in prison, she shares custody of the other 2 boys with their Dad.   Past Medical History:  Diagnosis Date  . Asthma   . Complication of anesthesia   . Endometriosis   . GERD (gastroesophageal  reflux disease)   . History of kidney infection during pregnancy   . PONV (postoperative nausea and vomiting)     Past Surgical History:  Procedure Laterality Date  . CYSTOSCOPY N/A 10/04/2016   Procedure: CYSTOSCOPY;  Surgeon: Jerene Bears, MD;  Location: [facility];  Service: Gynecology;  Laterality: N/A;  . ENDOMETRIAL ABLATION    . fatty tumor removed    . MANDIBLE FRACTURE SURGERY    . REPAIR VAGINAL CUFF N/A 10/16/2016   Procedure: EXAM UNDER ANESTHESIA, REPAIR OF VAGINAL CUFF DEHISCENCE, DRAINGAGE OF PELVIC ABCESS;  Surgeon: Romualdo Bolk, MD;  Location: [facility];  Service: Gynecology;  Laterality: N/A;  . TOTAL LAPAROSCOPIC HYSTERECTOMY WITH SALPINGECTOMY N/A 10/04/2016   Procedure: HYSTERECTOMY TOTAL LAPAROSCOPIC WITH BILATERAL SALPINGO-OOPHORECTOMY AND CYSTOSCOPY PERITONEAL BIOPSY;  Surgeon: Jerene Bears, MD;  Location: [facility];  Service: Gynecology;  Laterality: N/A;  . WISDOM TOOTH EXTRACTION  12 years ago    Current Outpatient Medications  Medication Sig Dispense Refill  . albuterol (PROVENTIL HFA;VENTOLIN HFA) 108 (90 Base) MCG/ACT inhaler Inhale 1-2 puffs into the lungs every 6 (six) hours as needed for wheezing or shortness of breath.    . estradiol (VIVELLE-DOT) 0.075 MG/24HR Place 1 patch onto the skin 2 (two) times a week.    . lisdexamfetamine (VYVANSE) 10 MG capsule Take 1 capsule  by mouth daily.    Marland Kitchen lisdexamfetamine (VYVANSE) 20 MG capsule Take 20 mg daily by mouth.    . montelukast (SINGULAIR) 10 MG tablet Take 1 tablet (10 mg total) by mouth at bedtime. 90 tablet 4  . progesterone (PROMETRIUM) 100 MG capsule Take 100 mg by mouth daily.    Marland Kitchen albuterol (PROVENTIL) (2.5 MG/3ML) 0.083% nebulizer solution Take 2.5 mg by nebulization every 4 (four) hours as needed.      No current facility-administered medications for this visit.     Family History  Problem Relation Age of Onset  . Hypertension Father   . Asthma Sister   .  Diabetes Paternal Grandfather   . Diabetes Maternal Grandmother   . Breast cancer Maternal Grandmother   . Breast cancer Mother 52  . Lymphoma Paternal Grandmother        Non Hodgskins Lymphoma  . Anesthesia problems Neg Hx   . Hypotension Neg Hx   . Malignant hyperthermia Neg Hx   . Pseudochol deficiency Neg Hx   Mom was 20 with breast cancer (has 3 brothers, no sisters). Mom and MGM with negative genetic testing.  MGM has had breast cancer, menopausal, has had bilateral mastectomies.  MGA with breast cancer, menopausal  MGG with breast cancer.   Review of Systems  Constitutional: Negative.   HENT: Negative.   Eyes: Negative.   Respiratory: Negative.   Cardiovascular: Negative.   Gastrointestinal: Negative.   Endocrine: Negative.   Genitourinary: Negative.   Musculoskeletal: Negative.   Skin: Negative.   Allergic/Immunologic: Negative.   Neurological: Negative.   Hematological: Negative.   Psychiatric/Behavioral: Negative.     Exam:   BP 118/78 (BP Location: Right Arm, Patient Position: Sitting, Cuff Size: Normal)   Pulse 72   Ht 5\' 8"  (1.727 m)   Wt 149 lb 3.2 oz (67.7 kg)   LMP 07/03/2013   BMI 22.69 kg/m   Weight change: @WEIGHTCHANGE @ Height:   Height: 5\' 8"  (172.7 cm)  Ht Readings from Last 3 Encounters:  02/13/18 5\' 8"  (1.727 m)  08/05/17 5\' 8"  (1.727 m)  02/11/17 5\' 8"  (1.727 m)    General appearance: alert, cooperative and appears stated age Head: Normocephalic, without obvious abnormality, atraumatic Tender in her left maxillary sinus Neck: no adenopathy, supple, symmetrical, trachea midline and thyroid normal to inspection and palpation Lungs: clear to auscultation bilaterally Cardiovascular: regular rate and rhythm Breasts: increased nodularity in the upper outer quadrant of the left breast compared to the right. The left breast is slightly larger.  Abdomen: soft, non-tender; non distended,  no masses,  no organomegaly Extremities: extremities normal,  atraumatic, no cyanosis or edema Skin: Skin color, texture, turgor normal. No rashes or lesions Lymph nodes: Cervical, supraclavicular, and axillary nodes normal. No abnormal inguinal nodes palpated Neurologic: Grossly normal   Pelvic: External genitalia:  no lesions              Urethra:  normal appearing urethra with no masses, tenderness or lesions              Bartholins and Skenes: normal                 Vagina: normal appearing vagina with normal color and discharge, no lesions              Cervix: absent               Bimanual Exam:  Uterus:  uterus absent  Adnexa: no mass, fullness, tenderness               Rectovaginal: Confirms               Anus:  normal sphincter tone, no lesions  Chaperone was present for exam.  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

## 2018-02-13 ENCOUNTER — Other Ambulatory Visit (HOSPITAL_COMMUNITY)
Admission: RE | Admit: 2018-02-13 | Discharge: 2018-02-13 | Disposition: A | Discharge status: Home / Self Care | Payer: BLUE CROSS/BLUE SHIELD | Source: Ambulatory Visit | Attending: Obstetrics and Gynecology | Admitting: Obstetrics and Gynecology

## 2018-02-13 ENCOUNTER — Encounter: Payer: Self-pay | Admitting: Obstetrics and Gynecology

## 2018-02-13 ENCOUNTER — Ambulatory Visit (INDEPENDENT_AMBULATORY_CARE_PROVIDER_SITE_OTHER): Payer: BLUE CROSS/BLUE SHIELD | Admitting: Obstetrics and Gynecology

## 2018-02-13 ENCOUNTER — Other Ambulatory Visit: Payer: Self-pay

## 2018-02-13 ENCOUNTER — Other Ambulatory Visit: Payer: Self-pay | Admitting: Obstetrics and Gynecology

## 2018-02-13 ENCOUNTER — Ambulatory Visit: Payer: Self-pay | Admitting: Obstetrics & Gynecology

## 2018-02-13 VITALS — BP 118/78 | HR 72 | Ht 68.0 in | Wt 149.2 lb

## 2018-02-13 DIAGNOSIS — Z79890 Hormone replacement therapy (postmenopausal): Secondary | ICD-10-CM | POA: Diagnosis not present

## 2018-02-13 DIAGNOSIS — Z90710 Acquired absence of both cervix and uterus: Secondary | ICD-10-CM

## 2018-02-13 DIAGNOSIS — N6321 Unspecified lump in the left breast, upper outer quadrant: Secondary | ICD-10-CM

## 2018-02-13 DIAGNOSIS — Z90722 Acquired absence of ovaries, bilateral: Secondary | ICD-10-CM

## 2018-02-13 DIAGNOSIS — Z9079 Acquired absence of other genital organ(s): Secondary | ICD-10-CM | POA: Diagnosis not present

## 2018-02-13 DIAGNOSIS — Z1272 Encounter for screening for malignant neoplasm of vagina: Secondary | ICD-10-CM

## 2018-02-13 DIAGNOSIS — Z113 Encounter for screening for infections with a predominantly sexual mode of transmission: Secondary | ICD-10-CM

## 2018-02-13 DIAGNOSIS — Z8742 Personal history of other diseases of the female genital tract: Secondary | ICD-10-CM

## 2018-02-13 DIAGNOSIS — J0100 Acute maxillary sinusitis, unspecified: Secondary | ICD-10-CM

## 2018-02-13 DIAGNOSIS — Z01419 Encounter for gynecological examination (general) (routine) without abnormal findings: Secondary | ICD-10-CM | POA: Diagnosis not present

## 2018-02-13 MED ORDER — AMOXICILLIN 875 MG PO TABS: 875.0000 mg | ORAL_TABLET | Freq: Two times a day (BID) | ORAL | 0 refills | Status: DC

## 2018-02-13 NOTE — Progress Notes (Signed)
Patient scheduled while in office for bilateral diagnostic MMG and left breast US, if needed, at [facility] on 02/18/18 at 8:20am, arriving at 8am. Patient verbalizes understanding and is agreeable.

## 2018-02-14 LAB — HIV ANTIBODY (ROUTINE TESTING W REFLEX): HIV Screen 4th Generation wRfx: NONREACTIVE

## 2018-02-14 LAB — RPR: RPR Ser Ql: NONREACTIVE

## 2018-02-15 ENCOUNTER — Ambulatory Visit
Admission: RE | Admit: 2018-02-15 | Discharge: 2018-02-15 | Disposition: A | Discharge status: Home / Self Care | Payer: PRIVATE HEALTH INSURANCE | Source: Ambulatory Visit | Attending: Obstetrics and Gynecology | Admitting: Obstetrics and Gynecology

## 2018-02-15 ENCOUNTER — Ambulatory Visit
Admission: RE | Admit: 2018-02-15 | Discharge: 2018-02-15 | Disposition: A | Discharge status: Home / Self Care | Payer: BLUE CROSS/BLUE SHIELD | Source: Ambulatory Visit | Attending: Obstetrics and Gynecology | Admitting: Obstetrics and Gynecology

## 2018-02-15 ENCOUNTER — Other Ambulatory Visit: Payer: Self-pay | Admitting: Obstetrics and Gynecology

## 2018-02-15 DIAGNOSIS — N6310 Unspecified lump in the right breast, unspecified quadrant: Secondary | ICD-10-CM

## 2018-02-15 DIAGNOSIS — N6321 Unspecified lump in the left breast, upper outer quadrant: Secondary | ICD-10-CM

## 2018-02-17 LAB — CYTOLOGY - PAP
Chlamydia: NEGATIVE
DIAGNOSIS: NEGATIVE
HPV (WINDOPATH): NOT DETECTED
NEISSERIA GONORRHEA: NEGATIVE
Trichomonas: NEGATIVE

## 2018-05-17 ENCOUNTER — Ambulatory Visit: Payer: BLUE CROSS/BLUE SHIELD | Admitting: Obstetrics and Gynecology

## 2018-05-24 NOTE — Progress Notes (Signed)
GYNECOLOGY  VISIT   HPI: 33 y.o.   Divorced White or Caucasian Not Hispanic or Latino  female   [phone] with Patient's last menstrual period was 07/03/2013.   here for   3 month breast recheck. The patient was noted to have increased nodularity in her left breast on annual exam in 11/19.   Her mother, her MGM, and GGM all with breast cancer. Mom was in her 9's, she thinks her GGM was in her 95's or 12's. Mom with negative genetic testing 3-4 years ago. Mom is local.  She is changing her estrogen patch every 4 days, by the 4th day she is tired and has a headache. She is on the 0.075 mg estradiol patch and Prometrium (secondary to severe endometriosis).  Breast imaging:  IMPRESSION: 1. No abnormalities identified at site of palpable concern in the upper-outer left breast.  2. Probably benign asymmetry seen in the posterior central right breast likely corresponding with a cluster of microcysts seen sonographically.  RECOMMENDATION: 1. Diagnostic mammography of the right breast with ultrasound in 6 months.  GYNECOLOGIC HISTORY: Patient's last menstrual period was 07/03/2013. Contraception: hysterectomy  Menopausal hormone therapy: vivelle- dot, prometrium         OB History    Gravida  4   Para  3   Term  3   Preterm  0   AB  1   Living  3     SAB  1   TAB  0   Ectopic  0   Multiple  0   Live Births  3              Patient Active Problem List   Diagnosis Date Noted  . Elevated white blood cell count 10/15/2016  . Endometriosis 10/04/2016  . Dysplasia of cervix, low grade (CIN 1) 09/26/2016  . Mild intermittent asthma 10/13/2015  . ADHD (attention deficit hyperactivity disorder), combined type 06/18/2015  . Frequent UTI 06/11/2015  . S/P endometrial ablation 07/10/2013  . History of biliary T-tube placement 07/10/2013    Past Medical History:  Diagnosis Date  . Asthma   . Complication of anesthesia   . Endometriosis   . GERD (gastroesophageal  reflux disease)   . History of kidney infection during pregnancy   . PONV (postoperative nausea and vomiting)     Past Surgical History:  Procedure Laterality Date  . CYSTOSCOPY N/A 10/04/2016   Procedure: CYSTOSCOPY;  Surgeon: Jerene Bears, MD;  Location: [facility];  Service: Gynecology;  Laterality: N/A;  . ENDOMETRIAL ABLATION    . fatty tumor removed    . MANDIBLE FRACTURE SURGERY    . REPAIR VAGINAL CUFF N/A 10/16/2016   Procedure: EXAM UNDER ANESTHESIA, REPAIR OF VAGINAL CUFF DEHISCENCE, DRAINGAGE OF PELVIC ABCESS;  Surgeon: Romualdo Bolk, MD;  Location: [facility];  Service: Gynecology;  Laterality: N/A;  . TOTAL LAPAROSCOPIC HYSTERECTOMY WITH SALPINGECTOMY N/A 10/04/2016   Procedure: HYSTERECTOMY TOTAL LAPAROSCOPIC WITH BILATERAL SALPINGO-OOPHORECTOMY AND CYSTOSCOPY PERITONEAL BIOPSY;  Surgeon: Jerene Bears, MD;  Location[facility];  Service: Gynecology;  Laterality: N/A;  . WISDOM TOOTH EXTRACTION  12 years ago    Current Outpatient Medications  Medication Sig Dispense Refill  . albuterol (PROVENTIL HFA;VENTOLIN HFA) 108 (90 Base) MCG/ACT inhaler Inhale 1-2 puffs into the lungs every 6 (six) hours as needed for wheezing or shortness of breath.    . estradiol (VIVELLE-DOT) 0.075 MG/24HR Place 1 patch onto the skin 2 (two) times a  week.    . montelukast (SINGULAIR) 10 MG tablet Take 1 tablet (10 mg total) by mouth at bedtime. 90 tablet 4  . progesterone (PROMETRIUM) 100 MG capsule Take 100 mg by mouth daily.    Marland Kitchen VYVANSE 30 MG capsule TAKE 1 CAPSULE (30 MG TOTAL) BY MOUTH EVERY MORNING FOR 30 DAYS.    Marland Kitchen albuterol (PROVENTIL) (2.5 MG/3ML) 0.083% nebulizer solution Take 2.5 mg by nebulization every 4 (four) hours as needed.      No current facility-administered medications for this visit.      ALLERGIES: Nitrofurantoin and Atomoxetine  Family History  Problem Relation Age of Onset  . Hypertension Father   . Asthma Sister   . Diabetes  Paternal Grandfather   . Diabetes Maternal Grandmother   . Breast cancer Maternal Grandmother   . Breast cancer Mother 37  . Lymphoma Paternal Grandmother        Non Hodgskins Lymphoma  . Anesthesia problems Neg Hx   . Hypotension Neg Hx   . Malignant hyperthermia Neg Hx   . Pseudochol deficiency Neg Hx     Social History   Socioeconomic History  . Marital status: Divorced    Spouse name: Not on file  . Number of children: Not on file  . Years of education: Not on file  . Highest education level: Not on file  Occupational History  . Not on file  Social Needs  . Financial resource strain: Not on file  . Food insecurity:    Worry: Not on file    Inability: Not on file  . Transportation needs:    Medical: Not on file    Non-medical: Not on file  Tobacco Use  . Smoking status: Former Smoker    Types: Cigarettes    Last attempt to quit: 04/20/2013    Years since quitting: 5.1  . Smokeless tobacco: Never Used  Substance and Sexual Activity  . Alcohol use: Yes    Alcohol/week: 0.0 standard drinks    Comment: occas   . Drug use: No  . Sexual activity: Yes    Partners: Male    Birth control/protection: Surgical    Comment: hysterectomy   Lifestyle  . Physical activity:    Days per week: Not on file    Minutes per session: Not on file  . Stress: Not on file  Relationships  . Social connections:    Talks on phone: Not on file    Gets together: Not on file    Attends religious service: Not on file    Active member of club or organization: Not on file    Attends meetings of clubs or organizations: Not on file    Relationship status: Not on file  . Intimate partner violence:    Fear of current or ex partner: Not on file    Emotionally abused: Not on file    Physically abused: Not on file    Forced sexual activity: Not on file  Other Topics Concern  . Not on file  Social History Narrative  . Not on file    Review of Systems  Constitutional: Negative.   HENT:  Negative.   Eyes: Negative.   Respiratory: Negative.   Cardiovascular: Negative.   Gastrointestinal: Negative.   Genitourinary: Negative.   Musculoskeletal: Negative.   Skin: Negative.   Neurological: Negative.   Endo/Heme/Allergies: Negative.   Psychiatric/Behavioral: Negative.     PHYSICAL EXAMINATION:    BP 110/76 (BP Location: Right Arm, Patient Position: Sitting, Cuff  Size: Normal)   Pulse 84   Resp 14   Ht 5\' 8"  (1.727 m)   Wt 153 lb 1.6 oz (69.4 kg)   LMP 07/03/2013   BMI 23.28 kg/m     General appearance: alert, cooperative and appears stated age Breasts: stable area of increased nodularity in the upper outer quadrant of he left breast, the area of nodularity is 2-3 cm, mobile and tender. No skin changes. bilateral breast tenderness. No nipple inversion Abdomen: soft, non-tender; non distended, no masses,  no organomegaly No supraclavicular or axillary adenopathy.  ASSESSMENT Left breast nodularity, negative imaging of the left breast, stable exam. Are in right breast needs f/u imaging in May. Strong FH of breast cancer Headaches when patch is due for change (by day #4)    PLAN Continue with monthly breast self exams F/U right breast imaging in May Referral to Genetics for strong FH of breast cancer Will change her estrogen patch every 3 days.    An After Visit Summary was printed and given to the patient.

## 2018-05-26 ENCOUNTER — Ambulatory Visit: Payer: BLUE CROSS/BLUE SHIELD | Admitting: Obstetrics and Gynecology

## 2018-05-26 ENCOUNTER — Other Ambulatory Visit: Payer: Self-pay

## 2018-05-26 ENCOUNTER — Encounter: Payer: Self-pay | Admitting: Obstetrics and Gynecology

## 2018-05-26 VITALS — BP 110/76 | HR 84 | Resp 14 | Ht 68.0 in | Wt 153.1 lb

## 2018-05-26 DIAGNOSIS — Z79890 Hormone replacement therapy (postmenopausal): Secondary | ICD-10-CM

## 2018-05-26 DIAGNOSIS — Z803 Family history of malignant neoplasm of breast: Secondary | ICD-10-CM | POA: Diagnosis not present

## 2018-05-26 DIAGNOSIS — N63 Unspecified lump in unspecified breast: Secondary | ICD-10-CM | POA: Diagnosis not present

## 2018-05-31 ENCOUNTER — Encounter: Payer: Self-pay | Admitting: Genetic Counselor

## 2018-05-31 ENCOUNTER — Telehealth: Payer: Self-pay | Admitting: Genetic Counselor

## 2018-05-31 NOTE — Telephone Encounter (Signed)
A genetic counseling appt has been scheduled for the pt to see Elmer Picker for fhx of breast cancer on 3/26 at 845am. Letter mailed to the pt.

## 2018-06-29 ENCOUNTER — Other Ambulatory Visit: Payer: BLUE CROSS/BLUE SHIELD

## 2018-08-17 ENCOUNTER — Other Ambulatory Visit: Payer: Self-pay

## 2018-08-17 ENCOUNTER — Ambulatory Visit
Admission: RE | Admit: 2018-08-17 | Discharge: 2018-08-17 | Disposition: A | Discharge status: Home / Self Care | Payer: BLUE CROSS/BLUE SHIELD | Source: Ambulatory Visit | Attending: Obstetrics and Gynecology | Admitting: Obstetrics and Gynecology

## 2018-08-17 ENCOUNTER — Other Ambulatory Visit: Payer: Self-pay | Admitting: Obstetrics and Gynecology

## 2018-08-17 DIAGNOSIS — N6310 Unspecified lump in the right breast, unspecified quadrant: Secondary | ICD-10-CM

## 2018-08-25 ENCOUNTER — Telehealth: Payer: Self-pay | Admitting: Genetic Counselor

## 2018-08-25 NOTE — Telephone Encounter (Signed)
Called patient regarding upcoming Webex appointment, patient is notified and e-mail has been sent. Lab is cancelled.

## 2018-08-29 ENCOUNTER — Telehealth: Payer: Self-pay | Admitting: Genetic Counselor

## 2018-08-29 NOTE — Telephone Encounter (Signed)
Contacted patient to verify webex appt. For pre reg

## 2018-08-30 ENCOUNTER — Other Ambulatory Visit: Payer: BLUE CROSS/BLUE SHIELD

## 2018-08-30 ENCOUNTER — Encounter: Payer: Self-pay | Admitting: Genetic Counselor

## 2018-08-30 ENCOUNTER — Inpatient Hospital Stay: Payer: BLUE CROSS/BLUE SHIELD | Attending: Genetic Counselor | Admitting: Genetic Counselor

## 2018-08-30 DIAGNOSIS — Z7183 Encounter for nonprocreative genetic counseling: Secondary | ICD-10-CM

## 2018-08-30 DIAGNOSIS — Z803 Family history of malignant neoplasm of breast: Secondary | ICD-10-CM

## 2018-08-30 NOTE — Progress Notes (Signed)
REFERRING PROVIDER: Ardith Dark, PA-C [address] [place], [place] [address]  PRIMARY PROVIDER:  Ardith Dark, PA-C  PRIMARY REASON FOR VISIT:  1. Family history of breast cancer      HISTORY OF PRESENT ILLNESS:  I connected with Kirsten Fischer on 08/30/2018 at 9 AM EDT by Webex video conference and verified that I am speaking with the correct person using two identifiers.   Patient location: Home Provider location: Office  Kirsten Fischer, a 33 y.o. female, was seen for a [facility] [place] cancer genetics consultation at the request of Dr. Almedia Balls due to a personal and family history of cancer.  Kirsten Fischer presents to clinic today to discuss the possibility of a hereditary predisposition to cancer, genetic testing, and to further clarify her future cancer risks, as well as potential cancer risks for family members.   In 2018, at the age of 33, Kirsten Fischer was diagnosed with cervical cancer. The treatment plan included a complete hysterectomy, involving the removal of ovaries and fallopian tubes.  She is currently on hormone replacement therapy.      CANCER HISTORY:   No history exists.     RISK FACTORS:  Menarche was at age 73.  First live birth at age 1.  OCP use for approximately unknown years.  Ovaries intact: no.  Hysterectomy: yes.  Menopausal status: postmenopausal.  HRT use: 2 years. Colonoscopy: no; not examined. Mammogram within the last year: no. Number of breast biopsies: 0. Up to date with pelvic exams: yes. Any excessive radiation exposure in the past: no  Past Medical History:  Diagnosis Date  . Asthma   . Complication of anesthesia   . Endometriosis   . Family history of breast cancer   . Family history of prostate cancer   . GERD (gastroesophageal reflux disease)   . History of kidney infection during pregnancy   . PONV (postoperative nausea and vomiting)     Past Surgical History:  Procedure Laterality Date  . CYSTOSCOPY  N/A 10/04/2016   Procedure: CYSTOSCOPY;  Surgeon: Megan Salon, MD;  Location: [facility];  Service: Gynecology;  Laterality: N/A;  . ENDOMETRIAL ABLATION    . fatty tumor removed    . MANDIBLE FRACTURE SURGERY    . REPAIR VAGINAL CUFF N/A 10/16/2016   Procedure: EXAM UNDER ANESTHESIA, REPAIR OF VAGINAL CUFF DEHISCENCE, DRAINGAGE OF PELVIC ABCESS;  Surgeon: [name], MD;  Location: [facility];  Service: Gynecology;  Laterality: N/A;  . TOTAL LAPAROSCOPIC HYSTERECTOMY WITH SALPINGECTOMY N/A 10/04/2016   Procedure: HYSTERECTOMY TOTAL LAPAROSCOPIC WITH BILATERAL SALPINGO-OOPHORECTOMY AND CYSTOSCOPY PERITONEAL BIOPSY;  Surgeon: Megan Salon, MD;  Location: [facility];  Service: Gynecology;  Laterality: N/A;  . WISDOM TOOTH EXTRACTION  12 years ago    Social History   Socioeconomic History  . Marital status: Divorced    Spouse name: Not on file  . Number of children: Not on file  . Years of education: Not on file  . Highest education level: Not on file  Occupational History  . Not on file  Social Needs  . Financial resource strain: Not on file  . Food insecurity:    Worry: Not on file    Inability: Not on file  . Transportation needs:    Medical: Not on file    Non-medical: Not on file  Tobacco Use  . Smoking status: Former Smoker    Types: Cigarettes    Last attempt to quit: 04/20/2013  Years since quitting: 5.3  . Smokeless tobacco: Never Used  Substance and Sexual Activity  . Alcohol use: Yes    Alcohol/week: 0.0 standard drinks    Comment: occas   . Drug use: No  . Sexual activity: Yes    Partners: Male    Birth control/protection: Surgical    Comment: hysterectomy   Lifestyle  . Physical activity:    Days per week: Not on file    Minutes per session: Not on file  . Stress: Not on file  Relationships  . Social connections:    Talks on phone: Not on file    Gets together: Not on file    Attends religious service: Not on file     Active member of club or organization: Not on file    Attends meetings of clubs or organizations: Not on file    Relationship status: Not on file  Other Topics Concern  . Not on file  Social History Narrative  . Not on file     FAMILY HISTORY:  We obtained a detailed, 4-generation family history.  Significant diagnoses are listed below: Family History  Problem Relation Age of Onset  . Hypertension Father   . Asthma Sister   . Diabetes Paternal Grandfather   . Diabetes Maternal Grandmother   . Breast cancer Maternal Grandmother        60's  . Breast cancer Mother 16  . Lymphoma Paternal Grandmother        Non Hodgskins Lymphoma  . Breast cancer Other 61  . Lymphoma Other 25  . Breast cancer Other 48  . Anesthesia problems Neg Hx   . Hypotension Neg Hx   . Malignant hyperthermia Neg Hx   . Pseudochol deficiency Neg Hx     The patient has three sons who are cancer free.  She has two sisters who are also cancer free.  Both parents are living.  Her mother was diagnosed with breast cancer at 10.  She has three brothers who are all cancer free.  The maternal grandmother is living and had breast cancer at 63 and 18.  The grandfather is deceased.  The grandmother had a sister with lymphoma and breast cancer at 15 and her mother also had breast cancer at 52.  The patient's father is cancer free.  He has two sisters who do not have cancer, but one sister has a daughter with thyroid cancer.  The paternal grandmother had lymphoma.  Kirsten Fischer is aware of previous family history of genetic testing for hereditary cancer risks, specifically both her mother and grandmother reportedly had genetic testing that was negative. Patient's maternal ancestors are of Caucasian descent, and paternal ancestors are of Caucasian descent. There is no reported Ashkenazi Jewish ancestry. There is no known consanguinity.  GENETIC COUNSELING ASSESSMENT: Kirsten Fischer is a 33 y.o. female with a personal and  family history of cancer which is somewhat suggestive of a hereditary cancer syndrome and predisposition to cancer. We, therefore, discussed and recommended the following at today's visit.   DISCUSSION: We discussed that 5 - 10% of breast cancer is hereditary, with most cases associated with BRCA mutations.  There are other genes that can be associated with hereditary breast cancer syndromes.  These include ATM, CHEK2 and PALB2.  We reviewed that most cases of cervical cancer is associated with exposure to HPV, and not part of hereditary breast cancer syndromes.  The patient reports that her mother had genetic testing about 3-4 years ago, that  was negative.  She thinks that she had a panel test.  We discussed that depending on whether she had a panel test or just had testing on the BRCA genes would help influence whether we needed to pursue genetic testing.  If her mother's genetic testing was a panel test, and she was negative for a hereditary pathogenic mutation, then her mother cannot pass a mutation on to her and therefore we would not need to pursue genetic testing. The patient voiced her understanding, and plans to have her mother obtain a copy of the results and will have it faxed to our office.  We discussed that some people do not want to undergo genetic testing due to fear of genetic discrimination.  A federal law called the Genetic Information Non-Discrimination Act (GINA) of 2008 helps protect individuals against genetic discrimination based on their genetic test results.  It impacts both health insurance and employment.  With health insurance, it protects against increased premiums, being kicked off insurance or being forced to take a test in order to be insured.  For employment it protects against hiring, firing and promoting decisions based on genetic test results.  Health status due to a cancer diagnosis is not protected under GINA.   We reviewed the characteristics, features and inheritance  patterns of hereditary cancer syndromes. We also discussed genetic testing, including the appropriate family members to test, the process of testing, insurance coverage and turn-around-time for results. We discussed the implications of a negative, positive and/or variant of uncertain significant result.   Based on Kirsten Fischer's personal and family history of cancer, she meets medical criteria for genetic testing. Despite that she meets criteria, she may not need it if her mother tested negative.  If she pursues genetic testing,  she may still have an out of pocket cost. We discussed that if her out of pocket cost for testing is over $100, the laboratory will call and confirm whether she wants to proceed with testing.  If the out of pocket cost of testing is less than $100 she will be billed by the genetic testing laboratory.   PLAN: Despite our recommendation, Kirsten Fischer did not wish to pursue genetic testing at today's visit. She will try to obtain a copy of her mother's genetic testing and forward it on.  We will touch base at that time to determine if further testing is warranted. We understand this decision and remain available to coordinate genetic testing at any time in the future. We, therefore, recommend Kirsten Fischer continue to follow the cancer screening guidelines given by her primary healthcare provider.  Lastly, we encouraged Kirsten Fischer to remain in contact with cancer genetics annually so that we can continuously update the family history and inform her of any changes in cancer genetics and testing that may be of benefit for this family.   Ms. Trellis Moment questions were answered to her satisfaction today. Our contact information was provided should additional questions or concerns arise. Thank you for the referral and allowing [place] to share in the care of your patient.   Nana Hoselton P. Florene Glen, [place], [facility] Certified Genetic Counselor [email or web address] phone: [phone]  The  patient was seen for a total of 45 minutes in face-to-face genetic counseling.  This patient was discussed with Drs. Magrinat, Lindi Adie and/or Burr Medico who agrees with the above.    _______________________________________________________________________ For Office Staff:  Number of people involved in session: 1 Was an Intern/ student involved with case: no

## 2018-11-07 ENCOUNTER — Telehealth: Payer: Self-pay | Admitting: Obstetrics and Gynecology

## 2018-11-07 ENCOUNTER — Encounter: Payer: Self-pay | Admitting: Obstetrics and Gynecology

## 2018-11-07 NOTE — Telephone Encounter (Signed)
Please let the patient know that I don't think there is an exact correct answer about stopping the progesterone. The following are some factors to consider: -She still had residual endometriosis at the time of her hysterectomy. The progesterone was given to try and counter the effects of the estrogen.  -The estrogen she is getting in her patch is lower than what her body was making prior to having her ovaries removed which should decrease the risk.  -It is possible to develop symptoms of her endometriosis to come back even off of all HRT.  -There are several studies that don't report adverse effects of using estrogen replacement after oophorectomy for endometriosis.  Her options are: To come off of the progesterone, continue with the Prometrium, or change to provera or aygestin (provera will surely be cheaper). I hope this is helpful

## 2018-11-07 NOTE — Telephone Encounter (Signed)
Spoke with patient, advised as seen below per Dr. Bryon Lions. Patient request an OV to further discuss with Dr. Talbert Nan. OV scheduled for 8/5 at 8:30am. Covid19 precautions reviewed, prescreen negative.   Routing to provider for final review. Patient is agreeable to disposition. Will close encounter.

## 2018-11-07 NOTE — Telephone Encounter (Signed)
Patient sent the following correspondence through [place]. Routing to triage to assist patient with request.  Good morning,   I was wondering do I have to take the progesterone? I am on the estradiol path and was taking 100mg  progesterone too. My insurance company is now saying the progesterone is not covered and it's going to cost me $460. I think in the past it was mentioned that I don't have to take both. My hormones have been a roller coaster and before I stoped taking it I wanted to see if I could go without it?

## 2018-11-07 NOTE — Telephone Encounter (Signed)
Spoke with patient. Hx of hysterectomy. Reviewed option of Good RX coupon at [facility], out of pocket approximately $32. Patient request Dr. Talbert Nan advise if progesterone still needed? Could she stop the progesterone?   Dr. Talbert Nan -please review and advise.

## 2018-11-08 ENCOUNTER — Encounter: Payer: Self-pay | Admitting: Obstetrics and Gynecology

## 2018-11-08 ENCOUNTER — Other Ambulatory Visit: Payer: Self-pay

## 2018-11-08 ENCOUNTER — Ambulatory Visit (INDEPENDENT_AMBULATORY_CARE_PROVIDER_SITE_OTHER): Payer: BLUE CROSS/BLUE SHIELD | Admitting: Obstetrics and Gynecology

## 2018-11-08 VITALS — BP 110/80 | HR 80 | Temp 97.2°F | Wt 168.6 lb

## 2018-11-08 DIAGNOSIS — N6321 Unspecified lump in the left breast, upper outer quadrant: Secondary | ICD-10-CM | POA: Diagnosis not present

## 2018-11-08 DIAGNOSIS — Z90722 Acquired absence of ovaries, bilateral: Secondary | ICD-10-CM

## 2018-11-08 DIAGNOSIS — Z79890 Hormone replacement therapy (postmenopausal): Secondary | ICD-10-CM | POA: Diagnosis not present

## 2018-11-08 DIAGNOSIS — Z90710 Acquired absence of both cervix and uterus: Secondary | ICD-10-CM | POA: Diagnosis not present

## 2018-11-08 DIAGNOSIS — Z8742 Personal history of other diseases of the female genital tract: Secondary | ICD-10-CM

## 2018-11-08 DIAGNOSIS — Z9079 Acquired absence of other genital organ(s): Secondary | ICD-10-CM

## 2018-11-08 DIAGNOSIS — Z803 Family history of malignant neoplasm of breast: Secondary | ICD-10-CM | POA: Diagnosis not present

## 2018-11-08 NOTE — Progress Notes (Signed)
GYNECOLOGY  VISIT   HPI: 33 y.o.   Divorced White or Caucasian Not Hispanic or Latino  female   [phone] with Patient's last menstrual period was 07/03/2013.   here to discuss HRT.   She had a hysterectomy and BSO in 2018 for stage 4 endometriosis.  She is on the estrogen patch, her patch was changed to a different generic in 3/20, she went from a clothing size 8 to a 14. She was bloated and swollen. She was changed back to the prior generic patch and is less swollen and has lost weight, down to a size 10.  She has been off of the progesterone for the last week, she ran out. She feels better off of the progesterone.  She is very emotional if she doesn't have her estrogen. She understands the reason she should be on estrogen. The progesterone was to try and prevent worsening of the remaining endometriosis.  She was in chronic pain prior to hysterectomy, BSO, severe pain in her back. Since surgery she has pain in her right groin. The pain comes and goes, but present most days, it can be sharp or dull. Hurts more to more, helps to sleep with a pillow between her leg and elevates her leg some. No bulge in her groin.   Life has been good, she got engaged, getting married in October (in her back yard).  No dyspareunia.   FH of breast cancer, she saw Roma Kayser (Genetisist) in 5/20, has obtained her mothers genetic screening which was negative. Need to send it to Ms Florene Glen.   She was noted to have increased nodularity in her left breast in 11/19, imaging on that side was normal. She notices a lump in that breast now that is tender  GYNECOLOGIC HISTORY: Patient's last menstrual period was 07/03/2013. Contraception: Hysterectomy Menopausal hormone therapy: Estradiol patch, progesterone        OB History    Gravida  4   Para  3   Term  3   Preterm  0   AB  1   Living  3     SAB  1   TAB  0   Ectopic  0   Multiple  0   Live Births  3              Patient Active Problem  List   Diagnosis Date Noted  . Family history of breast cancer   . Elevated white blood cell count 10/15/2016  . Endometriosis 10/04/2016  . Dysplasia of cervix, low grade (CIN 1) 09/26/2016  . Mild intermittent asthma 10/13/2015  . ADHD (attention deficit hyperactivity disorder), combined type 06/18/2015  . Frequent UTI 06/11/2015  . S/P endometrial ablation 07/10/2013  . History of biliary T-tube placement 07/10/2013    Past Medical History:  Diagnosis Date  . Asthma   . Complication of anesthesia   . Endometriosis   . Family history of breast cancer   . Family history of prostate cancer   . GERD (gastroesophageal reflux disease)   . History of kidney infection during pregnancy   . PONV (postoperative nausea and vomiting)     Past Surgical History:  Procedure Laterality Date  . CYSTOSCOPY N/A 10/04/2016   Procedure: CYSTOSCOPY;  Surgeon: Megan Salon, MD;  Location: [facility];  Service: Gynecology;  Laterality: N/A;  . ENDOMETRIAL ABLATION    . fatty tumor removed    . MANDIBLE FRACTURE SURGERY    . REPAIR VAGINAL CUFF N/A  10/16/2016   Procedure: EXAM UNDER ANESTHESIA, REPAIR OF VAGINAL CUFF DEHISCENCE, DRAINGAGE OF PELVIC ABCESS;  Surgeon: [name], MD;  Location: [facility];  Service: Gynecology;  Laterality: N/A;  . TOTAL LAPAROSCOPIC HYSTERECTOMY WITH SALPINGECTOMY N/A 10/04/2016   Procedure: HYSTERECTOMY TOTAL LAPAROSCOPIC WITH BILATERAL SALPINGO-OOPHORECTOMY AND CYSTOSCOPY PERITONEAL BIOPSY;  Surgeon: Megan Salon, MD;  Location: [facility];  Service: Gynecology;  Laterality: N/A;  . WISDOM TOOTH EXTRACTION  12 years ago    Current Outpatient Medications  Medication Sig Dispense Refill  . albuterol (PROVENTIL HFA;VENTOLIN HFA) 108 (90 Base) MCG/ACT inhaler Inhale 1-2 puffs into the lungs every 6 (six) hours as needed for wheezing or shortness of breath.    . estradiol (VIVELLE-DOT) 0.075 MG/24HR Place 1 patch onto the skin 2  (two) times a week.    . montelukast (SINGULAIR) 10 MG tablet Take 1 tablet (10 mg total) by mouth at bedtime. 90 tablet 4  . progesterone (PROMETRIUM) 100 MG capsule Take 100 mg by mouth daily.    Marland Kitchen VYVANSE 30 MG capsule TAKE 1 CAPSULE (30 MG TOTAL) BY MOUTH EVERY MORNING FOR 30 DAYS.    Marland Kitchen albuterol (PROVENTIL) (2.5 MG/3ML) 0.083% nebulizer solution Take 2.5 mg by nebulization every 4 (four) hours as needed.      No current facility-administered medications for this visit.      ALLERGIES: Nitrofurantoin and Atomoxetine  Family History  Problem Relation Age of Onset  . Hypertension Father   . Asthma Sister   . Diabetes Paternal Grandfather   . Diabetes Maternal Grandmother   . Breast cancer Maternal Grandmother        60's  . Breast cancer Mother 65  . Lymphoma Paternal Grandmother        Non Hodgskins Lymphoma  . Breast cancer Other 63  . Lymphoma Other 25  . Breast cancer Other 48  . Anesthesia problems Neg Hx   . Hypotension Neg Hx   . Malignant hyperthermia Neg Hx   . Pseudochol deficiency Neg Hx   Mom, MGM, MGM all with breast cancer. Mom was in her 71's, Mom just had negative genetic testing.   Social History   Socioeconomic History  . Marital status: Divorced    Spouse name: Not on file  . Number of children: Not on file  . Years of education: Not on file  . Highest education level: Not on file  Occupational History  . Not on file  Social Needs  . Financial resource strain: Not on file  . Food insecurity    Worry: Not on file    Inability: Not on file  . Transportation needs    Medical: Not on file    Non-medical: Not on file  Tobacco Use  . Smoking status: Former Smoker    Types: Cigarettes    Quit date: 04/20/2013    Years since quitting: 5.5  . Smokeless tobacco: Never Used  Substance and Sexual Activity  . Alcohol use: Yes    Alcohol/week: 0.0 standard drinks    Comment: occas   . Drug use: No  . Sexual activity: Yes    Partners: Male    Birth  control/protection: Surgical    Comment: hysterectomy   Lifestyle  . Physical activity    Days per week: Not on file    Minutes per session: Not on file  . Stress: Not on file  Relationships  . Social connections    Talks on phone: Not on file  Gets together: Not on file    Attends religious service: Not on file    Active member of club or organization: Not on file    Attends meetings of clubs or organizations: Not on file    Relationship status: Not on file  . Intimate partner violence    Fear of current or ex partner: Not on file    Emotionally abused: Not on file    Physically abused: Not on file    Forced sexual activity: Not on file  Other Topics Concern  . Not on file  Social History Narrative  . Not on file    Review of Systems  Constitutional:       Weight gain Hot flashes Left breast mass  HENT: Negative.   Eyes: Negative.   Respiratory: Negative.   Cardiovascular: Negative.   Gastrointestinal: Negative.   Genitourinary: Negative.   Musculoskeletal: Negative.   Skin: Negative.   Neurological: Negative.   Endo/Heme/Allergies: Negative.   Psychiatric/Behavioral: Negative.     PHYSICAL EXAMINATION:    BP 110/80 (BP Location: Right Arm, Patient Position: Sitting, Cuff Size: Normal)   Pulse 80   Temp (!) 97.2 F (36.2 C) (Skin)   Wt 168 lb 9.6 oz (76.5 kg)   LMP 07/03/2013   BMI 25.64 kg/m     General appearance: alert, cooperative and appears stated age Breasts: in the left breast at 2 o'clock is a 3 x 2 cm mobile, tender lump. No other lumps, no skin changes.  No supraclavicular or axillary adenopathy.   ASSESSMENT 1) HRT, she is s/p hysterectomy and BSO for stage 4 endometriosis. She has been on estrogen and progesterone since her surgery 2 years ago. The progesterone has gotten expensive, she ran out a week ago and feels better off of it. We discussed the thought process for using the progesterone and that there are other options for progesterone.  Also reviewed that there are studies (gave her information from the [facility]) that don't show adverse effects from using ERT after BSO for hysterectomy.   2)Right groin pain, chronic. Sounds MS  3)Breast lump, left side 2 o'clock, larger than in 11/19  4)FH of breast cancer, Mom had negative genetic testing. The patient met with the genetic counselor, awaiting the patient's mothers genetic testing results prior to making recommendations    PLAN 1)She will continue ERT and stop the Prometrium. She does understand that there is a risk of endometriosis symptoms  2)Will refer to her primary to evaluate her groin pain (question need to see Orthopedics or Sports Medicine)  3)Will set up left breast imaging. She just had f/u right breast imaging in 5/20  4) will fax Mom's genetic testing to Roma Kayser, will await her recommendations for breast cancer screening.    An After Visit Summary was printed and given to the patient.  Over 25 minutes face to face time of which over 50% was spent in counseling.

## 2018-11-22 ENCOUNTER — Telehealth: Payer: Self-pay | Admitting: Genetic Counselor

## 2018-11-22 ENCOUNTER — Encounter: Payer: Self-pay | Admitting: Genetic Counselor

## 2018-11-22 NOTE — Progress Notes (Signed)
I spoke with the patient about her mother's genetic testing.  Her mother underwent genetic testing at [facility] in 2015.  At that time she was tested with the Breast/Ovarian Cancer Syndrome panel through [facility].  The Breast/Ovarian gene panel offered by [facility] includes sequencing and rearrangement analysis for the following 20 genes:  ATM, BARD1, BRCA1, BRCA2, BRIP1, CDH1, CHEK2, EPCAM, FANCC, MLH1, MSH2, MSH6, NBN, PALB2, PMS2, PTEN, RAD51C, RAD51D, TP53, and XRCC2.   Since this testing is negative, it does not appear that her mother's breast cancer is due to a hereditary breast cancer gene mutation.  This negative test indicates that we do not need to test the patient.  Based on Kirsten Fischer's family of cancer, as well as her genetic test results, statistical models (Tyrer Cusik)  and literature data were used to estimate her risk of developing breast cancer. These estimate her lifetime risk of developing breast cancer to be approximately 19.8%.  The patient's lifetime breast cancer risk is a preliminary estimate based on available information using one of several models endorsed by the [place] (ACS). The ACS recommends consideration of breast MRI screening as an adjunct to mammography for patients at high risk (defined as 20% or greater lifetime risk). A more detailed breast cancer risk assessment can be considered, if clinically indicated.   Kirsten Fischer has been determined to be just under the cut-off of being called at high risk for breast cancer.  We, therefore, discussed that it is reasonable for Kirsten Fischer to be followed by a high-risk breast cancer clinic; in addition to a yearly mammogram and physical exam by a healthcare provider, she should discuss the usefulness of an annual breast MRI with the high-risk clinic providers.

## 2018-11-22 NOTE — Telephone Encounter (Signed)
LM on VM in response to receiving her mother's test results, so that we can discuss.  Asked that she CB and left CB number.

## 2018-11-22 NOTE — Telephone Encounter (Signed)
Patient called back.  Informed her that with her mother's negative testing, we do not need to test the patient.  However, her lifetime risk for breast cancer is 19.8%.  We could offer her a referral to the high risk breast clinic.  She wanted to talk with her GYN provider before pursuing this.

## 2019-02-12 NOTE — Progress Notes (Signed)
33 y.o. [id number] Divorced White or Caucasian Not Hispanic or Latino female here for annual exam.  H/O TLH/BSO for severe endo, vaginal cuff dehiscence.  FH of breast cancer, her TC risk just under 20%. She has an appointment at the high risk breast clinic next week.   She is on HRT, no vasomotor symptoms. She is getting headaches daily, they have come and gone since her surgery. Recently more common.  She has had issues with which brand of estrogen. She is on prometrium. She feels like her mood has been bad since her surgery. More good days than bad.  Just got married in 10/20. Sexually active, no pain.     Patient's last menstrual period was 07/03/2013.          Sexually active: Yes.    The current method of family planning is status post hysterectomy.    Exercising: No.  The patient does not participate in regular exercise at present. 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 HR HPV: +Detected. 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 BIRADS2:Benign. f/u age 89. TDaP:03/2011 Gardasil: No   reports that she quit smoking about 5 years ago. Her smoking use included cigarettes. She has never used smokeless tobacco. She reports previous alcohol use. She reports that she does not use drugs. 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 Kids 13, 10 and 7 (all boys). Oldest sons father is in prison, she shares custody of the other 2 boys with their Dad.   Past Medical History:  Diagnosis Date  . Asthma   . Complication of anesthesia   . Endometriosis   . Family history of breast cancer   . Family history of prostate cancer   . GERD (gastroesophageal reflux disease)   . History of kidney infection during pregnancy   . PONV (postoperative nausea and vomiting)     Past Surgical History:  Procedure Laterality Date  .  CYSTOSCOPY N/A 10/04/2016   Procedure: CYSTOSCOPY;  Surgeon: Megan Salon, MD;  Location: [facility];  Service: Gynecology;  Laterality: N/A;  . ENDOMETRIAL ABLATION    . fatty tumor removed    . MANDIBLE FRACTURE SURGERY    . REPAIR VAGINAL CUFF N/A 10/16/2016   Procedure: EXAM UNDER ANESTHESIA, REPAIR OF VAGINAL CUFF DEHISCENCE, DRAINGAGE OF PELVIC ABCESS;  Surgeon: [name], MD;  Location: [facility];  Service: Gynecology;  Laterality: N/A;  . TOTAL LAPAROSCOPIC HYSTERECTOMY WITH SALPINGECTOMY N/A 10/04/2016   Procedure: HYSTERECTOMY TOTAL LAPAROSCOPIC WITH BILATERAL SALPINGO-OOPHORECTOMY AND CYSTOSCOPY PERITONEAL BIOPSY;  Surgeon: Megan Salon, MD;  Location: [facility];  Service: Gynecology;  Laterality: N/A;  . WISDOM TOOTH EXTRACTION  12 years ago    Current Outpatient Medications  Medication Sig Dispense Refill  . albuterol (PROVENTIL HFA;VENTOLIN HFA) 108 (90 Base) MCG/ACT inhaler Inhale 1-2 puffs into the lungs every 6 (six) hours as needed for wheezing or shortness of breath.    . amphetamine-dextroamphetamine (ADDERALL XR) 20 MG 24 hr capsule Take 1 capsule by mouth daily.    Marland Kitchen estradiol (VIVELLE-DOT) 0.05 MG/24HR patch Place 1 patch onto the skin 2 (two) times a week.    . montelukast (SINGULAIR) 10 MG tablet Take 1 tablet (10 mg total) by mouth at bedtime. 90 tablet 4  . progesterone (PROMETRIUM) 100 MG capsule Take 100 mg by mouth daily.    Marland Kitchen  albuterol (PROVENTIL) (2.5 MG/3ML) 0.083% nebulizer solution Take 2.5 mg by nebulization every 4 (four) hours as needed.      No current facility-administered medications for this visit.     Family History  Problem Relation Age of Onset  . Hypertension Father   . Asthma Sister   . Diabetes Paternal Grandfather   . Diabetes Maternal Grandmother   . Breast cancer Maternal Grandmother        60's  . Breast cancer Mother 73  . Lymphoma Paternal Grandmother        Non Hodgskins Lymphoma  .  Breast cancer Other 62  . Lymphoma Other 25  . Breast cancer Other 48  . Anesthesia problems Neg Hx   . Hypotension Neg Hx   . Malignant hyperthermia Neg Hx   . Pseudochol deficiency Neg Hx     Review of Systems  Constitutional: Negative.   HENT: Negative.   Eyes: Negative.   Respiratory: Negative.   Cardiovascular: Negative.   Gastrointestinal: Negative.   Endocrine: Negative.   Genitourinary:       Darker urination  Musculoskeletal: Negative.   Skin: Negative.   Allergic/Immunologic: Negative.   Neurological: Negative.   Hematological: Negative.   Psychiatric/Behavioral: Negative.     Exam:   BP 120/72 (BP Location: Right Arm, Patient Position: Sitting, Cuff Size: Normal)   Pulse 80   Temp (!) 97.3 F (36.3 C) (Skin)   Ht 5\' 8"  (1.727 m)   Wt 173 lb 6.4 oz (78.7 kg)   LMP 07/03/2013   BMI 26.37 kg/m   Weight change: @WEIGHTCHANGE @ Height:   Height: 5\' 8"  (172.7 cm)  Ht Readings from Last 3 Encounters:  02/15/19 5\' 8"  (1.727 m)  05/26/18 5\' 8"  (1.727 m)  02/13/18 5\' 8"  (1.727 m)    General appearance: alert, cooperative and appears stated age Head: Normocephalic, without obvious abnormality, atraumatic Neck: no adenopathy, supple, symmetrical, trachea midline and thyroid normal to inspection and palpation Lungs: clear to auscultation bilaterally Cardiovascular: regular rate and rhythm Breasts: persistent nodularity in the left breast at 2 o'clock near the periphery, ~2 cm Abdomen: soft, non-tender; non distended,  no masses,  no organomegaly Extremities: extremities normal, atraumatic, no cyanosis or edema Skin: Skin color, texture, turgor normal. No rashes or lesions Lymph nodes: Cervical, supraclavicular, and axillary nodes normal. No abnormal inguinal nodes palpated Neurologic: Grossly normal   Pelvic: External genitalia:  no lesions              Urethra:  normal appearing urethra with no masses, tenderness or lesions              Bartholins and Skenes:  normal                 Vagina: normal appearing vagina with normal color and discharge, no lesions              Cervix: absent               Bimanual Exam:  Uterus:  uterus absent              Adnexa: no mass, fullness, tenderness               Rectovaginal: Confirms               Anus:  normal sphincter tone, no lesions  Chaperone was present for exam.  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 Discussed a trial off. She is on prometrium secondary to her severe endometriosis.   Discussed breast self exam  Discussed calcium and vit D intake  Breast imaging is scheduled and she has an appointment at the high risk breast clinic next week.

## 2019-02-14 ENCOUNTER — Other Ambulatory Visit: Payer: Self-pay

## 2019-02-15 ENCOUNTER — Encounter: Payer: Self-pay | Admitting: Obstetrics and Gynecology

## 2019-02-15 ENCOUNTER — Other Ambulatory Visit: Payer: BLUE CROSS/BLUE SHIELD

## 2019-02-15 ENCOUNTER — Ambulatory Visit (INDEPENDENT_AMBULATORY_CARE_PROVIDER_SITE_OTHER): Payer: BC Managed Care – PPO | Admitting: Obstetrics and Gynecology

## 2019-02-15 VITALS — BP 120/72 | HR 80 | Temp 97.3°F | Ht 68.0 in | Wt 173.4 lb

## 2019-02-15 DIAGNOSIS — Z803 Family history of malignant neoplasm of breast: Secondary | ICD-10-CM

## 2019-02-15 DIAGNOSIS — Z0000 Encounter for general adult medical examination without abnormal findings: Secondary | ICD-10-CM

## 2019-02-15 DIAGNOSIS — Z01419 Encounter for gynecological examination (general) (routine) without abnormal findings: Secondary | ICD-10-CM | POA: Diagnosis not present

## 2019-02-15 DIAGNOSIS — Z79890 Hormone replacement therapy (postmenopausal): Secondary | ICD-10-CM

## 2019-02-15 DIAGNOSIS — N6321 Unspecified lump in the left breast, upper outer quadrant: Secondary | ICD-10-CM | POA: Diagnosis not present

## 2019-02-15 DIAGNOSIS — Z9079 Acquired absence of other genital organ(s): Secondary | ICD-10-CM

## 2019-02-15 DIAGNOSIS — F39 Unspecified mood [affective] disorder: Secondary | ICD-10-CM

## 2019-02-15 DIAGNOSIS — Z90722 Acquired absence of ovaries, bilateral: Secondary | ICD-10-CM

## 2019-02-15 DIAGNOSIS — E349 Endocrine disorder, unspecified: Secondary | ICD-10-CM

## 2019-02-15 DIAGNOSIS — Z8742 Personal history of other diseases of the female genital tract: Secondary | ICD-10-CM

## 2019-02-15 DIAGNOSIS — Z90710 Acquired absence of both cervix and uterus: Secondary | ICD-10-CM

## 2019-02-15 NOTE — Patient Instructions (Signed)
EXERCISE AND DIET:  We recommended that you start or continue a regular exercise program for good health. Regular exercise means any activity that makes your heart beat faster and makes you sweat.  We recommend exercising at least 30 minutes per day at least 3 days a week, preferably 4 or 5.  We also recommend a diet low in fat and sugar.  Inactivity, poor dietary choices and obesity can cause diabetes, heart attack, stroke, and kidney damage, among others.    ALCOHOL AND SMOKING:  Women should limit their alcohol intake to no more than 7 drinks/beers/glasses of wine (combined, not each!) per week. Moderation of alcohol intake to this level decreases your risk of breast cancer and liver damage. And of course, no recreational drugs are part of a healthy lifestyle.  And absolutely no smoking or even second hand smoke. Most people know smoking can cause heart and lung diseases, but did you know it also contributes to weakening of your bones? Aging of your skin?  Yellowing of your teeth and nails?  CALCIUM AND VITAMIN D:  Adequate intake of calcium and Vitamin D are recommended.  The recommendations for exact amounts of these supplements seem to change often, but generally speaking 1,000 mg of calcium (between diet and supplement) and 800 units of Vitamin D per day seems prudent. Certain women may benefit from higher intake of Vitamin D.  If you are among these women, your doctor will have told you during your visit.    PAP SMEARS:  Pap smears, to check for cervical cancer or precancers,  have traditionally been done yearly, although recent scientific advances have shown that most women can have pap smears less often.  However, every woman still should have a physical exam from her gynecologist every year. It will include a breast check, inspection of the vulva and vagina to check for abnormal growths or skin changes, a visual exam of the cervix, and then an exam to evaluate the size and shape of the uterus and  ovaries.  And after 35 years of age, a rectal exam is indicated to check for rectal cancers. We will also provide age appropriate advice regarding health maintenance, like when you should have certain vaccines, screening for sexually transmitted diseases, bone density testing, colonoscopy, mammograms, etc.   MAMMOGRAMS:  All women over 40 years old should have a yearly mammogram. Many facilities now offer a "3D" mammogram, which may cost around $50 extra out of pocket. If possible,  we recommend you accept the option to have the 3D mammogram performed.  It both reduces the number of women who will be called back for extra views which then turn out to be normal, and it is better than the routine mammogram at detecting truly abnormal areas.    COLON CANCER SCREENING: Now recommend starting at age 45. At this time colonoscopy is not covered for routine screening until 50. There are take home tests that can be done between 45-49.   COLONOSCOPY:  Colonoscopy to screen for colon cancer is recommended for all women at age 50.  We know, you hate the idea of the prep.  We agree, BUT, having colon cancer and not knowing it is worse!!  Colon cancer so often starts as a polyp that can be seen and removed at colonscopy, which can quite literally save your life!  And if your first colonoscopy is normal and you have no family history of colon cancer, most women don't have to have it again for   10 years.  Once every ten years, you can do something that may end up saving your life, right?  We will be happy to help you get it scheduled when you are ready.  Be sure to check your insurance coverage so you understand how much it will cost.  It may be covered as a preventative service at no cost, but you should check your particular policy.      Breast Self-Awareness Breast self-awareness means being familiar with how your breasts look and feel. It involves checking your breasts regularly and reporting any changes to your  health care provider. Practicing breast self-awareness is important. A change in your breasts can be a sign of a serious medical problem. Being familiar with how your breasts look and feel allows you to find any problems early, when treatment is more likely to be successful. All women should practice breast self-awareness, including women who have had breast implants. How to do a breast self-exam One way to learn what is normal for your breasts and whether your breasts are changing is to do a breast self-exam. To do a breast self-exam: Look for Changes  1. Remove all the clothing above your waist. 2. Stand in front of a mirror in a room with good lighting. 3. Put your hands on your hips. 4. Push your hands firmly downward. 5. Compare your breasts in the mirror. Look for differences between them (asymmetry), such as: ? Differences in shape. ? Differences in size. ? Puckers, dips, and bumps in one breast and not the other. 6. Look at each breast for changes in your skin, such as: ? Redness. ? Scaly areas. 7. Look for changes in your nipples, such as: ? Discharge. ? Bleeding. ? Dimpling. ? Redness. ? A change in position. Feel for Changes Carefully feel your breasts for lumps and changes. It is best to do this while lying on your back on the floor and again while sitting or standing in the shower or tub with soapy water on your skin. Feel each breast in the following way:  Place the arm on the side of the breast you are examining above your head.  Feel your breast with the other hand.  Start in the nipple area and make  inch (2 cm) overlapping circles to feel your breast. Use the pads of your three middle fingers to do this. Apply light pressure, then medium pressure, then firm pressure. The light pressure will allow you to feel the tissue closest to the skin. The medium pressure will allow you to feel the tissue that is a little deeper. The firm pressure will allow you to feel the tissue  close to the ribs.  Continue the overlapping circles, moving downward over the breast until you feel your ribs below your breast.  Move one finger-width toward the center of the body. Continue to use the  inch (2 cm) overlapping circles to feel your breast as you move slowly up toward your collarbone.  Continue the up and down exam using all three pressures until you reach your armpit.  Write Down What You Find  Write down what is normal for each breast and any changes that you find. Keep a written record with breast changes or normal findings for each breast. By writing this information down, you do not need to depend only on memory for size, tenderness, or location. Write down where you are in your menstrual cycle, if you are still menstruating. If you are having trouble noticing differences   in your breasts, do not get discouraged. With time you will become more familiar with the variations in your breasts and more comfortable with the exam. How often should I examine my breasts? Examine your breasts every month. If you are breastfeeding, the best time to examine your breasts is after a feeding or after using a breast pump. If you menstruate, the best time to examine your breasts is 5-7 days after your period is over. During your period, your breasts are lumpier, and it may be more difficult to notice changes. When should I see my health care provider? See your health care provider if you notice:  A change in shape or size of your breasts or nipples.  A change in the skin of your breast or nipples, such as a reddened or scaly area.  Unusual discharge from your nipples.  A lump or thick area that was not there before.  Pain in your breasts.  Anything that concerns you.  

## 2019-02-19 ENCOUNTER — Encounter: Payer: Self-pay | Admitting: Obstetrics and Gynecology

## 2019-02-19 ENCOUNTER — Other Ambulatory Visit: Payer: Self-pay | Admitting: Obstetrics and Gynecology

## 2019-02-19 ENCOUNTER — Other Ambulatory Visit: Payer: BLUE CROSS/BLUE SHIELD

## 2019-02-19 ENCOUNTER — Ambulatory Visit
Admission: RE | Admit: 2019-02-19 | Discharge: 2019-02-19 | Disposition: A | Discharge status: Home / Self Care | Payer: BC Managed Care – PPO | Source: Ambulatory Visit | Attending: Obstetrics and Gynecology | Admitting: Obstetrics and Gynecology

## 2019-02-19 ENCOUNTER — Ambulatory Visit
Admission: RE | Admit: 2019-02-19 | Discharge: 2019-02-19 | Disposition: A | Discharge status: Home / Self Care | Payer: BLUE CROSS/BLUE SHIELD | Source: Ambulatory Visit | Attending: Obstetrics and Gynecology | Admitting: Obstetrics and Gynecology

## 2019-02-19 ENCOUNTER — Telehealth: Payer: Self-pay | Admitting: Obstetrics and Gynecology

## 2019-02-19 ENCOUNTER — Other Ambulatory Visit: Payer: Self-pay

## 2019-02-19 DIAGNOSIS — N6320 Unspecified lump in the left breast, unspecified quadrant: Secondary | ICD-10-CM

## 2019-02-19 DIAGNOSIS — N6310 Unspecified lump in the right breast, unspecified quadrant: Secondary | ICD-10-CM

## 2019-02-19 DIAGNOSIS — Z803 Family history of malignant neoplasm of breast: Secondary | ICD-10-CM

## 2019-02-19 DIAGNOSIS — N6321 Unspecified lump in the left breast, upper outer quadrant: Secondary | ICD-10-CM

## 2019-02-19 DIAGNOSIS — N63 Unspecified lump in unspecified breast: Secondary | ICD-10-CM

## 2019-02-19 NOTE — Telephone Encounter (Signed)
Patient sent the following message through [place]. Routing to triage to assist patient with request.  [facility]  Phone Number: [phone]        Dr Talbert Nan,  I was seen this morning by the Breast clinic. The dr i saw this morning told me the same thing as the result at the bottom of your message. Did you send my information to a oncologist?

## 2019-02-19 NOTE — Telephone Encounter (Signed)
Reviewed 02/19/19 breast imaging results and recommendations.   Strong family Hx of breast cancer, negative genetic testing. TC risk just under 20%.    Dr. Talbert Nan -please review MyChart message and advise if referral is needed? No active referral.

## 2019-02-20 NOTE — Telephone Encounter (Signed)
I can't see any notes in Epic, I thought the high risk breast clinic was with the Oncologists (please let the patient know that is what I meant). I don't understand her message, did they recommend referral to a surgeon? Do you know where she was seen? Can you please get a copy of the note.

## 2019-02-20 NOTE — Telephone Encounter (Signed)
Call reviewed with Dr. Talbert Nan. Call returned to patient.  Referral recommended to high risk breast clinic for further evaluation of persistent breast nodularity in left breast, negative imaging. Patient agreeable. Referral placed. Patient aware she will be notified of appt details once scheduled.   Routing to provider for final review. Patient is agreeable to disposition. Will close encounter.  Cc: Kirsten Fischer

## 2019-02-20 NOTE — Telephone Encounter (Signed)
Spoke with patient.   Patient has not been seen or referred to the high risk breast clinic or to a surgeon.   Patient states she discussed results with radiologist and a bilateral Dx MMG and right breast US was recommended in 1 yr.   Patient was replying to MyChart results from Dr. Talbert Nan and her recommendations for oncologist. Patient states there was no discussion of referral to oncology or surgeon.   Advised patient I will update Dr. Talbert Nan and return call.

## 2019-02-21 ENCOUNTER — Telehealth: Payer: Self-pay | Admitting: Oncology

## 2019-02-21 NOTE — Telephone Encounter (Signed)
Mrs. Kirsten Fischer returned my call and has been scheduled to see Dr. Jana Hakim on 12/16 at 4pm. Pt aware to arrive 15 minutes early.

## 2019-03-20 NOTE — Progress Notes (Signed)
[name]  Telephone:([phone] Fax:(336) [phone]     ID: Kirsten Fischer DOB: 04/17/1985  MR#: [id number]  HYW#:[id number]  Patient Care Team: Ardith Dark, [name] as PCP - General (Physician Assistant) [name], MD as Consulting Physician (Obstetrics and Gynecology) [name], [name], MD as Consulting Physician (Oncology) Chauncey Cruel, MD OTHER MD:  CHIEF COMPLAINT: high risk for breast cancer  CURRENT TREATMENT: Considering intensified screening   HISTORY OF CURRENT ILLNESS: Kirsten Fischer has a family history of breast cancer in her mother at age 35 and her maternal grandmother at ages 48 and 49 (2 different cancers). Per note by our geneticist, her mother underwent genetic testing in 2015 at [facility]. At that time she was tested with the Breast/Ovarian Cancer Syndrome panel through [facility].  The Breast/Ovarian gene panel offered by [facility] includes sequencing and rearrangement analysis for the following 20 genes:  ATM, BARD1, BRCA1, BRCA2, BRIP1, CDH1, CHEK2, EPCAM, FANCC, MLH1, MSH2, MSH6, NBN, PALB2, PMS2, PTEN, RAD51C, RAD51D, TP53, and XRCC2.   Since this testing is negative, it does not appear that her mother's breast cancer is due to a hereditary breast cancer gene mutation. In light of this, the patient did not undergo genetic testing.  Lelaina herself has a history of breast nodularity. 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 This was again seen in 07/2009.   She presented on 02/15/2018 with palpable left breast abnormality and underwent bilateral diagnostic mammogram and bilateral breast ultrasonography at [facility]. This showed: breast density category C; no left breast abnormalities, only extremely dense fibroglandular tissue as well as a tiny cyst; probably benign 0.7 cm cluster of microcysts in the right breast at 12 o'clock.  She underwent follow up right diagnostic mammogram  and right breast ultrasound on 08/17/2018 at [facility] for evaluation of the previously seen right breast mass. This showed: breast density category C; stable 7 mm likely clustered microcysts over the right breast at 12 o'clock; 6 month follow up recommended.  She presented for follow up bilateral mammography and right breast ultrasonography with palpable thickening in the upper-outer left breast discovered on self-examination. This showed: breast density category D; stable right breast mass at 12 o'clock; no mammographic or sonographic abnormality within the upper-outer left breast in area of patient concern; no evidence of breast malignancy.  The patient's subsequent history is as detailed below.   INTERVAL HISTORY: [name] was evaluated in the high risk breast cancer clinic on 03/21/2019 accompanied by her husband Kirsten Fischer  She has a history of severe endometriosis.  On 10/04/2016 she underwent bilateral salpingooophorectomy and hysterectomy together with peritoneal biopsies.  There was no evidence of malignancy.  (SZB [id number]).  She was then started on hormone replacement (estrogen and progesterone).  She feels she never was quite "back to her normal" but she was functional.  When her mother's cancer was diagnosed because of the increased cancer risk with combined hormone replacement the progesterone was discontinued.  She has felt much worse since that time, with nearly daily headaches, bloating and other abdominal symptoms and possibly some depression.  She is here to discuss alternatives.  REVIEW OF SYSTEMS: [name] is not exercising regularly.  She does take care of her 3 children and they do go outside frequently for example [place] went to [place] weekend when there was some good weather.  As noted above she has headaches almost every day now.  She sleeps poorly.  Her hair is thinning some.  She is not having hot flashes.  She certainly lacks energy and feels possibly depressed at  least her husband suggests that.  She denies any visual changes, nausea, vomiting, rash, or change in bowel or bladder habits. A detailed review of systems was otherwise entirely negative.   PAST MEDICAL HISTORY: Past Medical History:  Diagnosis Date  . Asthma   . Complication of anesthesia   . Endometriosis   . Family history of breast cancer   . Family history of prostate cancer   . GERD (gastroesophageal reflux disease)   . History of kidney infection during pregnancy   . PONV (postoperative nausea and vomiting)     PAST SURGICAL HISTORY: Past Surgical History:  Procedure Laterality Date  . CYSTOSCOPY N/A 10/04/2016   Procedure: CYSTOSCOPY;  Surgeon: Megan Salon, MD;  [facility];  Service: Gynecology;  Laterality: N/A;  . ENDOMETRIAL ABLATION    . fatty tumor removed    . MANDIBLE FRACTURE SURGERY    . REPAIR VAGINAL CUFF N/A 10/16/2016   Procedure: EXAM UNDER ANESTHESIA, REPAIR OF VAGINAL CUFF DEHISCENCE, DRAINGAGE OF PELVIC ABCESS;  Surgeon: [name], MD;  Location: [facility];  Service: Gynecology;  Laterality: N/A;  . TOTAL LAPAROSCOPIC HYSTERECTOMY WITH SALPINGECTOMY N/A 10/04/2016   Procedure: HYSTERECTOMY TOTAL LAPAROSCOPIC WITH BILATERAL SALPINGO-OOPHORECTOMY AND CYSTOSCOPY PERITONEAL BIOPSY;  Surgeon: Megan Salon, MD;  Location: [facility];  Service: Gynecology;  Laterality: N/A;  . WISDOM TOOTH EXTRACTION  12 years ago    FAMILY HISTORY: Family History  Problem Relation Age of Onset  . Hypertension Father   . Asthma Sister   . Diabetes Paternal Grandfather   . Diabetes Maternal Grandmother   . Breast cancer Maternal Grandmother        60's  . Breast cancer Mother 74  . Lymphoma Paternal Grandmother        Non Hodgskins Lymphoma  . Breast cancer Other 45  . Lymphoma Other 25  . Breast cancer Other 48  . Anesthesia problems Neg Hx   . Hypotension Neg Hx   . Malignant hyperthermia Neg Hx   . Pseudochol  deficiency Neg Hx    As of December 2020 the patient's father is 4 year old.  He had 2 sisters, neither of them with cancer, but a daughter of 1 of those sisters has had a history of thyroid cancer (patient's cousin).  The patient's father had no brothers.  His mother, the patient's paternal grandmother had non-Hodgkin's lymphoma.  The patient's mother is 51 years old.  She had breast cancer at the age of 54.  Her mother had breast cancer in her 10s and then again in her 87s.  The patient's mother has no sister, 3 brothers, none of whom have breast cancer.  As noted above the patient's mother has been genetically tested and does not carry a definable mutation.  There are no other cancers in the family to the patient's knowledge.   GYNECOLOGIC HISTORY:  Patient's last menstrual period was 07/03/2013. Menarche: 33 years old Age at first live birth: 33 years old [place] P 3 LMP age 89 HRT yes, currently taking  Hysterectomy? Yes, 10/2016 for severe endometriosis and vaginal cuff dehiscence. BSO? 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 She remarried in 01/2019. Husband Kirsten Fischer is an ENT currently studying to be an Therapist, sports.  He wants  to be a Archivist.Marland Kitchen She lives at home with her three sons. Her oldest, Nino Parsley, is 22; his father is in prison. She shares custody of her two younger sons, Saundra Shelling age 75 and Preston Vaughn 7, with their father. She attends a local [place] DIRECTIVES: In the absence of any documentation to the contrary, the patient's spouse is their HCPOA.   HEALTH MAINTENANCE: Social History   Tobacco Use  . Smoking status: Former Smoker    Types: Cigarettes    Quit date: 04/20/2013    Years since quitting: 5.9  . Smokeless tobacco: Never Used  Substance Use Topics  . Alcohol use: Not Currently    Alcohol/week: 0.0 standard drinks  . Drug use: No     Colonoscopy: n/a (age)  PAP: Status  post hysterectomy  Bone density: n/a (age)   Allergies  Allergen Reactions  . Nitrofurantoin Hives  . Atomoxetine Other (See Comments)    depression    Current Outpatient Medications  Medication Sig Dispense Refill  . albuterol (PROVENTIL HFA;VENTOLIN HFA) 108 (90 Base) MCG/ACT inhaler Inhale 1-2 puffs into the lungs every 6 (six) hours as needed for wheezing or shortness of breath.    . estradiol (VIVELLE-DOT) 0.05 MG/24HR patch Place 1 patch onto the skin 2 (two) times a week.    . montelukast (SINGULAIR) 10 MG tablet Take 1 tablet (10 mg total) by mouth at bedtime. 90 tablet 4  . albuterol (PROVENTIL) (2.5 MG/3ML) 0.083% nebulizer solution Take 2.5 mg by nebulization every 4 (four) hours as needed.      No current facility-administered medications for this visit.    OBJECTIVE: Young white woman who appears stated age  51:   03/21/19 1552  BP: 112/86  Pulse: 82  Resp: 18  Temp: 97.8 F (36.6 C)  SpO2: 100%     Body mass index is 26.2 kg/m.   Wt Readings from Last 3 Encounters:  03/21/19 172 lb 4.8 oz (78.2 kg)  02/15/19 173 lb 6.4 oz (78.7 kg)  11/08/18 168 lb 9.6 oz (76.5 kg)      ECOG FS:1 - Symptomatic but completely ambulatory  Ocular: Sclerae unicteric, EOMs intact Ear-nose-throat: Wearing a mask Lymphatic: No cervical or supraclavicular adenopathy Lungs no rales or rhonchi Heart regular rate and rhythm Abd soft, nontender, positive bowel sounds MSK no focal spinal tenderness, no joint edema Neuro: non-focal, well-oriented, mildly dysthymic affect Breasts: Breasts are appropriately lumpy for age, there are no findings suspicious for malignancy.  Both axillae are benign.   LAB RESULTS:  CMP     Component Value Date/Time   NA 140 10/28/2016 1616   K 4.9 10/28/2016 1616   CL 102 10/28/2016 1616   CO2 21 10/28/2016 1616   GLUCOSE 93 10/28/2016 1616   GLUCOSE 165 (H) 10/05/2016 0442   BUN 14 10/28/2016 1616   CREATININE 0.77 10/28/2016 1616    CREATININE 0.68 08/16/2016 1046   CALCIUM 9.7 10/28/2016 1616   PROT 7.8 10/28/2016 1616   ALBUMIN 4.7 10/28/2016 1616   AST 10 10/28/2016 1616   ALT 19 10/28/2016 1616   ALKPHOS 88 10/28/2016 1616   BILITOT 0.4 10/28/2016 1616   GFRNONAA 102 10/28/2016 1616   GFRNONAA >89 06/29/2012 1454   GFRAA 117 10/28/2016 1616   GFRAA >89 06/29/2012 1454    No results found for: TOTALPROTELP, ALBUMINELP, A1GS, A2GS, BETS, BETA2SER, GAMS, MSPIKE, SPEI  No results found for: KPAFRELGTCHN, LAMBDASER, KAPLAMBRATIO  Lab Results  Component Value Date   WBC 4.3 12/07/2016   NEUTROABS 2.0 12/07/2016   HGB 13.3 12/07/2016   HCT 38.0 12/07/2016   MCV 80 12/07/2016   PLT 197 12/07/2016    _0 @  No results found for: LABCA2  No components found for: [id number]  No results for input(s): INR in the last 168 hours.  No results found for: LABCA2  No results found for: WUJ811  No results found for: BJY782  No results found for: NFA213  No results found for: CA2729  No components found for: HGQUANT  No results found for: CEA1 / No results found for: CEA1   No results found for: AFPTUMOR  No results found for: CHROMOGRNA  No results found for: PSA1  No visits with results within 3 Day(s) from this visit.  Latest known visit with results is:  Office Visit on 02/13/2018  Component Date Value Ref Range Status  . Adequacy 02/13/2018 Satisfactory for evaluation.   Final  . Diagnosis 02/13/2018 NEGATIVE FOR INTRAEPITHELIAL LESIONS OR MALIGNANCY.   Final  . Chlamydia 02/13/2018 Negative   Final   Normal Reference Range - Negative  . Neisseria Gonorrhea 02/13/2018 Negative   Final   Normal Reference Range - Negative  . HPV 02/13/2018 NOT DETECTED   Final   Normal Reference Range - NOT Detected  . Trichomonas 02/13/2018 Negative   Final   Normal Reference Range - Negative  . Material Submitted 02/13/2018 Vaginal Pap [ThinPrep Imaged]   Final  . HIV Screen 4th Generation wRfx  02/13/2018 Non Reactive  Non Reactive Final  . RPR Ser Ql 02/13/2018 Non Reactive  Non Reactive Final    (this displays the last labs from the last 3 days)  No results found for: TOTALPROTELP, ALBUMINELP, A1GS, A2GS, BETS, BETA2SER, GAMS, MSPIKE, SPEI (this displays SPEP labs)  No results found for: KPAFRELGTCHN, LAMBDASER, KAPLAMBRATIO (kappa/lambda light chains)  No results found for: HGBA, HGBA2QUANT, HGBFQUANT, HGBSQUAN (Hemoglobinopathy evaluation)   No results found for: LDH  Lab Results  Component Value Date   IRON 109 06/29/2012   TIBC 322 06/29/2012   IRONPCTSAT 34 06/29/2012   (Iron and TIBC)  Lab Results  Component Value Date   FERRITIN 235 10/18/2016    Urinalysis    Component Value Date/Time   COLORURINE YELLOW 05/08/2015 0825   APPEARANCEUR CLOUDY (A) 05/08/2015 0825   LABSPEC 1.008 05/08/2015 0825   PHURINE 6.0 05/08/2015 0825   GLUCOSEU NEGATIVE 05/08/2015 0825   HGBUR NEGATIVE 05/08/2015 0825   BILIRUBINUR N 12/07/2016 0912   KETONESUR NEGATIVE 05/08/2015 0825   PROTEINUR N 12/07/2016 0912   PROTEINUR NEGATIVE 05/08/2015 0825   UROBILINOGEN 0.2 12/07/2016 0912   UROBILINOGEN 4.0 (H) 01/09/2015 1815   NITRITE N 12/07/2016 0912   NITRITE NEGATIVE 05/08/2015 0825   LEUKOCYTESUR Negative 12/07/2016 0912     STUDIES: No results found.  ELIGIBLE FOR AVAILABLE RESEARCH PROTOCOL:no  ASSESSMENT: 33 y.o. Kirsten Fischer, [place] woman at high risk for breast cancer, with a history of endometriosis  (1) breast cancer high risk: Estimated 25-30%  (a) mother and grandmother with breast cancers, age of onset early 103s  (b) breast density category D  (c) on combined hormone replacement  (2) endometriosis: Responding to combined hormone replacement  (3) considering intensified screening:  (a) yearly mammography with tomography  (b) yearly breast MRI  (c) bi-annual physician breast exam  PLAN: I spent approximately 60 minutes face to face with Branna with  more than 50% of that time spent  in counseling and coordination of care. Specifically we reviewed the biology of the patient's diagnosis and the specifics of her situation.  Despite the radical surgery she underwent 2 years ago she still has endometriosis. That is the reason she receives progesterone in addition to her post BSO estrogen replacement.  She was doing fairly well on that combination although she was still experiencing some menopausal symptoms including some hair loss, some weight gain, and some mood changes.   More recently after Zameria's mother was found to have breast cancer and Carliss's breast cancer risk was felt to be high, the progesterone component was discontinued, since there is data that combined hormone replacement does increase the risk of breast cancer (while estrogen replacement alone does not).  She has felt considerably worse since the progesterone was discontinued, possibly because of loss of control over the endometriosis.  Of course estrogen alone can make the endometriosis worse  In short her choices are to go back to combined estrogen and progesterone replacement and deal with the increased risk of breast cancer, or go off estrogen as well as progesterone and accept the symptoms of menopause.  She could become even less estrogenic by adding anastrozole.  We reviewed the fact that menopause is associated with hot flashes vaginal dryness including problems with dyspareunia, hair thinning, bone thinning, insomnia, mood changes, and weight gain.  In addition early menopause increases the risk of heart disease and osteoporosis.  After reviewing this sad litany Azhane really does not want to enter early menopause.   It seems better to her and to me that she go back to the estrogen and progesterone combination she was taking previously (and which can be tweaked in any number of way so she feels more "normal").  This will increase her risk of breast cancer but the way to deal with  that is with intensified screening.  We discussed the fact that her mammograms are not informative since she has density D breasts.  She would have nevertheless mammograms yearly and then 6 months later she would have breast MRI.  The concerns with breast MRI aside from cost are false positives and she might end up with some biopsies initially but after a couple of years in my experience the false positives diminish greatly.  She understands that in terms of survival and safety this is as good as having both her breast removed and that incidentally is an option that I would not recommend for her.  With regards to her lifetime breast cancer risk, the [facility] model calculates a 19% risk.  It does not take into account the patient's breast density or the fact that she will be taking hormone replacement.  Given those in addition I would calculate her lifetime risk in the 25 to 30% range.  That qualifies her for intensified screening.  At this point then Ameliyah is going to return to her gynecologist and endocrinologist and continue to work at adjusting her hormone replacement to optimal benefit in terms of quality of life.  She and her husband will let me know if they wish me to operationalize the intensified screening strategy but that certainly can be done through her gynecologist as well, so I am not making a return appointment for her here, but I will be glad to see her again or facilitate her treatment at any time in the future on an as-needed basis.  Chauncey Cruel, MD   03/21/2019 5:30 PM Medical Oncology and Hematology [address]  [place], [place] [address] Tel. [phone]    Fax. [phone]   This document serves as a record of services personally performed by Lurline Del, MD. It was created on his behalf by Wilburn Mylar, a trained medical scribe. The creation of this record is based on the scribe's personal observations and the provider's statements to  them.   I, Lurline Del MD, have reviewed the above documentation for accuracy and completeness, and I agree with the above.

## 2019-03-21 ENCOUNTER — Other Ambulatory Visit: Payer: Self-pay

## 2019-03-21 ENCOUNTER — Inpatient Hospital Stay: Payer: BC Managed Care – PPO | Attending: Oncology | Admitting: Oncology

## 2019-03-21 VITALS — BP 112/86 | HR 82 | Temp 97.8°F | Resp 18 | Ht 68.0 in | Wt 172.3 lb

## 2019-03-21 DIAGNOSIS — Z87891 Personal history of nicotine dependence: Secondary | ICD-10-CM | POA: Insufficient documentation

## 2019-03-21 DIAGNOSIS — Z90710 Acquired absence of both cervix and uterus: Secondary | ICD-10-CM | POA: Diagnosis not present

## 2019-03-21 DIAGNOSIS — N809 Endometriosis, unspecified: Secondary | ICD-10-CM | POA: Diagnosis not present

## 2019-03-21 DIAGNOSIS — Z90722 Acquired absence of ovaries, bilateral: Secondary | ICD-10-CM | POA: Diagnosis not present

## 2019-03-21 DIAGNOSIS — Z803 Family history of malignant neoplasm of breast: Secondary | ICD-10-CM | POA: Diagnosis not present

## 2019-03-21 DIAGNOSIS — Z9189 Other specified personal risk factors, not elsewhere classified: Secondary | ICD-10-CM | POA: Insufficient documentation

## 2019-03-21 DIAGNOSIS — R519 Headache, unspecified: Secondary | ICD-10-CM | POA: Insufficient documentation

## 2019-03-24 ENCOUNTER — Other Ambulatory Visit: Payer: Self-pay | Admitting: Oncology

## 2019-03-24 DIAGNOSIS — N809 Endometriosis, unspecified: Secondary | ICD-10-CM

## 2019-03-24 DIAGNOSIS — Z803 Family history of malignant neoplasm of breast: Secondary | ICD-10-CM

## 2019-03-24 DIAGNOSIS — Z9189 Other specified personal risk factors, not elsewhere classified: Secondary | ICD-10-CM

## 2019-03-24 NOTE — Progress Notes (Signed)
Patient called 03/23/2019 requesting we proceed with MRI screening especially ASAP due to insurance issues.  I have gone ahead and placed the order

## 2019-04-19 ENCOUNTER — Ambulatory Visit
Admission: RE | Admit: 2019-04-19 | Discharge: 2019-04-19 | Disposition: A | Discharge status: Home / Self Care | Payer: BC Managed Care – PPO | Source: Ambulatory Visit | Attending: Oncology | Admitting: Oncology

## 2019-04-19 DIAGNOSIS — N809 Endometriosis, unspecified: Secondary | ICD-10-CM

## 2019-04-19 DIAGNOSIS — Z803 Family history of malignant neoplasm of breast: Secondary | ICD-10-CM

## 2019-04-19 DIAGNOSIS — Z9189 Other specified personal risk factors, not elsewhere classified: Secondary | ICD-10-CM

## 2019-04-19 MED ORDER — GADOBUTROL 1 MMOL/ML IV SOLN
7.0000 mL | Freq: Once | INTRAVENOUS | Status: AC | PRN
  Administered 2019-04-19: 7 mL via INTRAVENOUS

## 2019-04-20 ENCOUNTER — Other Ambulatory Visit: Payer: Self-pay | Admitting: Oncology

## 2019-04-24 ENCOUNTER — Telehealth: Payer: Self-pay | Admitting: *Deleted

## 2019-04-24 ENCOUNTER — Other Ambulatory Visit: Payer: Self-pay | Admitting: *Deleted

## 2019-04-24 DIAGNOSIS — Z9189 Other specified personal risk factors, not elsewhere classified: Secondary | ICD-10-CM

## 2019-04-25 ENCOUNTER — Other Ambulatory Visit: Payer: Self-pay | Admitting: *Deleted

## 2019-04-25 ENCOUNTER — Other Ambulatory Visit: Payer: Self-pay | Admitting: Oncology

## 2019-04-25 ENCOUNTER — Telehealth: Payer: Self-pay | Admitting: *Deleted

## 2019-04-25 DIAGNOSIS — Z9189 Other specified personal risk factors, not elsewhere classified: Secondary | ICD-10-CM

## 2019-04-25 NOTE — Telephone Encounter (Signed)
This RN received call from pt stating she is was told yesterday she would get a call today about scheduling a biopsy.  Noted order placed yesterday for MRI guided biopsy per navigator due to abnormal MRI for high risk.  This RN informed pt - [facility] for [facility] and pt contact.  This RN [facility] to follow up on order - spoke with Victorino Dike who stated she will contact the patient later today.

## 2019-04-25 NOTE — Telephone Encounter (Signed)
No entry 

## 2019-05-07 ENCOUNTER — Other Ambulatory Visit: Payer: Self-pay | Admitting: Diagnostic Radiology

## 2019-05-07 ENCOUNTER — Ambulatory Visit
Admission: RE | Admit: 2019-05-07 | Discharge: 2019-05-07 | Disposition: A | Discharge status: Home / Self Care | Payer: BC Managed Care – PPO | Source: Ambulatory Visit | Attending: Oncology | Admitting: Oncology

## 2019-05-07 ENCOUNTER — Other Ambulatory Visit: Payer: Self-pay

## 2019-05-07 DIAGNOSIS — Z9189 Other specified personal risk factors, not elsewhere classified: Secondary | ICD-10-CM

## 2019-05-07 HISTORY — PX: BREAST BIOPSY: SHX20

## 2019-05-07 MED ORDER — GADOBUTROL 1 MMOL/ML IV SOLN
8.0000 mL | Freq: Once | INTRAVENOUS | Status: AC | PRN
  Administered 2019-05-07: 10:00:00 8 mL via INTRAVENOUS

## 2019-11-20 ENCOUNTER — Other Ambulatory Visit: Payer: Self-pay | Admitting: Oncology

## 2019-11-20 DIAGNOSIS — N6019 Diffuse cystic mastopathy of unspecified breast: Secondary | ICD-10-CM

## 2019-11-20 DIAGNOSIS — N6099 Unspecified benign mammary dysplasia of unspecified breast: Secondary | ICD-10-CM

## 2019-11-22 ENCOUNTER — Other Ambulatory Visit: Payer: Self-pay | Admitting: Oncology

## 2019-11-22 DIAGNOSIS — Z1231 Encounter for screening mammogram for malignant neoplasm of breast: Secondary | ICD-10-CM

## 2019-12-14 ENCOUNTER — Other Ambulatory Visit: Payer: BC Managed Care – PPO

## 2019-12-17 ENCOUNTER — Other Ambulatory Visit: Payer: Self-pay | Admitting: Oncology

## 2019-12-18 ENCOUNTER — Other Ambulatory Visit: Payer: Self-pay | Admitting: Oncology

## 2019-12-19 ENCOUNTER — Other Ambulatory Visit: Payer: Self-pay

## 2019-12-19 ENCOUNTER — Ambulatory Visit
Admission: RE | Admit: 2019-12-19 | Discharge: 2019-12-19 | Disposition: A | Discharge status: Home / Self Care | Payer: BC Managed Care – PPO | Source: Ambulatory Visit | Attending: Oncology | Admitting: Oncology

## 2019-12-19 DIAGNOSIS — N6019 Diffuse cystic mastopathy of unspecified breast: Secondary | ICD-10-CM

## 2019-12-19 DIAGNOSIS — N6099 Unspecified benign mammary dysplasia of unspecified breast: Secondary | ICD-10-CM

## 2019-12-19 MED ORDER — GADOBUTROL 1 MMOL/ML IV SOLN
7.0000 mL | Freq: Once | INTRAVENOUS | Status: AC | PRN
  Administered 2019-12-19: 7 mL via INTRAVENOUS

## 2020-02-18 NOTE — Progress Notes (Addendum)
34 y.o. [id number] Married White or Caucasian Not Hispanic or Latino female here for annual exam. patient states that she feels as if her endometriosis has come back and would like to discuss.    H/O TLH/BSO with Dr Hyacinth Meeker in 7/18 for severe endo. Endocrinology has been managing her HRT.  She c/o new onset deep dyspareunia in the last 3-4 months. Occurs every time, not positional. Feels like her endometriosis pain. She is having worsening lower back pain (R>L) radiates to her hip and down her right leg. Feels like her endometriosis pain. All resolved after her hysterectomy, has been present for 2 years, much worse in the last couple of months. Hurting every day. Hasn't had any imaging.   She is doing okay with her HRT overall. Tolerable hot flashes and night sweats.   She struggles with stress, gets emotional. Not depressed. Marriage is great.   FH of breast cancer, has been seen at high risk breast clinic. Dr Darnelle Catalan feels her breast cancer risk is 25-30%.     Patient's last menstrual period was 07/03/2013.          Sexually active: Yes.    The current method of family planning is status post hysterectomy.    Exercising: No.  The patient does not participate in regular exercise at present. 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 HR HPV: +Detected. 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 Her smoking use included cigarettes. She has never used smokeless tobacco. She reports previous alcohol use. She reports that she does not use drugs.  Past Medical History:  Diagnosis Date  . Asthma   . Complication of anesthesia   . Endometriosis   . Family history of breast cancer   . Family history of prostate cancer   . GERD  (gastroesophageal reflux disease)   . History of kidney infection during pregnancy   . PONV (postoperative nausea and vomiting)     Past Surgical History:  Procedure Laterality Date  . BREAST BIOPSY Left 05/07/2019  . CYSTOSCOPY N/A 10/04/2016   Procedure: CYSTOSCOPY;  Surgeon: Jerene Bears, MD;  Location: [facility];  Service: Gynecology;  Laterality: N/A;  . ENDOMETRIAL ABLATION    . fatty tumor removed    . MANDIBLE FRACTURE SURGERY    . REPAIR VAGINAL CUFF N/A 10/16/2016   Procedure: EXAM UNDER ANESTHESIA, REPAIR OF VAGINAL CUFF DEHISCENCE, DRAINGAGE OF PELVIC ABCESS;  Surgeon: Romualdo Bolk, MD;  Location: [facility];  Service: Gynecology;  Laterality: N/A;  . TOTAL LAPAROSCOPIC HYSTERECTOMY WITH SALPINGECTOMY N/A 10/04/2016   Procedure: HYSTERECTOMY TOTAL LAPAROSCOPIC WITH BILATERAL SALPINGO-OOPHORECTOMY AND CYSTOSCOPY PERITONEAL BIOPSY;  Surgeon: Jerene Bears, MD;  Location: [facility];  Service: Gynecology;  Laterality: N/A;  . WISDOM TOOTH EXTRACTION  12 years ago    Current Outpatient Medications  Medication Sig Dispense Refill  . albuterol (PROVENTIL HFA;VENTOLIN HFA) 108 (90 Base) MCG/ACT inhaler Inhale 1-2 puffs into the lungs every 6 (six) hours as needed for wheezing or shortness of breath.    Marland Kitchen albuterol (PROVENTIL) (2.5 MG/3ML) 0.083% nebulizer solution Take 2.5 mg by nebulization every 4 (four) hours as needed.     Marland Kitchen amphetamine-dextroamphetamine (ADDERALL)  20 MG tablet Take by mouth.    . estradiol (VIVELLE-DOT) 0.05 MG/24HR patch Place 1 patch onto the skin 2 (two) times a week.    . fish oil-omega-3 fatty acids 1000 MG capsule Take by mouth.    . montelukast (SINGULAIR) 10 MG tablet Take 1 tablet (10 mg total) by mouth at bedtime. 90 tablet 4  . Multiple Vitamin (MULTIVITAMIN ADULT PO) Take by mouth.    . progesterone (PROMETRIUM) 100 MG capsule Take by mouth.     No current facility-administered medications for this visit.     Family History  Problem Relation Age of Onset  . Hypertension Father   . Asthma Sister   . Diabetes Paternal Grandfather   . Diabetes Maternal Grandmother   . Breast cancer Maternal Grandmother        60's  . Breast cancer Mother 17  . Lymphoma Paternal Grandmother        Non Hodgskins Lymphoma  . Breast cancer Other 62  . Lymphoma Other 25  . Breast cancer Other 48  . Anesthesia problems Neg Hx   . Hypotension Neg Hx   . Malignant hyperthermia Neg Hx   . Pseudochol deficiency Neg Hx     Review of Systems  Constitutional: Negative.   HENT: Negative.   Eyes: Negative.   Respiratory: Negative.   Cardiovascular: Negative.   Gastrointestinal: Negative.   Endocrine: Negative.   Genitourinary: Negative.   Musculoskeletal: Negative.   Skin: Negative.   Allergic/Immunologic: Negative.   Neurological: Negative.   Hematological: Negative.   Psychiatric/Behavioral: Negative.     Exam:   BP 118/80 (BP Location: Left Arm, Patient Position: Sitting, Cuff Size: Normal)   Pulse 92   Resp 16   Ht 5\' 7"  (1.702 m)   Wt 171 lb (77.6 kg)   LMP 07/03/2013   BMI 26.78 kg/m   Weight change: @WEIGHTCHANGE @ Height:   Height: 5\' 7"  (170.2 cm)  Ht Readings from Last 3 Encounters:  02/20/20 5\' 7"  (1.702 m)  03/21/19 5\' 8"  (1.727 m)  02/15/19 5\' 8"  (1.727 m)    General appearance: alert, cooperative and appears stated age Head: Normocephalic, without obvious abnormality, atraumatic Neck: no adenopathy, supple, symmetrical, trachea midline and thyroid normal to inspection and palpation Lungs: clear to auscultation bilaterally Cardiovascular: regular rate and rhythm Breasts: normal appearance, no masses or tenderness Abdomen: soft, non-tender; non distended,  no masses,  no organomegaly Extremities: extremities normal, atraumatic, no cyanosis or edema Skin: Skin color, texture, turgor normal. No rashes or lesions Lymph nodes: Cervical, supraclavicular, and axillary nodes  normal. No abnormal inguinal nodes palpated Neurologic: Grossly normal   Pelvic: External genitalia:  no lesions              Urethra:  normal appearing urethra with no masses, tenderness or lesions              Bartholins and Skenes: normal                 Vagina: normal appearing vagina with normal color and discharge, no lesions              Cervix: absent               Bimanual Exam:  Uterus:  uterus absent              Adnexa: no mass, fullness, tenderness               Rectovaginal: Confirms  Anus:  normal sphincter tone, no lesions  Pelvic floor: tender on the right  Carolynn Serve chaperoned for the exam.  A:  Well Woman with normal exam  H/O severe endometriosis, s/p TLH/BSO, residual endometriosis still presnet at the end of her surgery. She has been on estrogen and progesterone. Feels she can't tolerate being off of the estrogen   Dyspareunia, some pelvic flood tenderness on the right    P:    Discussed the option of going off the estrogen for a few months to see if that helps, she doesn't feel she can tolerate going off of the estrogen. We discussed increase her progesterone to see if that helps. She has been very sensitive to hormonal changes.   She would like possible surgical treatment  Will refer to Dr Andrey Farmer for a consultation for possible laparoscopy and removal of any residual endometriosis  Labs UTD  Discussed breast self exam  Discussed calcium and vit D intake  Referral to pelvic floor PT  No pap this year (h/o cervical dysplasia so will still need paps).    In addition to her annual exam, over 10 minutes was spent discussing her pain and possible treatment options.   Addendum: the patient was noted to have an increase in white watery vaginal d/c. On questioning she does report an increase in vaginal d/c. Nuswab vaginitis swab sent.

## 2020-02-20 ENCOUNTER — Other Ambulatory Visit: Payer: Self-pay

## 2020-02-20 ENCOUNTER — Ambulatory Visit (INDEPENDENT_AMBULATORY_CARE_PROVIDER_SITE_OTHER): Payer: 59 | Admitting: Obstetrics and Gynecology

## 2020-02-20 ENCOUNTER — Encounter: Payer: Self-pay | Admitting: Obstetrics and Gynecology

## 2020-02-20 VITALS — BP 118/80 | HR 92 | Resp 16 | Ht 67.0 in | Wt 171.0 lb

## 2020-02-20 DIAGNOSIS — Z79890 Hormone replacement therapy (postmenopausal): Secondary | ICD-10-CM | POA: Diagnosis not present

## 2020-02-20 DIAGNOSIS — Z01419 Encounter for gynecological examination (general) (routine) without abnormal findings: Secondary | ICD-10-CM | POA: Diagnosis not present

## 2020-02-20 DIAGNOSIS — Z8742 Personal history of other diseases of the female genital tract: Secondary | ICD-10-CM | POA: Diagnosis not present

## 2020-02-20 DIAGNOSIS — N898 Other specified noninflammatory disorders of vagina: Secondary | ICD-10-CM

## 2020-02-20 DIAGNOSIS — Z90710 Acquired absence of both cervix and uterus: Secondary | ICD-10-CM | POA: Diagnosis not present

## 2020-02-20 DIAGNOSIS — R102 Pelvic and perineal pain: Secondary | ICD-10-CM

## 2020-02-20 DIAGNOSIS — F39 Unspecified mood [affective] disorder: Secondary | ICD-10-CM

## 2020-02-20 DIAGNOSIS — Z90722 Acquired absence of ovaries, bilateral: Secondary | ICD-10-CM

## 2020-02-20 DIAGNOSIS — Z9079 Acquired absence of other genital organ(s): Secondary | ICD-10-CM | POA: Diagnosis not present

## 2020-02-20 NOTE — Addendum Note (Signed)
Addended by: Tobi Bastos on: 02/20/2020 04:34 PM   Modules accepted: Orders

## 2020-02-20 NOTE — Patient Instructions (Signed)
EXERCISE   We recommended that you start or continue a regular exercise program for good health. Physical activity is anything that gets your body moving, some is better than none. The CDC recommends 150 minutes per week of Moderate-Intensity Aerobic Activity and 2 or more days of Muscle Strengthening Activity.  Benefits of exercise are limitless: helps weight loss/weight maintenance, improves mood and energy, helps with depression and anxiety, improves sleep, tones and strengthens muscles, improves balance, improves bone density, protects from chronic conditions such as heart disease, high blood pressure and diabetes and so much more. To learn more visit: https://www.cdc.gov/physicalactivity/index.html  DIET: Good nutrition starts with a healthy diet of fruits, vegetables, whole grains, and lean protein sources. Drink plenty of water for hydration. Minimize empty calories, sodium, sweets. For more information about dietary recommendations visit: https://health.gov/our-work/nutrition-physical-activity/dietary-guidelines and https://www.myplate.gov/  ALCOHOL:  Women should limit their alcohol intake to no more than 7 drinks/beers/glasses of wine (combined, not each!) per week. Moderation of alcohol intake to this level decreases your risk of breast cancer and liver damage.  If you are concerned that you may have a problem, or your friends have told you they are concerned about your drinking, there are many resources to help. A well-known program that is free, effective, and available to all people all over the nation is Alcoholics Anonymous.  Check out this site to learn more: https://www.aa.org/   CALCIUM AND VITAMIN D:  Adequate intake of calcium and Vitamin D are recommended for bone health.  The recommendations for exact amounts of these supplements seem to change often, but generally speaking 1000-1500 mg of calcium (between diet and supplement) and 800 units of Vitamin D per day seems prudent.      PAP SMEARS:  Pap smears, to check for cervical cancer or precancers,  have traditionally been done yearly, although recent scientific advances have shown that most women can have pap smears less often.  However, every woman still should have a physical exam from her gynecologist every year. It will include a breast check, inspection of the vulva and vagina to check for abnormal growths or skin changes, a visual exam of the cervix, and then an exam to evaluate the size and shape of the uterus and ovaries.  And after 36 years of age, a rectal exam is indicated to check for rectal cancers. We will also provide age appropriate advice regarding health maintenance, like when you should have certain vaccines, screening for sexually transmitted diseases, bone density testing, colonoscopy, mammograms, etc.   MAMMOGRAMS:  All women over 40 years old should have a routine mammogram.   COLON CANCER SCREENING: Now recommend starting at age 45. At this time colonoscopy is not covered for routine screening until 50. There are take home tests that can be done between 45-49.   COLONOSCOPY:  Colonoscopy to screen for colon cancer is recommended for all women at age 50.  We know, you hate the idea of the prep.  We agree, BUT, having colon cancer and not knowing it is worse!!  Colon cancer so often starts as a polyp that can be seen and removed at colonscopy, which can quite literally save your life!  And if your first colonoscopy is normal and you have no family history of colon cancer, most women don't have to have it again for 10 years.  Once every ten years, you can do something that may end up saving your life, right?  We will be happy to help you get it scheduled when   you are ready.  Be sure to check your insurance coverage so you understand how much it will cost.  It may be covered as a preventative service at no cost, but you should check your particular policy.      Breast Self-Awareness Breast self-awareness  means being familiar with how your breasts look and feel. It involves checking your breasts regularly and reporting any changes to your health care provider. Practicing breast self-awareness is important. A change in your breasts can be a sign of a serious medical problem. Being familiar with how your breasts look and feel allows you to find any problems early, when treatment is more likely to be successful. All women should practice breast self-awareness, including women who have had breast implants. How to do a breast self-exam One way to learn what is normal for your breasts and whether your breasts are changing is to do a breast self-exam. To do a breast self-exam: Look for Changes  1. Remove all the clothing above your waist. 2. Stand in front of a mirror in a room with good lighting. 3. Put your hands on your hips. 4. Push your hands firmly downward. 5. Compare your breasts in the mirror. Look for differences between them (asymmetry), such as: ? Differences in shape. ? Differences in size. ? Puckers, dips, and bumps in one breast and not the other. 6. Look at each breast for changes in your skin, such as: ? Redness. ? Scaly areas. 7. Look for changes in your nipples, such as: ? Discharge. ? Bleeding. ? Dimpling. ? Redness. ? A change in position. Feel for Changes Carefully feel your breasts for lumps and changes. It is best to do this while lying on your back on the floor and again while sitting or standing in the shower or tub with soapy water on your skin. Feel each breast in the following way:  Place the arm on the side of the breast you are examining above your head.  Feel your breast with the other hand.  Start in the nipple area and make  inch (2 cm) overlapping circles to feel your breast. Use the pads of your three middle fingers to do this. Apply light pressure, then medium pressure, then firm pressure. The light pressure will allow you to feel the tissue closest to the  skin. The medium pressure will allow you to feel the tissue that is a little deeper. The firm pressure will allow you to feel the tissue close to the ribs.  Continue the overlapping circles, moving downward over the breast until you feel your ribs below your breast.  Move one finger-width toward the center of the body. Continue to use the  inch (2 cm) overlapping circles to feel your breast as you move slowly up toward your collarbone.  Continue the up and down exam using all three pressures until you reach your armpit.  Write Down What You Find  Write down what is normal for each breast and any changes that you find. Keep a written record with breast changes or normal findings for each breast. By writing this information down, you do not need to depend only on memory for size, tenderness, or location. Write down where you are in your menstrual cycle, if you are still menstruating. If you are having trouble noticing differences in your breasts, do not get discouraged. With time you will become more familiar with the variations in your breasts and more comfortable with the exam. How often should I examine   my breasts? Examine your breasts every month. If you are breastfeeding, the best time to examine your breasts is after a feeding or after using a breast pump. If you menstruate, the best time to examine your breasts is 5-7 days after your period is over. During your period, your breasts are lumpier, and it may be more difficult to notice changes. When should I see my health care provider? See your health care provider if you notice:  A change in shape or size of your breasts or nipples.  A change in the skin of your breast or nipples, such as a reddened or scaly area.  Unusual discharge from your nipples.  A lump or thick area that was not there before.  Pain in your breasts.  Anything that concerns you.  

## 2020-02-23 LAB — NUSWAB VAGINITIS (VG)
Atopobium vaginae: HIGH Score — AB
BVAB 2: HIGH Score — AB
Candida albicans, NAA: NEGATIVE
Candida glabrata, NAA: NEGATIVE
Megasphaera 1: HIGH Score — AB
Trich vag by NAA: NEGATIVE

## 2020-02-25 ENCOUNTER — Other Ambulatory Visit: Payer: Self-pay

## 2020-02-25 ENCOUNTER — Telehealth: Payer: Self-pay

## 2020-02-25 DIAGNOSIS — Z90722 Acquired absence of ovaries, bilateral: Secondary | ICD-10-CM

## 2020-02-25 DIAGNOSIS — Z8742 Personal history of other diseases of the female genital tract: Secondary | ICD-10-CM

## 2020-02-25 DIAGNOSIS — B9689 Other specified bacterial agents as the cause of diseases classified elsewhere: Secondary | ICD-10-CM

## 2020-02-25 DIAGNOSIS — Z90710 Acquired absence of both cervix and uterus: Secondary | ICD-10-CM

## 2020-02-25 DIAGNOSIS — N760 Acute vaginitis: Secondary | ICD-10-CM

## 2020-02-25 DIAGNOSIS — Z9079 Acquired absence of other genital organ(s): Secondary | ICD-10-CM

## 2020-02-25 DIAGNOSIS — Z79890 Hormone replacement therapy (postmenopausal): Secondary | ICD-10-CM

## 2020-02-25 MED ORDER — METRONIDAZOLE 500 MG PO TABS: 500.0000 mg | ORAL_TABLET | Freq: Two times a day (BID) | ORAL | 0 refills | Status: DC

## 2020-02-25 NOTE — Telephone Encounter (Signed)
Kirsten Bolk, MD  P [facility] Triage Pool This patient would like a referral to Dr Andrey Farmer to discuss possible laparoscopy and removal of residual deep endometriosis. She underwent TLH/BSO by Dr Hyacinth Meeker several years ago and is on estrogen and progesterone therapy. Her endometriosis was extensive, pictures are in epic.

## 2020-02-25 NOTE — Telephone Encounter (Signed)
Referral placed per Dr Oscar La to Dr Andrey Farmer   CcMayme Genta for referral  Encounter closed

## 2020-03-14 NOTE — Telephone Encounter (Signed)
Pt needing new referral to [facility]  Minimally invasive GYN Surgery.  New referral placed.  Dr Andrey Farmer referral cancelled.  Cc: Hayley for update Encounter closed

## 2020-03-14 NOTE — Addendum Note (Signed)
Addended by: Isabell Jarvis on: 03/14/2020 04:32 PM   Modules accepted: Orders

## 2020-06-12 ENCOUNTER — Other Ambulatory Visit: Payer: Self-pay | Admitting: Oncology

## 2020-06-12 DIAGNOSIS — N63 Unspecified lump in unspecified breast: Secondary | ICD-10-CM

## 2020-06-16 ENCOUNTER — Inpatient Hospital Stay: Admission: RE | Admit: 2020-06-16 | Payer: BC Managed Care – PPO | Source: Ambulatory Visit

## 2020-07-08 ENCOUNTER — Ambulatory Visit
Admission: RE | Admit: 2020-07-08 | Discharge: 2020-07-08 | Disposition: A | Discharge status: Home / Self Care | Payer: 59 | Source: Ambulatory Visit | Attending: Oncology | Admitting: Oncology

## 2020-07-08 ENCOUNTER — Other Ambulatory Visit: Payer: Self-pay

## 2020-07-08 DIAGNOSIS — N63 Unspecified lump in unspecified breast: Secondary | ICD-10-CM

## 2021-03-02 NOTE — Progress Notes (Signed)
35 y.o. [id number] Married White or Caucasian Not Hispanic or Latino female here for annual exam.     H/O TLH/BSO with Dr Hyacinth Meeker in 7/18 for severe endo.  She developed recurrent pelvic pain and was seen at the [facility] minimally invasive surgery center last year. They recommended PT, which she only did once (can't miss work). She had a CT in 1/22:  --Unremarkable appearance of the appendix.Marland Kitchen  --Status post hysterectomy and bilateral salpingo-oophorectomy without suspicious adnexal lesions.  --Tiny nodular opacity along the anterior border of the bladder, indeterminate and possibly representing a vitelline duct remnant versus small endometrioma. Otherwise, no visualized evidence of endometriosis, though CT is relatively insensitive for the detection of endometriosis. MRI of the pelvis may be helpful for further evaluation if clinically warranted.  --Nonobstructing right nephrolithiasis.  --Tiny locules of air within the urinary bladder. Correlate for history of recent instrumentation versus evidence of cystitis No visualized evidence for vesicovaginal or colovesical fistula.   She wasn't told about the area on the border of her bladder.  Currently her baseline pain is in her lower back, lower abdominal tenderness. Overall she feels it is tolerable.   She c/o a 1 year h/o dyspareunia (it has been a while), not sure where the pain is. Certain positions hurt, varies in intensity.   Endocrinology has been managing her HRT. Doing okay.  No bowel or bladder c/o.   FH of breast cancer, has been seen at high risk breast clinic. Dr Darnelle Catalan feels her breast cancer risk is 25-30%.   She c/o a 1 month h/o bilateral breast tenderness. Not drinking caffeine.   Patient's last menstrual period was 07/03/2013.          Sexually active: Yes.    The current method of family planning is status post hysterectomy.    Exercising: No.  The patient does not participate in regular exercise at present. Smoker:   no  Health Maintenance: Pap: 02/13/2018 WNL NEG HPV, 08/16/2016 LSIL with positive HPV, 08/14/15 CIN1. HR HPV: +Detected. 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 Her smoking use included cigarettes. She has never used smokeless tobacco. She reports that she does not currently use alcohol. She reports that she does not use drugs. 3 boys: 15, 12, & 9.   Past Medical History:  Diagnosis Date   Asthma    Complication of anesthesia    Endometriosis    Family history of breast cancer    Family history of prostate cancer    GERD (gastroesophageal reflux disease)    History of kidney infection during pregnancy    PONV (postoperative nausea and vomiting)     Past Surgical History:  Procedure Laterality Date   BREAST BIOPSY Left 05/07/2019   CYSTOSCOPY N/A 10/04/2016   Procedure: CYSTOSCOPY;  Surgeon: Jerene Bears, MD;  Location: [facility];  Service: Gynecology;  Laterality: N/A;   ENDOMETRIAL ABLATION     fatty tumor removed     MANDIBLE FRACTURE SURGERY     REPAIR VAGINAL CUFF N/A 10/16/2016   Procedure: EXAM UNDER ANESTHESIA, REPAIR OF VAGINAL CUFF DEHISCENCE, DRAINGAGE OF PELVIC ABCESS;  Surgeon: Romualdo Bolk, MD;  Location: [facility];  Service: Gynecology;  Laterality: N/A;  TOTAL LAPAROSCOPIC HYSTERECTOMY WITH SALPINGECTOMY N/A 10/04/2016   Procedure: HYSTERECTOMY TOTAL LAPAROSCOPIC WITH BILATERAL SALPINGO-OOPHORECTOMY AND CYSTOSCOPY PERITONEAL BIOPSY;  Surgeon: Jerene Bears, MD;  Location: [facility];  Service: Gynecology;  Laterality: N/A;   WISDOM TOOTH EXTRACTION  12 years ago    Current Outpatient Medications  Medication Sig Dispense Refill   albuterol (PROVENTIL HFA;VENTOLIN HFA) 108 (90 Base) MCG/ACT  inhaler Inhale 1-2 puffs into the lungs every 6 (six) hours as needed for wheezing or shortness of breath.     amphetamine-dextroamphetamine (ADDERALL) 20 MG tablet Take by mouth.     estradiol (VIVELLE-DOT) 0.05 MG/24HR patch Place 1 patch onto the skin 2 (two) times a week.     fish oil-omega-3 fatty acids 1000 MG capsule Take by mouth.     montelukast (SINGULAIR) 10 MG tablet Take 1 tablet (10 mg total) by mouth at bedtime. 90 tablet 4   Multiple Vitamin (MULTIVITAMIN ADULT PO) Take by mouth.     progesterone (PROMETRIUM) 100 MG capsule Take by mouth.     albuterol (PROVENTIL) (2.5 MG/3ML) 0.083% nebulizer solution Take 2.5 mg by nebulization every 4 (four) hours as needed.      No current facility-administered medications for this visit.    Family History  Problem Relation Age of Onset   Hypertension Father    Asthma Sister    Diabetes Paternal Grandfather    Diabetes Maternal Grandmother    Breast cancer Maternal Grandmother        28's   Breast cancer Mother 66   Lymphoma Paternal Grandmother        Non Hodgskins Lymphoma   Breast cancer Other 48   Lymphoma Other 25   Breast cancer Other 48   Anesthesia problems Neg Hx    Hypotension Neg Hx    Malignant hyperthermia Neg Hx    Pseudochol deficiency Neg Hx     Review of Systems  All other systems reviewed and are negative.  Exam:   BP 128/82   Pulse 89   Ht 5\' 8"  (1.727 m)   Wt 162 lb (73.5 kg)   LMP 07/03/2013   SpO2 99%   BMI 24.63 kg/m   Weight change: @WEIGHTCHANGE @ Height:   Height: 5\' 8"  (172.7 cm)  Ht Readings from Last 3 Encounters:  03/04/21 5\' 8"  (1.727 m)  02/20/20 5\' 7"  (1.702 m)  03/21/19 5\' 8"  (1.727 m)    General appearance: alert, cooperative and appears stated age Head: Normocephalic, without obvious abnormality, atraumatic Neck: no adenopathy, supple, symmetrical, trachea midline and thyroid normal to inspection and palpation Lungs: clear to auscultation bilaterally Cardiovascular: regular  rate and rhythm Breasts: normal appearance, no masses or tenderness Abdomen: soft, non-tender; non distended,  no masses,  no organomegaly Extremities: extremities normal, atraumatic, no cyanosis or edema Skin: Skin color, texture, turgor normal. No rashes or lesions Lymph nodes: Cervical, supraclavicular, and axillary nodes normal. No abnormal inguinal nodes palpated Neurologic: Grossly normal   Pelvic: External genitalia:  no lesions              Urethra:  normal appearing urethra with no masses, tenderness or lesions              Bartholins and Skenes: normal                 Vagina: normal appearing vagina with normal color and discharge, no lesions              Cervix: absent  Bimanual Exam:  Uterus:  uterus absent              Adnexa:  no masses, mildly tender on the right               Rectovaginal: Confirms  No nodularity               Anus:  normal sphincter tone, no lesions  No pelvic floor tenderness, no bladder tenderness  Carolynn Serve chaperoned for the exam.  1. Well woman exam Discussed breast self exam Discussed calcium and vit D intake Labs with Endocrinology and primary care  2. 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 Screening for vaginal cancer - Cytology - PAP with hpv  4. History of hysterectomy For severe endometriosis  5. History of bilateral salpingo-oophorectomy (BSO) For severe endometriosis  6. History of endometriosis  7. Hormone replacement therapy (HRT) Managed by Endocrinology  8. At high risk for breast cancer Getting yearly mammograms and MRI's MRI ordered She will f/u at the high risk breast clinic.   9. Breast tenderness For the last month, symmetrical. She has had a 10 lb weight loss. Recommended she make sure her bra is fitting appropriately, can use ice, tylenol or ibuprofen. She doesn't drink caffeine.

## 2021-03-04 ENCOUNTER — Other Ambulatory Visit (HOSPITAL_COMMUNITY)
Admission: RE | Admit: 2021-03-04 | Discharge: 2021-03-04 | Disposition: A | Discharge status: Home / Self Care | Payer: 59 | Source: Ambulatory Visit | Attending: Obstetrics and Gynecology | Admitting: Obstetrics and Gynecology

## 2021-03-04 ENCOUNTER — Encounter: Payer: Self-pay | Admitting: Obstetrics and Gynecology

## 2021-03-04 ENCOUNTER — Telehealth: Payer: Self-pay | Admitting: *Deleted

## 2021-03-04 ENCOUNTER — Ambulatory Visit (INDEPENDENT_AMBULATORY_CARE_PROVIDER_SITE_OTHER): Payer: 59 | Admitting: Obstetrics and Gynecology

## 2021-03-04 ENCOUNTER — Other Ambulatory Visit: Payer: Self-pay

## 2021-03-04 VITALS — BP 128/82 | HR 89 | Ht 68.0 in | Wt 162.0 lb

## 2021-03-04 DIAGNOSIS — Z1272 Encounter for screening for malignant neoplasm of vagina: Secondary | ICD-10-CM | POA: Insufficient documentation

## 2021-03-04 DIAGNOSIS — Z90710 Acquired absence of both cervix and uterus: Secondary | ICD-10-CM

## 2021-03-04 DIAGNOSIS — Z87410 Personal history of cervical dysplasia: Secondary | ICD-10-CM

## 2021-03-04 DIAGNOSIS — Z79890 Hormone replacement therapy (postmenopausal): Secondary | ICD-10-CM

## 2021-03-04 DIAGNOSIS — Z90722 Acquired absence of ovaries, bilateral: Secondary | ICD-10-CM

## 2021-03-04 DIAGNOSIS — Z8742 Personal history of other diseases of the female genital tract: Secondary | ICD-10-CM

## 2021-03-04 DIAGNOSIS — Z01419 Encounter for gynecological examination (general) (routine) without abnormal findings: Secondary | ICD-10-CM | POA: Diagnosis not present

## 2021-03-04 DIAGNOSIS — N644 Mastodynia: Secondary | ICD-10-CM

## 2021-03-04 DIAGNOSIS — Z9189 Other specified personal risk factors, not elsewhere classified: Secondary | ICD-10-CM

## 2021-03-04 DIAGNOSIS — Z9079 Acquired absence of other genital organ(s): Secondary | ICD-10-CM

## 2021-03-04 NOTE — Patient Instructions (Signed)
EXERCISE   We recommended that you start or continue a regular exercise program for good health. Physical activity is anything that gets your body moving, some is better than none. The CDC recommends 150 minutes per week of Moderate-Intensity Aerobic Activity and 2 or more days of Muscle Strengthening Activity.  Benefits of exercise are limitless: helps weight loss/weight maintenance, improves mood and energy, helps with depression and anxiety, improves sleep, tones and strengthens muscles, improves balance, improves bone density, protects from chronic conditions such as heart disease, high blood pressure and diabetes and so much more. To learn more visit: https://www.cdc.gov/physicalactivity/index.html  DIET: Good nutrition starts with a healthy diet of fruits, vegetables, whole grains, and lean protein sources. Drink plenty of water for hydration. Minimize empty calories, sodium, sweets. For more information about dietary recommendations visit: https://health.gov/our-work/nutrition-physical-activity/dietary-guidelines and https://www.myplate.gov/  ALCOHOL:  Women should limit their alcohol intake to no more than 7 drinks/beers/glasses of wine (combined, not each!) per week. Moderation of alcohol intake to this level decreases your risk of breast cancer and liver damage.  If you are concerned that you may have a problem, or your friends have told you they are concerned about your drinking, there are many resources to help. A well-known program that is free, effective, and available to all people all over the nation is Alcoholics Anonymous.  Check out this site to learn more: https://www.aa.org/   CALCIUM AND VITAMIN D:  Adequate intake of calcium and Vitamin D are recommended for bone health.  You should be getting between 1000-1200 mg of calcium and 800 units of Vitamin D daily between diet and supplements  PAP SMEARS:  Pap smears, to check for cervical cancer or precancers,  have traditionally been  done yearly, scientific advances have shown that most women can have pap smears less often.  However, every woman still should have a physical exam from her gynecologist every year. It will include a breast check, inspection of the vulva and vagina to check for abnormal growths or skin changes, a visual exam of the cervix, and then an exam to evaluate the size and shape of the uterus and ovaries. We will also provide age appropriate advice regarding health maintenance, like when you should have certain vaccines, screening for sexually transmitted diseases, bone density testing, colonoscopy, mammograms, etc.   MAMMOGRAMS:  All women over 40 years old should have a routine mammogram.   COLON CANCER SCREENING: Now recommend starting at age 45. At this time colonoscopy is not covered for routine screening until 50. There are take home tests that can be done between 45-49.   COLONOSCOPY:  Colonoscopy to screen for colon cancer is recommended for all women at age 50.  We know, you hate the idea of the prep.  We agree, BUT, having colon cancer and not knowing it is worse!!  Colon cancer so often starts as a polyp that can be seen and removed at colonscopy, which can quite literally save your life!  And if your first colonoscopy is normal and you have no family history of colon cancer, most women don't have to have it again for 10 years.  Once every ten years, you can do something that may end up saving your life, right?  We will be happy to help you get it scheduled when you are ready.  Be sure to check your insurance coverage so you understand how much it will cost.  It may be covered as a preventative service at no cost, but you should check   your particular policy.      Breast Self-Awareness Breast self-awareness means being familiar with how your breasts look and feel. It involves checking your breasts regularly and reporting any changes to your health care provider. Practicing breast self-awareness is  important. A change in your breasts can be a sign of a serious medical problem. Being familiar with how your breasts look and feel allows you to find any problems early, when treatment is more likely to be successful. All women should practice breast self-awareness, including women who have had breast implants. How to do a breast self-exam One way to learn what is normal for your breasts and whether your breasts are changing is to do a breast self-exam. To do a breast self-exam: Look for Changes  Remove all the clothing above your waist. Stand in front of a mirror in a room with good lighting. Put your hands on your hips. Push your hands firmly downward. Compare your breasts in the mirror. Look for differences between them (asymmetry), such as: Differences in shape. Differences in size. Puckers, dips, and bumps in one breast and not the other. Look at each breast for changes in your skin, such as: Redness. Scaly areas. Look for changes in your nipples, such as: Discharge. Bleeding. Dimpling. Redness. A change in position. Feel for Changes Carefully feel your breasts for lumps and changes. It is best to do this while lying on your back on the floor and again while sitting or standing in the shower or tub with soapy water on your skin. Feel each breast in the following way: Place the arm on the side of the breast you are examining above your head. Feel your breast with the other hand. Start in the nipple area and make  inch (2 cm) overlapping circles to feel your breast. Use the pads of your three middle fingers to do this. Apply light pressure, then medium pressure, then firm pressure. The light pressure will allow you to feel the tissue closest to the skin. The medium pressure will allow you to feel the tissue that is a little deeper. The firm pressure will allow you to feel the tissue close to the ribs. Continue the overlapping circles, moving downward over the breast until you feel your  ribs below your breast. Move one finger-width toward the center of the body. Continue to use the  inch (2 cm) overlapping circles to feel your breast as you move slowly up toward your collarbone. Continue the up and down exam using all three pressures until you reach your armpit.  Write Down What You Find  Write down what is normal for each breast and any changes that you find. Keep a written record with breast changes or normal findings for each breast. By writing this information down, you do not need to depend only on memory for size, tenderness, or location. Write down where you are in your menstrual cycle, if you are still menstruating. If you are having trouble noticing differences in your breasts, do not get discouraged. With time you will become more familiar with the variations in your breasts and more comfortable with the exam. How often should I examine my breasts? Examine your breasts every month. If you are breastfeeding, the best time to examine your breasts is after a feeding or after using a breast pump. If you menstruate, the best time to examine your breasts is 5-7 days after your period is over. During your period, your breasts are lumpier, and it may be more   difficult to notice changes. When should I see my health care provider? See your health care provider if you notice: A change in shape or size of your breasts or nipples. A change in the skin of your breast or nipples, such as a reddened or scaly area. Unusual discharge from your nipples. A lump or thick area that was not there before. Pain in your breasts. Anything that concerns you. Breast Tenderness Breast tenderness is a common problem for women of all ages, but may also occur in men. Breast tenderness may range from mild discomfort to severe pain. In women, the pain usually comes and goes with the menstrual cycle, but it canalso be constant. Breast tenderness has many possible causes, including hormone changes,  infections, and taking certain medicines. You may have tests, such as a mammogram or an ultrasound, to check for any unusual findings. Having breasttenderness usually does not mean that you have breast cancer. Follow these instructions at home: Managing pain and discomfort  If directed, put ice to the painful area. To do this: Put ice in a plastic bag. Place a towel between your skin and the bag. Leave the ice on for 20 minutes, 2-3 times a day. Wear a supportive bra, especially during exercise. You may also want to wear a supportive bra while sleeping if your breasts are very tender.  Medicines Take over-the-counter and prescription medicines only as told by your health care provider. If the cause of your pain is infection, you may be prescribed an antibiotic medicine. If you were prescribed an antibiotic, take it as told by your health care provider. Do not stop taking the antibiotic even if you start to feel better. Eating and drinking Your health care provider may recommend that you lessen the amount of fat in your diet. You can do this by: Limiting fried foods. Cooking foods using methods such as baking, boiling, grilling, and broiling. Decrease the amount of caffeine in your diet. Instead, drink more water and choose caffeine-free drinks. General instructions  Keep a log of the days and times when your breasts are most tender. Ask your health care provider how to do breast exams at home. This will help you notice if you have an unusual growth or lump. Keep all follow-up visits as told by your health care provider. This is important.  Contact a health care provider if: Any part of your breast is hard, red, and hot to the touch. This may be a sign of infection. You are a woman and: Not breastfeeding and you have fluid, especially blood or pus, coming out of your nipples. Have a new or painful lump in your breast that remains after your menstrual period ends. You have a fever. Your  pain does not improve or it gets worse. Your pain is interfering with your daily activities. Summary Breast tenderness may range from mild discomfort to severe pain. Breast tenderness has many possible causes, including hormone changes, infections, and taking certain medicines. It can be treated with ice, wearing a supportive bra, and medicines. Make changes to your diet if told to by your health care provider. This information is not intended to replace advice given to you by your health care provider. Make sure you discuss any questions you have with your healthcare provider. Document Revised: 08/14/2018 Document Reviewed: 08/14/2018 Elsevier Patient Education  2022 Elsevier Inc.  

## 2021-03-04 NOTE — Telephone Encounter (Signed)
Order placed at [facility], left message for patient to call so she can schedule with them.

## 2021-03-04 NOTE — Telephone Encounter (Signed)
-----   Message from Romualdo Bolk, MD sent at 03/04/2021 10:30 AM EST ----- She is due for a breast MRI, elevated risk of breast cancer. Please schedule.  Thanks, Noreene Larsson

## 2021-03-06 LAB — CYTOLOGY - PAP
Comment: NEGATIVE
Diagnosis: NEGATIVE
High risk HPV: NEGATIVE

## 2021-03-17 NOTE — Telephone Encounter (Signed)
I called and spoke with patient [facility] has called patient x2 to schedule. Patient said she has missed calls and did have a chance to call them back. Patient said she does still want referral. I provided patient with number to call and schedule.

## 2021-05-12 NOTE — Telephone Encounter (Signed)
I called patient again and asked her if she would like to have MRI. Patient said she does, just had not had time to schedule. I provided patient with the number again to call and schedule.

## 2021-05-14 NOTE — Telephone Encounter (Signed)
Dr.Jertson see the below. [facility] has called patient and I have called patient and gave her the number to call and schedule as well. As of today patient has not scheduled. This message as sent in 02/2021.

## 2021-05-18 NOTE — Telephone Encounter (Signed)
Hayley see the below. Just FYI.

## 2021-05-18 NOTE — Telephone Encounter (Signed)
She will reach out if she needs our assistance. Will close the encounter.

## 2021-07-22 IMAGING — US US BREAST*R* LIMITED INC AXILLA
1 series · 4 of 4 positions shown · non-contrast
Comparison: Previous exam(s).

CLINICAL DATA: Follow-up for probably benign mass in the upper
RIGHT breast. This probably benign mass was originally identified in
7636. History of benign MRI-guided biopsy of the LEFT breast.

EXAM:
DIGITAL DIAGNOSTIC BILATERAL MAMMOGRAM WITH TOMOSYNTHESIS AND CAD;
ULTRASOUND RIGHT BREAST LIMITED
TECHNIQUE: Bilateral digital diagnostic mammography and breast tomosynthesis
was performed. The images were evaluated with computer-aided
detection.; Targeted ultrasound examination of the right breast was
performed

[Series 1: us breast*right* limited inc axilla · 0.05mm/px · 4 of 4 slices shown]
[im 1/4]
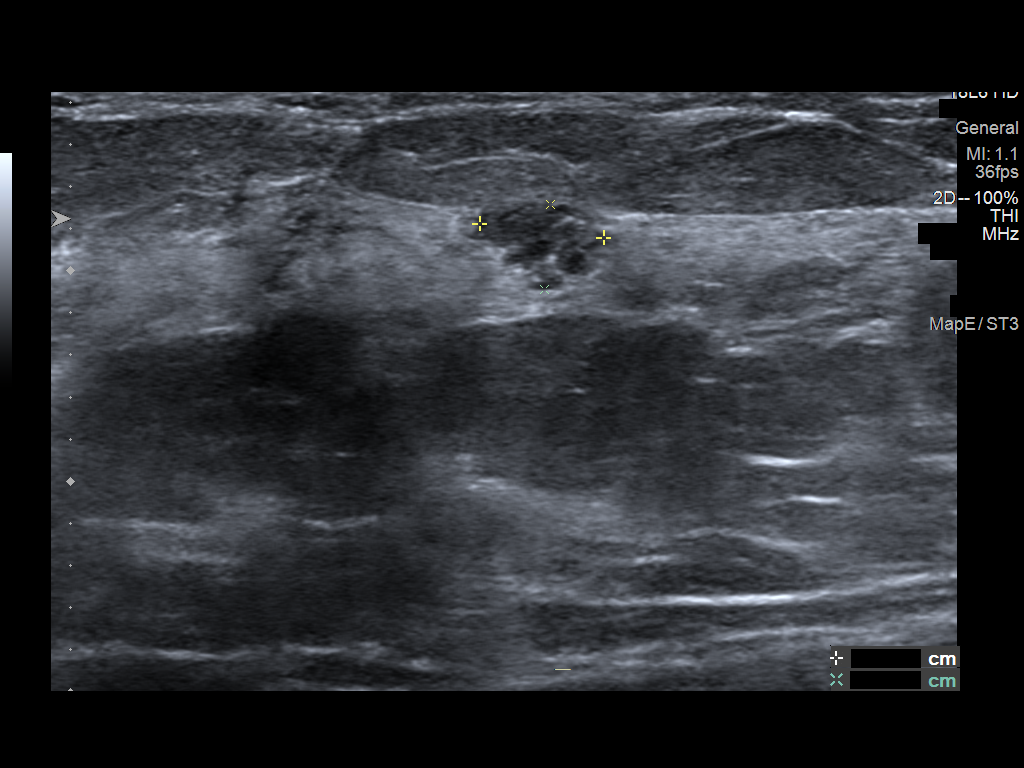
[im 2/4]
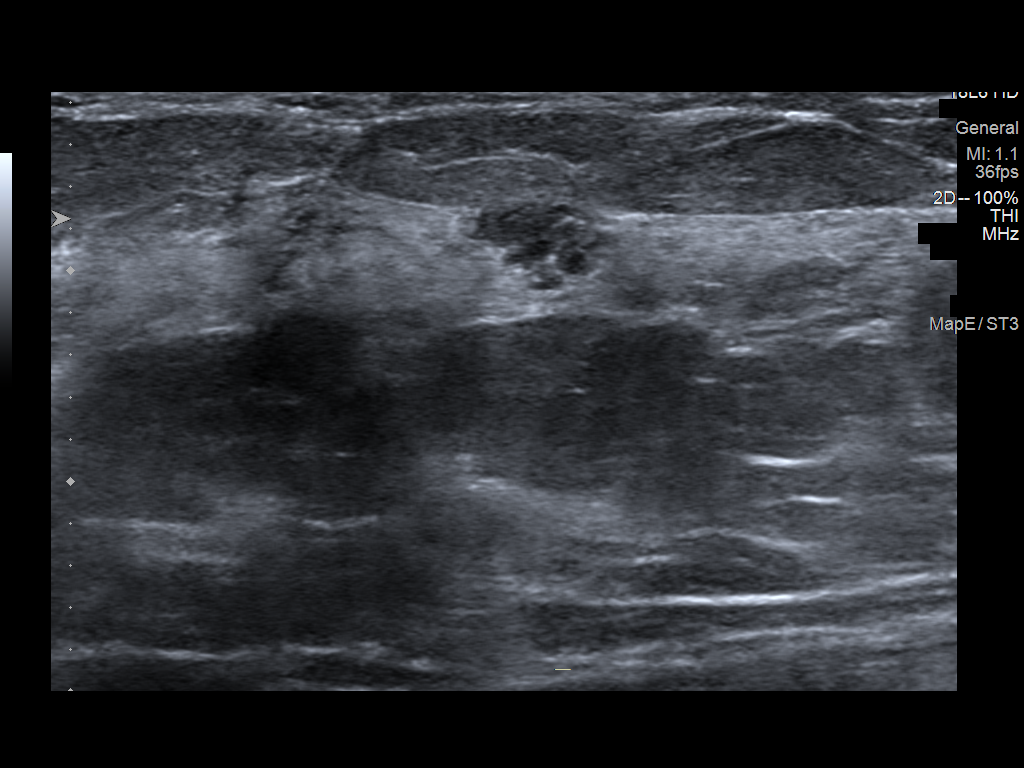
[im 3/4]
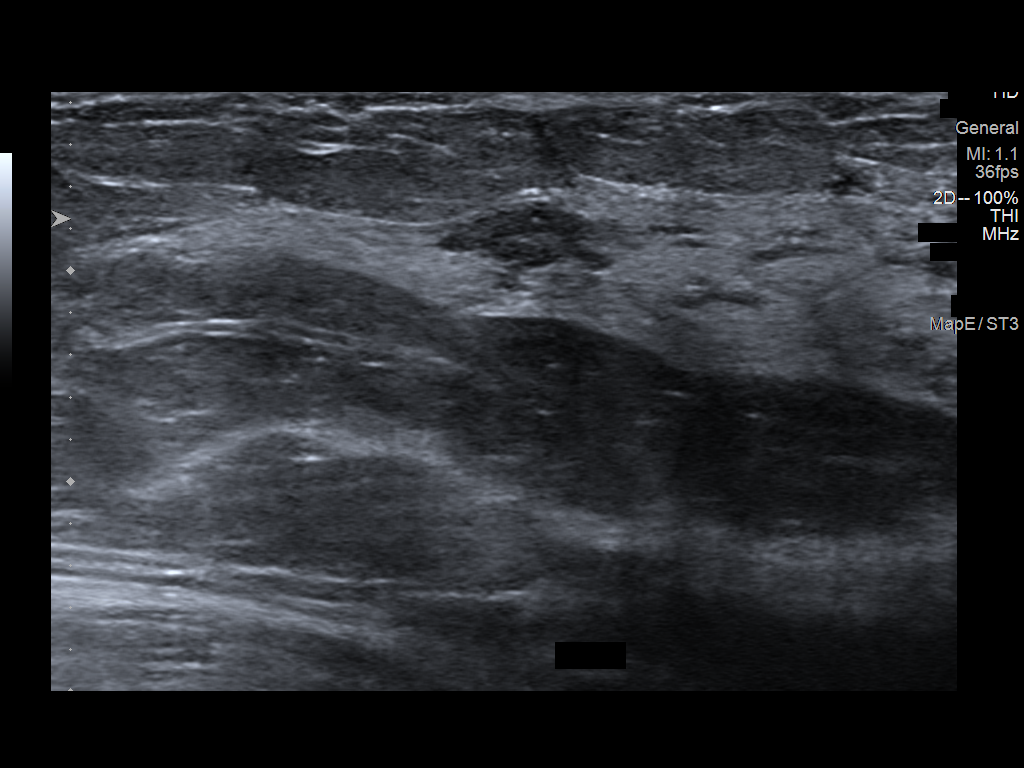
[im 4/4]
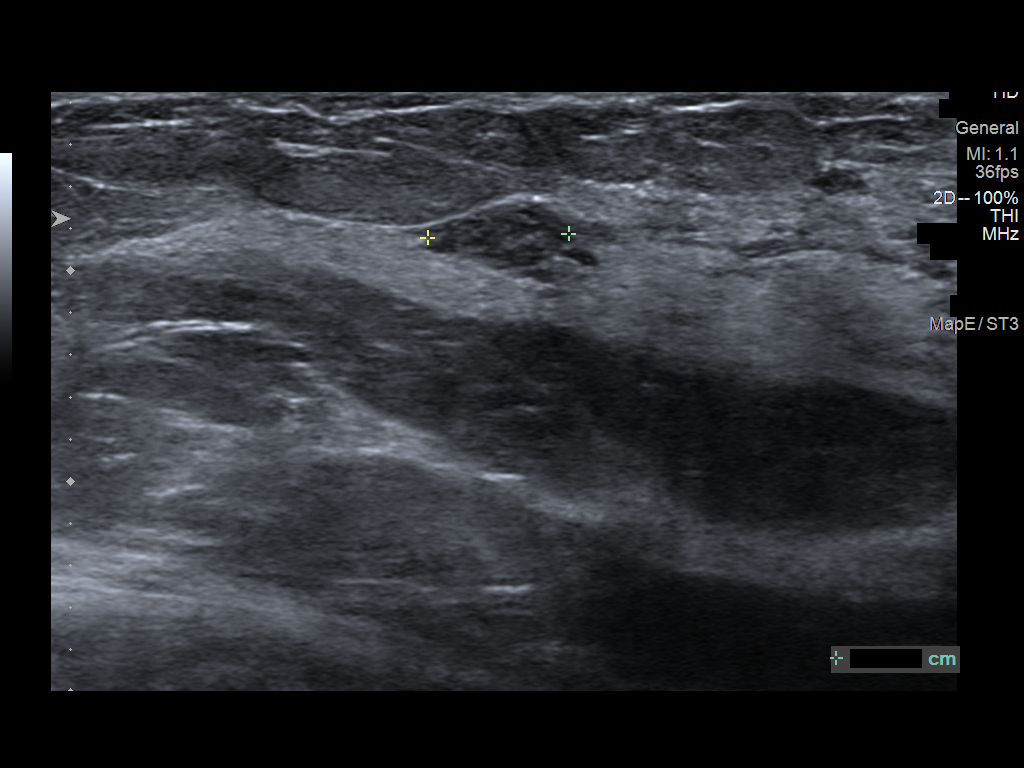

[4 of 4 positions shown; findings below may reference images not displayed]

ACR Breast Density Category c: The breast tissue is heterogeneously
dense, which may obscure small masses.
FINDINGS: Bilateral diagnostic mammogram: There are no new dominant masses,
suspicious calcifications or secondary signs of malignancy within
either breast. Biopsy site marker within the upper-outer quadrant of
the LEFT breast is stable, corresponding to the earlier benign
MRI-guided biopsy.

Targeted ultrasound is performed, again showing an oval hypoechoic
mass in the RIGHT breast at the 12 o'clock axis, 4 cm from the
nipple, measuring 7 mm, now shown stable for greater than 2 years
confirming benignity, presumed benign fibrocystic change.
IMPRESSION: No evidence of malignancy within either breast. Stable mass in the
RIGHT breast at the 12 o'clock axis, now shown stable for greater
than 2 years confirming benignity, presumed benign fibrocystic
change.

Patient may return to routine annual bilateral screening mammogram
schedule.

RECOMMENDATION:
1.  Screening mammogram in one year.(Code:[id number])
2. Annual screening breast MRIs. Per American Cancer Society
guidelines, annual screening MRI of the breasts is recommended if a
risk assessment calculation for breast cancer, preferably using the
Tyrer-Cuzick or Gail model, measures greater than 20%. Earlier MRI
report describes a lifetime risk assessment calculation of 25-30%.

I have discussed the findings and recommendations with the patient.
If applicable, a reminder letter will be sent to the patient
regarding the next appointment.

BI-RADS CATEGORY  2: Benign.

## 2021-12-10 ENCOUNTER — Other Ambulatory Visit: Payer: Self-pay | Admitting: Family Medicine

## 2021-12-10 ENCOUNTER — Other Ambulatory Visit: Payer: Self-pay | Admitting: Obstetrics and Gynecology

## 2021-12-10 DIAGNOSIS — Z9889 Other specified postprocedural states: Secondary | ICD-10-CM

## 2021-12-10 DIAGNOSIS — Z1239 Encounter for other screening for malignant neoplasm of breast: Secondary | ICD-10-CM

## 2021-12-10 DIAGNOSIS — Z9189 Other specified personal risk factors, not elsewhere classified: Secondary | ICD-10-CM

## 2022-03-01 NOTE — Progress Notes (Deleted)
36 y.o. [id number] Married White or Caucasian Not Hispanic or Latino female here for annual exam.      Patient's last menstrual period was 07/03/2013.          Sexually active: {yes no:314532}  The current method of family planning is {contraception:315051}.    Exercising: {yes no:314532}  {types:19826} Smoker:  {YES [id number]  Health Maintenance: Pap:  03/04/21 WNL Hr HPV Neg,  02/13/2018 WNL NEG HPV  History of abnormal Pap:  yes MMG:  Yes CIN 2 BMD:   n/a Colonoscopy: none  TDaP:  03/28/11 Gardasil: none   reports that she quit smoking about 8 years ago. Her smoking use included cigarettes. She has never used smokeless tobacco. She reports that she does not currently use alcohol. She reports that she does not use drugs.  Past Medical History:  Diagnosis Date   Asthma    Complication of anesthesia    Endometriosis    Family history of breast cancer    Family history of prostate cancer    GERD (gastroesophageal reflux disease)    History of kidney infection during pregnancy    PONV (postoperative nausea and vomiting)     Past Surgical History:  Procedure Laterality Date   BREAST BIOPSY Left 05/07/2019   CYSTOSCOPY N/A 10/04/2016   Procedure: CYSTOSCOPY;  Surgeon: Jerene Bears, MD;  Location: [facility];  Service: Gynecology;  Laterality: N/A;   ENDOMETRIAL ABLATION     fatty tumor removed     MANDIBLE FRACTURE SURGERY     REPAIR VAGINAL CUFF N/A 10/16/2016   Procedure: EXAM UNDER ANESTHESIA, REPAIR OF VAGINAL CUFF DEHISCENCE, DRAINGAGE OF PELVIC ABCESS;  Surgeon: Romualdo Bolk, MD;  Location: [facility];  Service: Gynecology;  Laterality: N/A;   TOTAL LAPAROSCOPIC HYSTERECTOMY WITH SALPINGECTOMY N/A 10/04/2016   Procedure: HYSTERECTOMY TOTAL LAPAROSCOPIC WITH BILATERAL SALPINGO-OOPHORECTOMY AND CYSTOSCOPY PERITONEAL BIOPSY;  Surgeon: Jerene Bears, MD;  Location: [facility];  Service: Gynecology;  Laterality: N/A;   WISDOM TOOTH EXTRACTION   12 years ago    Current Outpatient Medications  Medication Sig Dispense Refill   albuterol (PROVENTIL HFA;VENTOLIN HFA) 108 (90 Base) MCG/ACT inhaler Inhale 1-2 puffs into the lungs every 6 (six) hours as needed for wheezing or shortness of breath.     albuterol (PROVENTIL) (2.5 MG/3ML) 0.083% nebulizer solution Take 2.5 mg by nebulization every 4 (four) hours as needed.      amphetamine-dextroamphetamine (ADDERALL) 20 MG tablet Take by mouth.     estradiol (VIVELLE-DOT) 0.05 MG/24HR patch Place 1 patch onto the skin 2 (two) times a week.     fish oil-omega-3 fatty acids 1000 MG capsule Take by mouth.     montelukast (SINGULAIR) 10 MG tablet Take 1 tablet (10 mg total) by mouth at bedtime. 90 tablet 4   Multiple Vitamin (MULTIVITAMIN ADULT PO) Take by mouth.     progesterone (PROMETRIUM) 100 MG capsule Take by mouth.     No current facility-administered medications for this visit.    Family History  Problem Relation Age of Onset   Hypertension Father    Asthma Sister    Diabetes Paternal Grandfather    Diabetes Maternal Grandmother    Breast cancer Maternal Grandmother        30's   Breast cancer Mother 30   Lymphoma Paternal Grandmother        Non Hodgskins Lymphoma   Breast cancer Other 37   Lymphoma Other 25   Breast cancer Other 74  Anesthesia problems Neg Hx    Hypotension Neg Hx    Malignant hyperthermia Neg Hx    Pseudochol deficiency Neg Hx     Review of Systems  Exam:   LMP 07/03/2013   Weight change: @WEIGHTCHANGE @ Height:      Ht Readings from Last 3 Encounters:  03/04/21 5\' 8"  (1.727 m)  02/20/20 5\' 7"  (1.702 m)  03/21/19 5\' 8"  (1.727 m)    General appearance: alert, cooperative and appears stated age Head: Normocephalic, without obvious abnormality, atraumatic Neck: no adenopathy, supple, symmetrical, trachea midline and thyroid {CHL AMB PHY EX THYROID NORM DEFAULT:[phone]::"normal to inspection and palpation"} Lungs: clear to auscultation  bilaterally Cardiovascular: regular rate and rhythm Breasts: {Exam; breast:13139::"normal appearance, no masses or tenderness"} Abdomen: soft, non-tender; non distended,  no masses,  no organomegaly Extremities: extremities normal, atraumatic, no cyanosis or edema Skin: Skin color, texture, turgor normal. No rashes or lesions Lymph nodes: Cervical, supraclavicular, and axillary nodes normal. No abnormal inguinal nodes palpated Neurologic: Grossly normal   Pelvic: External genitalia:  no lesions              Urethra:  normal appearing urethra with no masses, tenderness or lesions              Bartholins and Skenes: normal                 Vagina: normal appearing vagina with normal color and discharge, no lesions              Cervix: {CHL AMB PHY EX CERVIX NORM DEFAULT:[phone]::"no lesions"}               Bimanual Exam:  Uterus:  {CHL AMB PHY EX UTERUS NORM DEFAULT:[phone]::"normal size, contour, position, consistency, mobility, non-tender"}              Adnexa: {CHL AMB PHY EX ADNEXA NO MASS DEFAULT:[id number]::"no mass, fullness, tenderness"}               Rectovaginal: Confirms               Anus:  normal sphincter tone, no lesions  *** chaperoned for the exam.  A:  Well Woman with normal exam  P:

## 2022-03-10 ENCOUNTER — Ambulatory Visit: Payer: 59 | Admitting: Obstetrics and Gynecology
